# Patient Record
Sex: Male | Born: 1950
Health system: Southern US, Community
[De-identification: ages and names within clinical notes are randomized; demographics above are authoritative.]

## PROBLEM LIST (undated history)

## (undated) DIAGNOSIS — R55 Syncope and collapse: Secondary | ICD-10-CM

## (undated) DIAGNOSIS — E785 Hyperlipidemia, unspecified: Secondary | ICD-10-CM

## (undated) DIAGNOSIS — T753XXA Motion sickness, initial encounter: Secondary | ICD-10-CM

## (undated) DIAGNOSIS — I10 Essential (primary) hypertension: Secondary | ICD-10-CM

## (undated) DIAGNOSIS — I495 Sick sinus syndrome: Secondary | ICD-10-CM

## (undated) DIAGNOSIS — Z972 Presence of dental prosthetic device (complete) (partial): Secondary | ICD-10-CM

## (undated) DIAGNOSIS — Z950 Presence of cardiac pacemaker: Secondary | ICD-10-CM

## (undated) DIAGNOSIS — I4891 Unspecified atrial fibrillation: Secondary | ICD-10-CM

## (undated) HISTORY — DX: Syncope and collapse: R55

## (undated) HISTORY — DX: Sick sinus syndrome: I49.5

## (undated) HISTORY — DX: Essential (primary) hypertension: I10

## (undated) HISTORY — DX: Presence of cardiac pacemaker: Z95.0

## (undated) HISTORY — DX: Hyperlipidemia, unspecified: E78.5

---

## 2005-01-22 ENCOUNTER — Ambulatory Visit: Payer: Self-pay | Admitting: General Surgery

## 2007-07-29 ENCOUNTER — Other Ambulatory Visit: Payer: Self-pay

## 2007-07-29 ENCOUNTER — Emergency Department: Payer: Self-pay | Admitting: Internal Medicine

## 2007-07-31 ENCOUNTER — Ambulatory Visit: Payer: Self-pay | Admitting: Internal Medicine

## 2007-07-31 LAB — CONVERTED CEMR LAB
CO2: 23 meq/L (ref 19–32)
Chloride: 101 meq/L (ref 96–112)
Creatinine, Ser: 1.05 mg/dL (ref 0.40–1.50)
Free T4: 1.19 ng/dL (ref 0.89–1.80)
Potassium: 3.6 meq/L (ref 3.5–5.3)

## 2007-08-05 ENCOUNTER — Ambulatory Visit: Payer: Self-pay

## 2007-08-05 ENCOUNTER — Encounter: Payer: Self-pay | Admitting: Internal Medicine

## 2007-08-25 ENCOUNTER — Ambulatory Visit: Payer: Self-pay | Admitting: Internal Medicine

## 2007-08-28 ENCOUNTER — Ambulatory Visit: Payer: Self-pay | Admitting: Internal Medicine

## 2007-08-28 LAB — CONVERTED CEMR LAB
Basophils Absolute: 0 10*3/uL (ref 0.0–0.1)
Basophils Relative: 0 % (ref 0.0–1.0)
CO2: 29 meq/L (ref 19–32)
Calcium: 9.8 mg/dL (ref 8.4–10.5)
Chloride: 105 meq/L (ref 96–112)
Creatinine, Ser: 1 mg/dL (ref 0.4–1.5)
Eosinophils Relative: 1.3 % (ref 0.0–5.0)
Glucose, Bld: 104 mg/dL — ABNORMAL HIGH (ref 70–99)
Hemoglobin: 15.4 g/dL (ref 13.0–17.0)
INR: 1 (ref 0.8–1.0)
Lymphocytes Relative: 50.1 % — ABNORMAL HIGH (ref 12.0–46.0)
Monocytes Relative: 6.9 % (ref 3.0–12.0)
Neutro Abs: 3.9 10*3/uL (ref 1.4–7.7)
Neutrophils Relative %: 41.7 % — ABNORMAL LOW (ref 43.0–77.0)
Prothrombin Time: 12.5 s (ref 10.9–13.3)
RBC: 4.91 M/uL (ref 4.22–5.81)
WBC: 9.4 10*3/uL (ref 4.5–10.5)
aPTT: 28.3 s (ref 21.7–29.8)

## 2007-09-04 ENCOUNTER — Ambulatory Visit: Payer: Self-pay | Admitting: Internal Medicine

## 2007-09-04 ENCOUNTER — Ambulatory Visit (HOSPITAL_COMMUNITY): Admission: RE | Admit: 2007-09-04 | Discharge: 2007-09-05 | Payer: Self-pay | Admitting: Internal Medicine

## 2007-09-04 HISTORY — PX: INSERT / REPLACE / REMOVE PACEMAKER: SUR710

## 2007-09-18 ENCOUNTER — Ambulatory Visit: Payer: Self-pay

## 2007-09-30 ENCOUNTER — Ambulatory Visit: Payer: Self-pay | Admitting: Internal Medicine

## 2007-11-28 ENCOUNTER — Ambulatory Visit: Payer: Self-pay | Admitting: Internal Medicine

## 2008-01-05 ENCOUNTER — Ambulatory Visit: Payer: Self-pay | Admitting: Internal Medicine

## 2008-08-27 ENCOUNTER — Encounter (INDEPENDENT_AMBULATORY_CARE_PROVIDER_SITE_OTHER): Payer: Self-pay | Admitting: *Deleted

## 2008-09-20 ENCOUNTER — Ambulatory Visit: Payer: Self-pay | Admitting: Internal Medicine

## 2008-11-25 ENCOUNTER — Ambulatory Visit: Payer: Self-pay | Admitting: Internal Medicine

## 2008-11-25 DIAGNOSIS — E785 Hyperlipidemia, unspecified: Secondary | ICD-10-CM | POA: Insufficient documentation

## 2008-11-25 DIAGNOSIS — I10 Essential (primary) hypertension: Secondary | ICD-10-CM | POA: Insufficient documentation

## 2008-11-25 DIAGNOSIS — I495 Sick sinus syndrome: Secondary | ICD-10-CM

## 2008-11-25 DIAGNOSIS — I4891 Unspecified atrial fibrillation: Secondary | ICD-10-CM

## 2008-12-22 ENCOUNTER — Ambulatory Visit: Payer: Self-pay | Admitting: Internal Medicine

## 2008-12-22 ENCOUNTER — Encounter: Payer: Self-pay | Admitting: Internal Medicine

## 2008-12-27 ENCOUNTER — Encounter: Payer: Self-pay | Admitting: Internal Medicine

## 2009-03-29 ENCOUNTER — Encounter: Payer: Self-pay | Admitting: Internal Medicine

## 2009-04-04 ENCOUNTER — Ambulatory Visit: Payer: Self-pay | Admitting: Internal Medicine

## 2009-04-11 ENCOUNTER — Encounter: Payer: Self-pay | Admitting: Internal Medicine

## 2009-06-03 ENCOUNTER — Ambulatory Visit: Payer: Self-pay | Admitting: Internal Medicine

## 2009-07-18 ENCOUNTER — Encounter: Payer: Self-pay | Admitting: Internal Medicine

## 2009-07-26 ENCOUNTER — Ambulatory Visit: Payer: Self-pay | Admitting: Internal Medicine

## 2009-08-10 ENCOUNTER — Encounter: Payer: Self-pay | Admitting: Internal Medicine

## 2009-10-11 ENCOUNTER — Ambulatory Visit: Payer: Self-pay | Admitting: Internal Medicine

## 2009-10-11 DIAGNOSIS — Z950 Presence of cardiac pacemaker: Secondary | ICD-10-CM

## 2009-10-11 HISTORY — DX: Presence of cardiac pacemaker: Z95.0

## 2010-01-13 ENCOUNTER — Encounter: Payer: Self-pay | Admitting: Internal Medicine

## 2010-01-30 ENCOUNTER — Ambulatory Visit: Payer: Self-pay | Admitting: Internal Medicine

## 2010-02-15 ENCOUNTER — Encounter: Payer: Self-pay | Admitting: Internal Medicine

## 2010-02-24 ENCOUNTER — Ambulatory Visit: Payer: Self-pay | Admitting: Internal Medicine

## 2010-05-11 ENCOUNTER — Encounter: Payer: Self-pay | Admitting: Internal Medicine

## 2010-05-11 ENCOUNTER — Ambulatory Visit
Admission: RE | Admit: 2010-05-11 | Discharge: 2010-05-11 | Payer: Self-pay | Source: Home / Self Care | Attending: Internal Medicine | Admitting: Internal Medicine

## 2010-05-17 ENCOUNTER — Encounter (INDEPENDENT_AMBULATORY_CARE_PROVIDER_SITE_OTHER): Payer: Self-pay | Admitting: *Deleted

## 2010-06-06 NOTE — Assessment & Plan Note (Signed)
Summary: ROV/AMD   Visit Type:  Follow-up Primary Provider:  Wallie Char  CC:  Denies chest pain or shortness of breath..  History of Present Illness: [name] returns today for followup.  He is a delightful 60 year old male with a history of hypertension, atrial fibrillation c/b tachy-brady syndrome s/p pacemaker.  ETT 08/2007 with excellent exercise capacity no evidence of ischemia.  Returns for routine f/u. Doing very well. Playing softball. Walking on treadmill for 30 mins 3x/week without problems.  Denies palpitations, SOB, CP or HF symptoms. Pacer being followed in pacer clinic and telrphonically.   Current Medications (verified): 1)  Dilt-Xr 180 Mg Xr24h-Cap (Diltiazem Hcl) .... Take 1 Capsule By Mouth Once A Day 2)  Lipitor 20 Mg Tabs (Atorvastatin Calcium) .... Take One Tablet By Mouth Daily. 3)  Aspirin 81 Mg Tbec (Aspirin) .... Take One Tablet By Mouth Daily 4)  Fish Oil 1000 Mg Caps (Omega-3 Fatty Acids) .... Take 1 By Mouth Two Times A Day  Allergies (verified): No Known Drug Allergies  Past History:  Past Medical History: Last updated: 11/25/2008 Paroxysmal atrial fib c/b sick sinus syndrome s/p pacer Syncope  Hypertension Hyperlipidemia  Past Surgical History: Last updated: 09/20/2008 Pacemaker Medtronic 09/04/2007  Family History: Last updated: 09/20/2008 Mother: Diabetes, and heart problems Father: heart attack  Social History: Last updated: 09/20/2008 Tobacco Use - No.  Alcohol Use - no Regular Exercise - yes 2 children  Risk Factors: Exercise: yes (09/20/2008)  Risk Factors: Smoking Status: never (09/20/2008)  Review of Systems       As per HPI and past medical history; otherwise all systems negative.   Vital Signs:  Patient profile:   60 year old male Height:      60 inches Weight:      207 pounds BMI:     40.57 Pulse rate:   67 / minute BP sitting:   130 / 78  (left arm) Cuff size:   regular  Vitals Entered By: Bishop Dublin,  CMA (February 24, 2010 4:05 PM)  Physical Exam  General:  Well appearing. no resp difficulty HEENT: normal Neck: supple. no JVD. Carotids 2+ bilat; not bruits. No lymphadenopathy or thryomegaly appreciated. Cor: PMI nondisplaced. Regular rate & rhythm. No rubs, gallops, murmur. Lungs: clear bilaterally Abdomen: soft, nontender, nondistended. No hepatosplenomegaly. No bruits or masses. Good bowel sounds. Extremities: no cyanosis, clubbing, rash, edema Neuro: alert & orientedx3, cranial nerves grossly intact. moves all 4 extremities w/o difficulty. affect pleasant    PPM Specifications Following MD:  Arvilla Meres     PPM Vendor:  Medtronic     PPM Model Number:  [id number]     PPM Serial Number:  [id number] PPM DOI:  09/04/2007     PPM Implanting MD:  Lewayne Bunting, MD  Lead 1    Location: RA     DOI: 09/04/2007     Model #: [id number]     Serial #: [id number]     Status: active Lead 2    Location: RV     DOI: 09/04/2007     Model #: [id number]     Serial #: [id number]     Status: active   Indications:  Huston Foley; A-fib; sick sinus syndrome   PPM Follow Up Pacer Dependent:  Yes      Episodes Coumadin:  No  Parameters Mode:  DDDR+     Lower Rate Limit:  60     Upper Rate Limit:  130 Paced AV Delay:  180  Sensed AV Delay:  150  Impression & Recommendations:  Problem # 1:  SICK SINUS/ TACHY-BRADY SYNDROME (ICD-427.81) Doing well. Asymptomatic. Continue diltiazem.  Problem # 2:  ATRIAL FIBRILLATION (ICD-427.31) Maintianing SR. (CHADS2 = 1) Continue ASA.   Other Orders: EKG w/ Interpretation (93000)  Patient Instructions: 1)  Your physician recommends that you continue on your current medications as directed. Please refer to the Current Medication list given to you today. 2)  Your physician wants you to follow-up in:   12 months You will receive a reminder letter in the mail two months in advance. If you don't receive a letter, please call our office to schedule the follow-up  appointment. Prescriptions: DILT-XR 180 MG XR24H-CAP (DILTIAZEM HCL) Take 1 capsule by mouth once a day  #30 x 12   Entered by:   Benedict Needy, RN   Authorized by:   Dolores Patty, MD, [facility]   Signed by:   Benedict Needy, RN on 02/24/2010   Method used:   Electronically to        [facility] [phone]* (retail)       [address]       [place], [place]  [address]       Ph: [phone]       Fax: [phone]   RxID:   [id number]   Prevention & Chronic Care Immunizations   Influenza vaccine: Not documented    Tetanus booster: Not documented    Pneumococcal vaccine: Not documented  Colorectal Screening   Hemoccult: Not documented    Colonoscopy: Not documented  Other Screening   PSA: Not documented   Smoking status: never  (09/20/2008)  Lipids   Total Cholesterol: Not documented   LDL: Not documented   LDL Direct: Not documented   HDL: Not documented   Triglycerides: Not documented    SGOT (AST): Not documented   SGPT (ALT): Not documented   Alkaline phosphatase: Not documented   Total bilirubin: Not documented  Hypertension   Last Blood Pressure: 130 / 78  (02/24/2010)   Serum creatinine: 1.0  (08/28/2007)   Serum potassium 4.2  (08/28/2007)  Self-Management Support :    Hypertension self-management support: Not documented    Lipid self-management support: Not documented

## 2010-06-06 NOTE — Letter (Signed)
Summary: Device-Delinquent Phone Journalist, newspaper, Main Office  [address]   [place], [place] [address]   Phone: [phone]  Fax: [phone]     January 13, 2010 MRN: [id number]   BRENTTON WARDLOW [address] [place], [place]  [address]   Dear Mr. Eaves,  According to our records, you were scheduled for a device phone transmission on 01-12-2010.     We did not receive any results from this check.  If you transmitted on your scheduled day, please call us to help troubleshoot your system.  If you forgot to send your transmission, please send one upon receipt of this letter.  Thank you,   Architectural technologist [facility]

## 2010-06-06 NOTE — Letter (Signed)
Summary: Remote Device Check  [employer], Main Office  [address]   [place], [place] [address]   Phone: [phone]  Fax: [phone]     February 15, 2010 MRN: [id number]   TIMONTHY HOVATER [address] [place], [place]  [address]   Dear Mr. Alvelo,   Your remote transmission was recieved and reviewed by your physician.  All diagnostics were within normal limits for you.  __X___Your next transmission is scheduled for: 05-11-2010.  Please transmit at any time this day.  If you have a wireless device your transmission will be sent automatically.      Sincerely,  Vella Kohler

## 2010-06-06 NOTE — Cardiovascular Report (Signed)
Summary: Office Visit Remote   Office Visit Remote   Imported By: Roderic Ovens 08/12/2009 14:56:58  _____________________________________________________________________  External Attachment:    Type:   Image     Comment:   External Document

## 2010-06-06 NOTE — Assessment & Plan Note (Signed)
Summary: f1y   Visit Type:  1 yr f/u Primary Provider:  Wallie Char   History of Present Illness: [name] returns today for followup.  He is a delightful 60 year old male with a history of hypertension, atrial fibrillation c/b tachy-brady syndrome s/p pacemaker.  Returns for routine f/u.Doing very well. Not exercising as much as he was in the past. Denies palpitations, SOB, CP or HF symptoms. He remains active cutting grass and working in his yard.  Allergies: No Known Drug Allergies  Past History:  Past Medical History: Last updated: 11/25/2008 Paroxysmal atrial fib c/b sick sinus syndrome s/p pacer Syncope  Hypertension Hyperlipidemia  Past Surgical History: Last updated: 09/20/2008 Pacemaker Medtronic 09/04/2007  Review of Systems  The patient denies chest pain, syncope, dyspnea on exertion, and peripheral edema.    Physical Exam  General:  Gen: well appearing. no resp difficulty HEENT: normal Neck: supple. no JVD. Carotids 2+ bilat; not bruits. No lymphadenopathy or thryomegaly appreciated. Cor: PMI nondisplaced. Regular rate & rhythm. No rubs, gallops, murmur. Lungs: clear bilaterally with a well healed PPM incision. Abdomen: soft, nontender, nondistended. No hepatosplenomegaly. No bruits or masses. Good bowel sounds. Extremities: no cyanosis, clubbing, rash, edema Neuro: alert & orientedx3, cranial nerves grossly intact. moves all 4 extremities w/o difficulty. affect pleasant    PPM Specifications Following MD:  Arvilla Meres     PPM Vendor:  Medtronic     PPM Model Number:  [id number]     PPM Serial Number:  [id number] PPM DOI:  09/04/2007     PPM Implanting MD:  Lewayne Bunting, MD  Lead 1    Location: RA     DOI: 09/04/2007     Model #: [id number]     Serial #: [id number]     Status: active Lead 2    Location: RV     DOI: 09/04/2007     Model #: [id number]     Serial #: [id number]     Status: active   Indications:  Huston Foley; A-fib; sick sinus syndrome   PPM Follow  Up Remote Check?  No Battery Voltage:  2.79 V     Battery Est. Longevity:  11 years     Pacer Dependent:  Yes       PPM Device Measurements Atrium  Amplitude: 2.0 mV, Impedance: 532 ohms, Threshold: 0.5 V at 0.4 msec Right Ventricle  Amplitude: 16 mV, Impedance: 591 ohms, Threshold: 0.5 V at 0.4 msec  Episodes MS Episodes:  763     Percent Mode Switch:  <0.1%     Coumadin:  No Ventricular High Rate:  85     Atrial Pacing:  92.4%     Ventricular Pacing:  0.2%  Parameters Mode:  DDDR+     Lower Rate Limit:  60     Upper Rate Limit:  130 Paced AV Delay:  180     Sensed AV Delay:  150 Next Remote Date:  01/12/2010     Next Cardiology Appt Due:  10/06/2010 Tech Comments:  No parameter changes.  Device function normal.  [facility]link transmissions every 3 months.  ROV 1 year with Dr. Ladona Ridgel in [place]. Altha Harm, LPN  October 11, 6299 12:13 PM  MD Comments:  Agree with above.  Impression & Recommendations:  Problem # 1:  CARDIAC PACEMAKER IN SITU (ICD-V45.01) His pacemaker is working normally.  Will recheck in several months.  Problem # 2:  HYPERLIPIDEMIA-MIXED (ICD-272.4) Continue current meds. A low fat diet is requested. His updated medication  list for this problem includes:    Lipitor 20 Mg Tabs (Atorvastatin calcium) .Marland Kitchen... Take one tablet by mouth daily.  Problem # 3:  ATRIAL FIBRILLATION (ICD-427.31) His symptoms are well controlled.  He will remain on ASA. His updated medication list for this problem includes:    Aspirin 81 Mg Tbec (Aspirin) .Marland Kitchen... Take one tablet by mouth daily  Patient Instructions: 1)  Your physician recommends that you schedule a follow-up appointment in: 1 year 2)  Your physician recommends that you continue on your current medications as directed. Please refer to the Current Medication list given to you today.

## 2010-06-06 NOTE — Letter (Signed)
Summary: Remote Device Check  [employer], Main Office  [address]   [place], [place] [address]   Phone: [phone]  Fax: [phone]     August 10, 2009 MRN: [id number]   MAVERYCK BAHRI [address] [place], [place]  [address]   Dear Mr. Hilliker,   Your remote transmission was recieved and reviewed by your physician.  All diagnostics were within normal limits for you.    ___X___Your next office visit is scheduled for: MAY 2011 WITH DR Ladona Ridgel. Please call our office to schedule an appointment.    Sincerely,  Proofreader

## 2010-06-06 NOTE — Cardiovascular Report (Signed)
Summary: Office Visit Remote   Office Visit Remote   Imported By: Roderic Ovens 02/16/2010 11:25:10  _____________________________________________________________________  External Attachment:    Type:   Image     Comment:   External Document

## 2010-06-06 NOTE — Letter (Signed)
Summary: Device-Delinquent Phone Journalist, newspaper, Main Office  [address]   [place], [place] [address]   Phone: [phone]  Fax: [phone]     July 18, 2009 MRN: [id number]   Max Lozano [address] [place], [place]  [address]   Dear [name],  According to our records, you were scheduled for a device phone transmission on July 06, 2009.     We did not receive any results from this check.  If you transmitted on your scheduled day, please call us to help troubleshoot your system.  If you forgot to send your transmission, please send one upon receipt of this letter.  Thank you,   Architectural technologist [facility]

## 2010-06-08 NOTE — Cardiovascular Report (Signed)
Summary: Office Visit Remote   Office Visit Remote   Imported By: Roderic Ovens 05/26/2010 10:55:57  _____________________________________________________________________  External Attachment:    Type:   Image     Comment:   External Document

## 2010-06-08 NOTE — Letter (Signed)
Summary: Remote Device Check  [employer], Main Office  [address]   [place], [place] [address]   Phone: [phone]  Fax: [phone]     May 17, 2010 MRN: [id number]   Max Lozano [address] [place], [place]  [address]   Dear [name],   Your remote transmission was recieved and reviewed by your physician.  All diagnostics were within normal limits for you.  __X___Your next transmission is scheduled for:  08-10-10.  Please transmit at any time this day.  If you have a wireless device your transmission will be sent automatically.   Sincerely,  Vella Kohler

## 2010-08-10 ENCOUNTER — Encounter: Payer: Self-pay | Admitting: *Deleted

## 2010-08-15 ENCOUNTER — Encounter: Payer: Self-pay | Admitting: *Deleted

## 2010-09-01 ENCOUNTER — Ambulatory Visit (INDEPENDENT_AMBULATORY_CARE_PROVIDER_SITE_OTHER): Payer: BC Managed Care – PPO | Admitting: *Deleted

## 2010-09-01 ENCOUNTER — Other Ambulatory Visit: Payer: Self-pay | Admitting: Internal Medicine

## 2010-09-01 DIAGNOSIS — I495 Sick sinus syndrome: Secondary | ICD-10-CM

## 2010-09-01 DIAGNOSIS — I4891 Unspecified atrial fibrillation: Secondary | ICD-10-CM

## 2010-09-01 DIAGNOSIS — Z950 Presence of cardiac pacemaker: Secondary | ICD-10-CM

## 2010-09-14 NOTE — Progress Notes (Signed)
Pacer remote check  

## 2010-09-19 NOTE — Discharge Summary (Signed)
NAME:  AYUB, KIRSH NO.:  [id number]   MEDICAL RECORD NO.:  [id number]          PATIENT TYPE:  OIB   LOCATION:  6529                         FACILITY:  [facility]   PHYSICIAN:  Maple Mirza, PA   DATE OF BIRTH:  Sep 01, 1950   DATE OF ADMISSION:  09/04/2007  DATE OF DISCHARGE:  09/05/2007                               DISCHARGE SUMMARY   ALLERGIES:  This patient has no known drug allergies.   DICTATION AND TIME FOR EXAM:  Greater than 35 minutes.   FINAL DIAGNOSES:  1. Discharging day 1 status post implant of a Medtronic Adapta dual-      chamber pacemaker.  2. History of presyncope.  3. Currently atrial pacing at 60 beats per minute.  4. History of paroxysmal atrial fibrillation with rapid ventricular      rate and pauses when wearing a monitor.  5. Bradycardia on beta-blockers.   SECONDARY DIAGNOSES:  1. Dyslipidemia.  2. Hypertension.  3. Obesity with recent weight loss.   NOTE:  Ultimately may need flecainide.   PROCEDURE:  August 18, 2007, implant of a Medtronic dual-chamber  pacemaker, Ds. Lewayne Bunting.  No postprocedural complications.  The  pacemaker interrogation well postprocedure day #1.  The x-ray shows the  leads are in appropriate position.  There is no hematoma.   BRIEF HISTORY:  Mr. Voller is a 60 year old male.  He has episodes of  dizziness.  He did have an episode of syncope.  The patient does have  atrial fibrillation wearing a monitor.  These include heart rates up to  180 beats per minute.  He also has pauses of greater than 3 seconds.  This happened when the patient was on a low-dose beta-blocker.   ADDITIONAL PAST MEDICAL HISTORY:  1. Dyslipidemia.  2. Hypertension.  3. Obesity, although the patient has lost over 50 pounds by      exercising.   TREATMENT OPTIONS:  Pacemaker insertion.  The patient was to proceed.   HOSPITAL COURSE:  The patient presents electively, September 04, 2007.  He  underwent implantation of the  dual-chamber pacemaker by Dr. Ladona Ridgel.  He  has had no postprocedural complications as mentioned.  Mobility of the  left arm has been discussed with the patient.  He is asked to keep his  incision dry for the next 7 days and not to drive for the next 7 days.   MEDICATIONS AT DISCHARGE:  1. Enteric-coated aspirin 81 mg daily.  2. Niacin 500 mg twice daily.  3. Cozaar 50 mg daily.   He has to follow up at [facility] on [address]  1. Pacer clinic Thursday, Sep 18, 2007, at 9:40.  2. Dr. Gala Romney Friday, November 28, 2007, at 10:30.  3. Dr. Ladona Ridgel Tuesday, December 30, 2007, at 2:50 p.m.   LABORATORY STUDIES:  Pertinent to this admission, white cells 9.4,  hemoglobin 15.4, hematocrit 45.3, and platelets are 115,000.  Pro time  12.5, INR 1.0, sodium 130, potassium 4.2, chloride 105, carbonate 29,  glucose 104, BUN is 15, and creatinine is 1.  Maple Mirza, PA     GM/MEDQ  D:  09/05/2007  T:  09/05/2007  Job:  [id number]   cc:   Doylene Canning. Ladona Ridgel, MD  Bevelyn Buckles. Bensimhon, MD  Mila Merry

## 2010-09-19 NOTE — Progress Notes (Signed)
[facility] OFF[facility] NOTE   Max Lozano, Max Lozano                        MRN:          [id number]  DATE:09/30/2007                            DOB:          18-Oct-1950    Max Lozano returns today for follow-up.  He is a very pleasant middle-  aged male with symptomatic tachy-brady syndrome and paroxysmal atrial  fibrillation.  He returns today for follow-up.  He notes that after his  pacemaker was placed he still has some palpitations and was placed on  Cardizem by Dr. Gala Romney and returns today for additional evaluation  and treatment.  He has been stable.  He had no dizzy spells or  lightheaded spells, and he has very rare if any palpitations now that he  has started diltiazem.   MEDICATIONS:  Include:  1. Niacin 1 g twice daily.  2. Aspirin 81 daily.  3. Lisinopril 20 a day.  4. Diltiazem 180 a day.   PHYSICAL EXAMINATION:  Is notable for him being a pleasant, well-  appearing man in no distress.  Blood pressure was 140/70, the pulse 62  and regular, respirations were 18.  Weight was 200 pounds.  NECK:  Revealed no jugular venous distention.  LUNGS:  Clear bilaterally to auscultation.  No wheezes, rales or rhonchi  are present.  CARDIOVASCULAR EXAM:  Revealed a regular rate and rhythm.  Normal S1-S2.  There are no murmurs, rubs or gallops that I could appreciate.  The  pacemaker incision was healed nicely.  ABDOMINAL EXAM:  Soft, nontender.  EXTREMITIES:  Demonstrated no edema.   Interrogation of his pacemaker demonstrates a Medtronic Adapta.  The P  and R waves were 2 and 15, respectively.  The impedance 630 in the A,  699 in the V, threshold 0.5 at 0.4 in the right atrium and right  ventricle.  The battery voltage was 2.79 volts. The patient's head was  mode switched at approximately 8% of the time.  He was 94% A paced, less  than 1% V paced.  Today, we turned his outputs down to 2 at 0.4 in the  A  and 2.5 at 0.4 in the V.   IMPRESSION:  1. Paroxysmal atrial fibrillation.  2. Palpitations secondary to #1.  3. Bradycardia.  4. Status post pacemaker insertion.   DISCUSSION:  Overall, Max Lozano is stable.  His atrial fibrillation is  fairly well-controlled on Cardizem.  His CHADS score is 1.  For this  reason, I would not recommend Coumadin but do  think he should take aspirin.  I will plan to see the patient back in  the office in April of next year, sooner should he have worsening  symptoms.     Doylene Canning. Ladona Ridgel, MD  Electronically Signed    GWT/MedQ  DD: 09/30/2007  DT: 09/30/2007  Job #: [id number]

## 2010-09-19 NOTE — Progress Notes (Signed)
[facility] NOTE   Lozano, KINNAN                        MRN:          [id number]  DATE:09/20/2008                            DOB:          [birth year]    [name] returns today for a followup.  He is a very pleasant middle-  aged male with symptomatic bradycardia and paroxysmal AFib hypertension  who underwent permanent pacemaker insertion for all of the above just a  year ago.  He returns today for followup and overall he has done well.  He is about to set his 1-year wedding anniversary and going on a trip to  [place] with his wife.  The patient has very rare palpitations,  overall.  He denies chest pain, shortness of breath, or peripheral  edema.  He had no specific cardiac complaints.   MEDICATIONS:  1. Diltiazem XR 180 a day.  2. Lisinopril 20 a day.  3. Aspirin 81 a day.  4. Niaspan 1 g twice daily.   PHYSICAL EXAMINATION:  GENERAL:  He is a pleasant well-appearing man in  no distress.  VITAL SIGNS:  Blood pressure was 148/78, pulse 65 and regular, and  respirations were 18.  Weight was 208 pounds, up 3 pounds from his visit  back in the summer.  NECK:  No jugular venous distention.  LUNGS:  Clear bilaterally to auscultation.  No wheezes, rales, or  rhonchi are present.  There is no increased work of breathing.  CARDIOVASCULAR:  Regular rate and rhythm.  Normal S1 and S2.  ABDOMEN:  Soft and nontender.  There is no organomegaly.  Bowel sounds  are present.  No rebound or guarding.  EXTREMITIES:  No cyanosis, clubbing, or edema.  Pulses are 2+ and  symmetric.   His EKG demonstrates sinus rhythm with atrial pacing and LVH.   Interrogation of his pacemaker demonstrates Medtronic Adapta L.  The  pacing impedances were 479 in the atrium and 616 in the ventricle with  threshold was 0.5 at 0.4 in the A and in the RV.  The R-waves were  greater than 16.   IMPRESSION:  1.  Symptomatic bradycardia.  2. Paroxysmal atrial fibrillation with the patient having no episodes      in the last year rather than the last 6 months.  3. Hypertension.  I discussed the importance of regular exercise with      the patient.  I have      informed his pacemaker is working normally.  I have asked that he      try to lose some weight in the next year.  I will see the patient      back in 1 year for pacemaker followup.     Doylene Canning. Ladona Ridgel, MD  Electronically Signed    GWT/MedQ  DD: 09/20/2008  DT: 09/21/2008  Job #: [id number]   cc:   Mila Merry

## 2010-09-19 NOTE — Assessment & Plan Note (Signed)
[facility]                         ELECTROPHYSIOLOGY OFFICE NOTE   SHAHIEM, BEDWELL                        MRN:          [id number]  DATE:08/28/2007                            DOB:          1950/10/13    [name] is referred day by Dr. Nicholes Mango for evaluation of syncope  in the setting of atrial fibrillation and sinus node dysfunction.  The  patient is a very pleasant 60 year old man who has noted palpitations as  well as recurrent episodes of dizziness and one frank episode of  syncope.  The patient underwent exercise treadmill testing, which  demonstrated a hypertensive response to exercise and very brief episodes  of atrial tachycardia but no sustained atrial fibrillation.  The patient  was at work and he passed out, falling out and hitting the floor.  He  denied injuring himself.  Subsequently he wore a cardiac monitor, which  demonstrated periods of atrial fibrillation with a rapid ventricular  response and heart rates up to 180 beats per minute.  He also has pauses  of over 3 seconds with sinus bradycardia lasting for periods of time  with heart rates in the high 20s and low 30s.  When this occurred, the  patient had been on a low-dose beta blocker.  He continues to have  dizziness and will feel at times like he is about to pass out but does  not.   His additional past medical history is notable for dyslipidemia and  hypertension as previously noted.  He has a history of obesity and  recently has lost over 50 pounds by exercising at a gym every day.   FAMILY HISTORY:  Notable for his mother being alive but has diabetes and  heart disease.  Father is also alive with coronary disease.   SOCIAL HISTORY:  The patient is engaged to be married in approximately 1  month.  He denies tobacco or ethanol abuse.   He has no known drug allergies.   REVIEW OF SYSTEMS:  As noted in the HPI, otherwise all systems were  reviewed and found to be  negative.   PHYSICAL EXAM:  He is a pleasant, well-appearing middle-aged man in no  acute distress.  The blood pressure was 146/78, the pulse was 64 and regular,  respirations were 18, and the weight was 198 pounds.  HEENT:  Normocephalic and atraumatic.  Pupils equal and round.  The  oropharynx is moist.  Sclerae were anicteric.  NECK:  No jugular venous distention.  There was no thyromegaly.  The  trachea was midline.  The carotids are 2+ and symmetric.  LUNGS:  Clear bilaterally to auscultation.  No wheezes, rales or rhonchi  are present.  There was no increased work of breathing.  CARDIOVASCULAR:  A regular rate and rhythm with a normal S1 and S2.  The  PMI was not enlarged, nor was it laterally displaced.  ABDOMEN:  Soft, nontender, nondistended.  There was no organomegaly.  Good bowel sounds were present.  There was no rebound or guarding.  EXTREMITIES:  No cyanosis, clubbing or edema.  The pulses  were 2+ and  symmetric.  NEUROLOGIC:  Alert and oriented x3 with cranial nerves II-XII intact.  Strength was 5/5 and symmetric.   EKG demonstrates sinus rhythm with normal axis and intervals.  Review of  the patient's CardioNet monitor demonstrated paroxysmal atrial  fibrillation at rates of up to 180 beats per minute as well as sinus  bradycardia with pauses of over 3 seconds and heart rates in the 30s.   IMPRESSION:  1. Atrial fibrillation.  2. Syncope.  3. Symptomatic bradycardia with pauses.   DISCUSSION:  I discussed treatment options with the patient.  The risks,  benefits, goals and expectations of pacemaker insertion have been  discussed and he wishes to proceed with this.  He may ultimately require  flecainide therapy in conjunction with this.  The pacemaker will be  scheduled at the earliest possible convenient time.     Doylene Canning. Ladona Ridgel, MD  Electronically Signed    GWT/MedQ  DD: 08/28/2007  DT: 08/28/2007  Job #: [id number]   cc:   Mila Merry

## 2010-09-19 NOTE — Discharge Summary (Signed)
NAME:  SAMSON, RALPH NO.:  [id number]   MEDICAL RECORD NO.:  [id number]          PATIENT TYPE:  OIB   LOCATION:  6529                         FACILITY:  [facility]   PHYSICIAN:  Maple Mirza, PA   DATE OF BIRTH:  19-Feb-1951   DATE OF ADMISSION:  09/04/2007  DATE OF DISCHARGE:                               DISCHARGE SUMMARY   ADDENDUM   He will see Dr. Ladona Ridgel in [employer] office in August.  [place]  office will call with that appointment.      Maple Mirza, PA     GM/MEDQ  D:  09/05/2007  T:  09/05/2007  Job:  [id number]

## 2010-09-19 NOTE — Op Note (Signed)
NAME:  Max Lozano, BELSITO NO.:  [id number]   MEDICAL RECORD NO.:  [id number]          PATIENT TYPE:  OIB   LOCATION:  6529                         FACILITY:  [facility]   PHYSICIAN:  Doylene Canning. Ladona Ridgel, MD    DATE OF BIRTH:  May 25, 1950   DATE OF PROCEDURE:  09/04/2007  DATE OF DISCHARGE:                               OPERATIVE REPORT   ELECTROPHYSIOLOGY PROCEDURE NOTE   PROCEDURE PERFORMED:  Implantation of a dual-chamber pacemaker.   INDICATIONS:  Symptomatic tachy-brady syndrome.   INTRODUCTION:  The patient is a very pleasant middle-aged man, who has  hypertension and syncope.  He has paroxysmal atrial fibrillation, and  after a cardiac monitor was obtained, he was demonstrated to have  episodes of bradycardia with pauses of over 3 seconds, and the heart  rates in the 20s and low 30s.  With these, he has had near syncope,  dizziness, lightheadedness, and one frank episode of syncope.  His LV  function is preserved and is now referred for a pacemaker implantation.  When the patient is in AFib, his rapid ventricular response is over 140  beats per minute.   PROCEDURE:  After informed consent was obtained, the patient was taken  to diagnostic EP lab in a fasting state.  After usual preparation and  draping, intravenous fentanyl and midazolam was given for sedation.  A  30 mL of lidocaine was infiltrated in the left infraclavicular region.  A 5-cm incision was carried out over this region.  Electrocautery was  utilized to dissect down to the fascial plane.  The left subclavian vein  was then punctured x2 and the Medtronic model 5076-58-cm active-fixation  pacing lead, serial #[id number] was advanced to the right ventricle and  the Medtronic model 5076-52-cm active-fixation pacing lead, serial  #[id number] was advanced to the right atrium.  Mapping was carried out  in the right ventricle and the right atrium.  At the final site, the P  and R waves measured 2 and 10  respectively.  The impedance was 622 in  the atrium, 818 in the ventricle, and the threshold 0.7 volts at 0.5 in  the atrium and 0.6 volts at 0.5 in the right ventricle.  With these  satisfactory parameters, the lead was secured to subpectoralis fascia  with a figure-of-eight silk suture.  The sewing sleeve was also secured  with silk suture.  Electrocautery was utilized to make a subcutaneous  pocket and kanamycin irrigation was utilized to irrigate the pocket.  The Medtronic adapter dual-chamber pacemaker, serial [id number], was  connected to the atrial and ventricular leads and placed back in a  subcutaneous pocket.  Generator was secured with silk suture.  Additional kanamycin was utilized to irrigate the pocket and the  incision closed with a layer of 2-0 Vicryl followed by layer of 3-0  Vicryl.  Benzoin was painted on skin, Steri-Strips were applied, and  pressure dressings placed.  The patient  returned to his room in satisfactory condition.  There were no immediate  procedure complications.  Full results demonstrated successful  implantation of a Medtronic dual-chamber pacemaker  in a patient with  symptomatic tachy-brady syndrome.      Doylene Canning. Ladona Ridgel, MD  Electronically Signed     GWT/MEDQ  D:  09/04/2007  T:  09/05/2007  Job:  [id number]   cc:   Blanchie Dessert. Bensimhon, MD

## 2010-09-19 NOTE — Assessment & Plan Note (Signed)
[facility] OFFICE NOTE   Max Lozano, Max Lozano                        MRN:          [id number]  DATE:07/31/2007                            DOB:          12-03-1950    REFERRING PHYSICIAN:  Mila Merry   REASON FOR CONSULTATION:  Is syncope.   Max Lozano is a very pleasant 60 year old male with history of  hypertension, hyperlipidemia, and obesity status post nearly 50-pound  weight loss.  He has no known history of coronary artery disease.  He  has never had a stress test or cardiac catheterization.  He denies any  diabetes or previous history of syncope.   Apparently on Tuesday, he was at work when he was walking across the  shop, and he became quite lightheaded and then abruptly passed out and  hit his head on a machine.  He denied any chest pain or palpitations or  shortness of breath associated with this.  He was taken to [facility].  His heart rate on arrival was 38.  At that time, he was on  atenolol and chlorthalidone 50/12.5.  This was thought to be the  etiology of his syncope, and this was stopped, and he was switched to  Cozaar.  He has not had any problems since.   He is quite active as he exercises routinely as part of his weight-loss  program.  He goes to the gym at least three times a week for the past  year and half.  He walks on a treadmill at 12-15% grade at 3.5 miles an  hour for 30 minutes without any difficulty.  He also exercises on the  elliptical and does some weights.   On Monday night, he did say he was at the gym and got a very good work  out.  He, however, made sure to eat a good breakfast on Tuesday morning  and does not feel like he was particularly dehydrated.   REVIEW OF SYSTEMS:  He denies any nausea, vomiting.  No focal neurologic  defects.  No seizure activity.  No recent diarrhea, fevers, or chills.   PAST MEDICAL HISTORY:  1. Hypertension.  2. Obesity status  post 50-pound weight loss.  3. Hyperlipidemia.   CURRENT MEDICATIONS:  1. Niacin 1000 mg b.i.d.  2. Cozaar 50 a day.  3. Aspirin 81 a day.   ALLERGIES:  No known drug allergies.   SOCIAL HISTORY:  He is here with his girlfriend.  He works as a Aeronautical engineer.  He does not smoke or drink alcohol.   FAMILY HISTORY:  Mother is alive and has a history of diabetes and heart  problems.  Father is alive at 76 and has a history of heart attack and  coronary artery disease.   PHYSICAL EXAMINATION:  He is a well-appearing male in no acute distress.  He ambulates around the clinic without any respiratory difficulty.  Blood pressure was initially on sitting 162/74 with a heart rate of 56,  on standing 154/78 with heart rate of 56.  With jumping jacks,  he  quickly elevates his heart rate up to 100.  HEENT:  Is normal.  NECK:  Is supple.  There is no JVD.  Carotid are 2+ bilaterally without  bruits.  There is no lymphadenopathy or thyromegaly.  No evidence of  carotid artery hypersensitivity.  CARDIAC:  PMI is nondisplaced.  He is bradycardic and regular.  No  murmurs.  LUNGS:  Clear.  ABDOMEN:  Soft, nontender, nondistended. No hepatosplenomegaly, no  bruits, no masses.  Good bowel sounds.  EXTREMITIES:  Warm with no cyanosis, clubbing or edema.  No rash.  NEUROLOGICAL:  Alert and oriented x3.  Cranial nerves II-XII intact.  Moves all four extremities without difficulty.  Affect is pleasant.   EKG shows sinus bradycardia at a rate of 49 with a significant LVH.  There is no ST-T wave abnormalities.  QRS duration is 92 milliseconds,  QTC is 384 milliseconds.  There is no evidence of pre-excitation.   ASSESSMENT/PLAN:  1. Syncope.  I suspect this was due to bradycardia and possible mild      volume depletion exacerbated by his beta blocker and diuretic which      have been stopped.  Given his good functional capacity, I suspect      that his resting heart rate is quite low and that his  current      bradycardia does not represent chronotropic incompetence or      significant conduction disease.  However, given his age and risk      factors, I do think it is reasonable to do a little bit more of a      workup.  We will get a routine exercise treadmill test and      echocardiogram and also check a thyroid panel.  We have placed a 48-      hour Holter monitor on him to rule out any significant underlying      conduction disease.  2. Hypertension.  Blood pressure is moderately elevated.  I would      continue to titrate Cozaar as needed.  This will be done by Dr.      Sherrie Mustache.  Could add Norvasc as well as needed.  Once again, would      avoid beta blockers and diuretics in him if at all possible.   DISPOSITION:  Will see him back in a month pending the results of this  testing.  I told him to call me should he had any other episodes of  syncope.  I do think it is probably safe for him to continue driving  without restriction.     Max Buckles. Bensimhon, MD  Electronically Signed    DRB/MedQ  DD: 07/31/2007  DT: 08/01/2007  Job #: [id number]

## 2010-09-19 NOTE — Progress Notes (Signed)
[facility] OFFICE NOTE   JIBRIL, MCMINN                        MRN:          [id number]  DATE:01/05/2008                            DOB:          24-May-1950    Mr. Troy returns today for followup.  He is a very pleasant middle-aged  man with a history of hypertension, paroxysmal AFib, and symptomatic  tachy-brady, who underwent permanent pacemaker back in April.  He  returns today for followup.  He is doing well.  The patient has had very  little in the way of palpitations.  He denies chest pain.  He denies  shortness of breath.  He remains very active.   MEDICATIONS:  1. Niacin 1 g twice a day.  2. Aspirin 81 mg daily.  3. Lisinopril 20 mg daily.  4. Diltiazem 180 mg daily.   PHYSICAL EXAMINATION:  GENERAL:  He is a pleasant, well-appearing,  middle-aged man in no distress.  VITAL SIGNS:  Blood pressure was 120/68, pulse 72 and regular,  respirations were 18 and weight was 205 pounds.  NECK:  No jugular distention.  LUNGS:  Clear bilaterally to auscultation.  No wheezes, rales, or  rhonchi are present.  CARDIOVASCULAR:  Regular rate and rhythm.  Normal S1 and S2.  No  murmurs, rubs, or gallops.  ABDOMEN:  Soft, nontender.  EXTREMITIES:  No edema.  The pacemaker insertion site was healed nicely.   Interrogation of his device demonstrates a Medtronic adaptive P-waves of  4, the R-waves were 11, the impedance 524 in the A, 643 in the V.  Threshold was 0.375 at 0.4 in the atrium and 0.5 at 0.4 in the  ventricle.  He was 95% A paced.  His V paced less than 1% of the time.  He was in AFib 1.5% of the time.  The most recent episode was back in  June.   IMPRESSION:  1. Paroxysmal atrial fibrillation.  2. Syncope.  3. Bradycardia with long pauses.  4. Status post pacemaker insertion.   DISCUSSION:  Mr. Dehaas is stable and his pacemaker is working normally.  His AFib is well controlled on  his current medical therapy.  I will plan  on seeing the patient back in the office in April 2010 for pacemaker  followup.     Doylene Canning. Ladona Ridgel, MD  Electronically Signed    GWT/MedQ  DD: 01/05/2008  DT: 01/06/2008  Job #: [id number]   cc:   Mila Merry

## 2010-09-19 NOTE — Procedures (Signed)
[facility]                              EXERCISE TREADMILL   NAME:[name], [name]                        MRN:          [id number]  DATE:08/25/2007                            DOB:          07/15/1950    PRIMARY CARE PHYSICIAN:  Mila Merry   REASON FOR STRESS TEST:  Syncope and possible chronotropic incompetence.   BASELINE DATA:  EKG showed sinus bradycardia at a rate of 57 with no ST-  T wave abnormalities.  Resting blood pressure was 148/79.   EXERCISE DATA:  The patient walked for 12 minutes on a standard Bruce  protocol.  Peak heart rate was 181 which was 109% of predicted maximal  heart rate.  Peak blood pressure was 229/103.  Exercise was stopped due  to fatigue.  There was no chest pain or shortness of breath.  There was  a brisk heart rate.  Uptake with exercise and very brisk heart rate  recovery with peak heart rate dropping from 181 to 137 at 1 minute and  90 at 2 minutes.  During exercise, there were multiple brief runs of  SVT, about 5 or 6 beats.  These were asymptomatic.  There were  occasional PVCs in one couplet.  No ST-T wave abnormalities.   CONCLUSION:  1. This is a normal exercise stress test.  There is excellent exercise      tolerance with no evidence of stress-induced ischemia.  2. Excellent heart rate recovery.  3. No evidence of chronotropic incompetence.  4. Multiple brief runs of SVT which were asymptomatic.  Our longest      was about 6 beats.  5. Hypertensive response to exercise.   PLAN:  To increase his Cozaar to 50 mg b.i.d.  Will continue with an  event monitor to make sure that he does not have any prolonged episodes  of SVT to explain his syncope.  I will see him back in the office in two  months.     Bevelyn Buckles. Bensimhon, MD  Electronically Signed    DRB/MedQ  DD: 08/25/2007  DT: 08/25/2007  Job #: [id number]   cc:   Mila Merry

## 2010-09-19 NOTE — Assessment & Plan Note (Signed)
[facility] OFFICE NOTE   OWENS, HARA                        MRN:          [id number]  DATE:11/28/2007                            DOB:          Sep 18, 1950    PRIMARY CARE PHYSICIAN:  Mila Merry.   INTERVAL HISTORY:  Max Lozano is a delightful 60 year old male with a history  of hypertension and syncope.  He was found to have atrial fibrillation  with tachy-brady syndrome and symptomatic pauses.  He underwent  pacemaker placement by Dr. Ladona Ridgel.   He is doing very well.  He has back to the gym and exercising without  any difficulty.  He gets his heart rate between 95 and 126 without any  problems.  He has no chest pain or shortness of breath.  He did have  some residual palpitations.  We start him on diltiazem and those have  resolved.  I given his CHADS score of 1.  He has been maintained on just  aspirin, and not Coumadin.   CURRENT MEDICATIONS:  1. Niacin 1000 b.i.d.  2. Aspirin 81 a day.  3. Lisinopril 20 a day.  4. Diltiazem XR 180 a day.   PHYSICAL EXAMINATION:  GENERAL:  He is well appearing in no acute  distress.  Ambulatory on the clinic without any respiratory difficulty.  VITAL SIGNS:  Blood pressure is 140/74 and heart rate is 61.  HEENT:  Normal.  NECK:  Supple.  There is no JVD.  Carotids are 2+ bilaterally  without  any bruits.  There is no lymphadenopathy or thyromegaly.  CARDIAC:  PMI is nondisplaced.  Irregular rate and rhythm.  No murmurs,  rubs or gallops.  Pacemaker site looks good.  LUNGS:  Clear.  ABDOMEN:  Obese, soft, nontender, and nondistended.  No  hepatosplenomegaly.  No bruits.  No masses.  Good bowel sounds.  EXTREMITIES:  Warm.  No cyanosis, clubbing, or edema.  NEURO:  Alert and oriented x3.  Cranial nerves II-XII grossly intact.  Moves all 4 extremities without difficulty.  Affect is pleasant.   EKG shows atrial pacing at 61 beats a minute.  There is LVH.   Of  note, he has also had a stress test previously with no evidence of  ischemia.  Echocardiogram showed an EF of 55% with trivial aortic  insufficiency and mild mitral regurgitation.   ASSESSMENT AND PLAN:  1. Atrial fibrillation, complicated by tachy-brady syndrome, status      post pacemaker.  His [place] score is 1.  We will just treat him with      aspirin.  We will have him increase this to 325 a day.  He will      follow up in the Pacemaker Clinic.  2. Chronic hypertension.  Blood pressure is mildly elevated.  This is      followed by Dr. Sherrie Mustache.  I suggested perhaps going up on his      lisinopril to 40 or adding HCTZ in combination form to give him      lisinopril 20 and HCTZ 12.5.  Leave this  to Dr. Theodis Aguas      discretion.   DISPOSITION:  We will see him back in the clinic for 1 year for routine  followup.     Bevelyn Buckles. Bensimhon, MD  Electronically Signed    DRB/MedQ  DD: 11/28/2007  DT: 11/29/2007  Job #: [id number]   cc:   Mila Merry

## 2010-09-24 ENCOUNTER — Encounter: Payer: Self-pay | Admitting: *Deleted

## 2010-10-24 ENCOUNTER — Encounter: Payer: Self-pay | Admitting: Cardiovascular Disease

## 2011-01-11 ENCOUNTER — Encounter: Payer: Self-pay | Admitting: *Deleted

## 2011-02-21 ENCOUNTER — Encounter: Payer: Self-pay | Admitting: Cardiovascular Disease

## 2011-02-22 ENCOUNTER — Encounter: Payer: Self-pay | Admitting: Cardiovascular Disease

## 2011-02-22 ENCOUNTER — Ambulatory Visit (INDEPENDENT_AMBULATORY_CARE_PROVIDER_SITE_OTHER): Payer: BC Managed Care – PPO | Admitting: Cardiovascular Disease

## 2011-02-22 VITALS — BP 130/76 | HR 62 | Ht 70.0 in | Wt 211.0 lb

## 2011-02-22 DIAGNOSIS — I10 Essential (primary) hypertension: Secondary | ICD-10-CM

## 2011-02-22 DIAGNOSIS — I498 Other specified cardiac arrhythmias: Secondary | ICD-10-CM

## 2011-02-22 DIAGNOSIS — I4891 Unspecified atrial fibrillation: Secondary | ICD-10-CM

## 2011-02-22 DIAGNOSIS — I495 Sick sinus syndrome: Secondary | ICD-10-CM

## 2011-02-22 DIAGNOSIS — E785 Hyperlipidemia, unspecified: Secondary | ICD-10-CM

## 2011-02-22 DIAGNOSIS — R001 Bradycardia, unspecified: Secondary | ICD-10-CM

## 2011-02-22 MED ORDER — DILTIAZEM HCL ER 180 MG PO CP24: 180.0000 mg | ORAL_CAPSULE | Freq: Every day | ORAL | Status: DC

## 2011-02-22 NOTE — Assessment & Plan Note (Signed)
He denies any symptoms consistent with atrial fibrillation. He continues on diltiazem with reasonable blood pressure and rate/rhythm control.

## 2011-02-22 NOTE — Assessment & Plan Note (Signed)
He denies any recent tachycardia palpitations. He has not had his pacer checked in quite some time. He does have an appointment with Dr. Ladona Ridgel in one month. We have placed a call to [facility] to see if he can do a download from home.

## 2011-02-22 NOTE — Assessment & Plan Note (Signed)
Blood pressure is well controlled on today's visit. No changes made to the medications. 

## 2011-02-22 NOTE — Assessment & Plan Note (Signed)
Notes indicate a previous history of hyperlipidemia. He does not take Lipitor. We'll try to obtain his most recent lipid panel for our records.

## 2011-02-22 NOTE — Progress Notes (Signed)
   Patient ID: Max Lozano, male    DOB: 1950/09/01, 60 y.o.   MRN: [id number]  HPI Comments: [name] is a 60 year old male with a history of hypertension, atrial fibrillation c/b tachy-brady syndrome s/p pacemaker.  ETT 08/2007 with excellent exercise capacity no evidence of ischemia Who presents for routine followup.   Returns for routine f/u. Doing very well.  Walking on treadmill for 30 mins 3x/week without problems.  Denies palpitations, SOB, CP or HF symptoms.  Pacer was being followed in pacer clinic and telrphonically, last eval was one year ago? Overall he has no complaints He reports that he does not take cholesterol Medication. Previous office visits, we had Lipitor on his medication list   EKG shows AV paced rhythm with magnet, without magnet shows A sensed rhythm at rate of 62 beats per minute   Outpatient Encounter Prescriptions as of 02/22/2011  Medication Sig Dispense Refill  . aspirin 81 MG EC tablet Take 81 mg by mouth daily.        Marland Kitchen diltiazem (DILACOR XR) 180 MG 24 hr capsule Take 1 capsule (180 mg total) by mouth daily.  30 capsule  11  . fish oil-omega-3 fatty acids 1000 MG capsule Take 1 g by mouth 2 (two) times daily.        Marland Kitchen lisinopril-hydrochlorothiazide (PRINZIDE,ZESTORETIC) 20-12.5 MG per tablet Take 1 tablet by mouth daily.           Review of Systems  Constitutional: Negative.   HENT: Negative.   Eyes: Negative.   Respiratory: Negative.   Cardiovascular: Negative.   Gastrointestinal: Negative.   Musculoskeletal: Negative.   Skin: Negative.   Neurological: Negative.   Hematological: Negative.   Psychiatric/Behavioral: Negative.   All other systems reviewed and are negative.    BP 130/76  Pulse 62  Ht 5\' 10"  (1.778 m)  Wt 211 lb (95.709 kg)  BMI 30.28 kg/m2  Physical Exam  Nursing note and vitals reviewed. Constitutional: He is oriented to person, place, and time. He appears well-developed and well-nourished.  HENT:  Head: Normocephalic.    Nose: Nose normal.  Mouth/Throat: Oropharynx is clear and moist.  Eyes: Conjunctivae are normal. Pupils are equal, round, and reactive to light.  Neck: Normal range of motion. Neck supple. No JVD present.  Cardiovascular: Normal rate, regular rhythm, S1 normal, S2 normal, normal heart sounds and intact distal pulses.  Exam reveals no gallop and no friction rub.   No murmur heard. Pulmonary/Chest: Effort normal and breath sounds normal. No respiratory distress. He has no wheezes. He has no rales. He exhibits no tenderness.  Abdominal: Soft. Bowel sounds are normal. He exhibits no distension. There is no tenderness.  Musculoskeletal: Normal range of motion. He exhibits no edema and no tenderness.  Lymphadenopathy:    He has no cervical adenopathy.  Neurological: He is alert and oriented to person, place, and time. Coordination normal.  Skin: Skin is warm and dry. No rash noted. No erythema.  Psychiatric: He has a normal mood and affect. His behavior is normal. Judgment and thought content normal.           Assessment and Plan

## 2011-02-22 NOTE — Patient Instructions (Signed)
You are doing well. No medication changes were made.  Please call us if you have new issues that need to be addressed before your next appt.  The office will contact you for a follow up Appt. In 12 months  

## 2011-03-23 ENCOUNTER — Ambulatory Visit (INDEPENDENT_AMBULATORY_CARE_PROVIDER_SITE_OTHER): Payer: BC Managed Care – PPO | Admitting: Internal Medicine

## 2011-03-23 ENCOUNTER — Encounter: Payer: Self-pay | Admitting: Internal Medicine

## 2011-03-23 DIAGNOSIS — Z950 Presence of cardiac pacemaker: Secondary | ICD-10-CM

## 2011-03-23 DIAGNOSIS — I10 Essential (primary) hypertension: Secondary | ICD-10-CM

## 2011-03-23 DIAGNOSIS — I4891 Unspecified atrial fibrillation: Secondary | ICD-10-CM

## 2011-03-23 DIAGNOSIS — I495 Sick sinus syndrome: Secondary | ICD-10-CM

## 2011-03-23 LAB — PACEMAKER DEVICE OBSERVATION
AL THRESHOLD: 0.5 V
ATRIAL PACING PM: 96
BAMS-0001: 175 {beats}/min
BATTERY VOLTAGE: 2.8 V
RV LEAD AMPLITUDE: 15.67 mv

## 2011-03-23 NOTE — Assessment & Plan Note (Signed)
He is maintaining sinus rhythm. He will continue his current medical therapy

## 2011-03-23 NOTE — Assessment & Plan Note (Signed)
His pacemaker is working normally. We'll plan to recheck in several months. 

## 2011-03-23 NOTE — Assessment & Plan Note (Signed)
His blood pressure is elevated today. He admits to noncompliance. I've asked him not to miss his medication and to maintain a low-sodium diet.

## 2011-03-23 NOTE — Progress Notes (Signed)
HPI Mr. River returns today for followup. He is a pleasant 60 year old man with a history of symptomatic bradycardia, hypertension, and paroxysmal atrial fibrillation. The patient admits to having missed his medications this morning. He had to leave early for work. He denies chest pain, shortness of breath, or peripheral edema. No Known Allergies   Current Outpatient Prescriptions  Medication Sig Dispense Refill  . aspirin 81 MG EC tablet Take 81 mg by mouth daily.        Marland Kitchen diltiazem (DILACOR XR) 180 MG 24 hr capsule Take 1 capsule (180 mg total) by mouth daily.  30 capsule  11  . fish oil-omega-3 fatty acids 1000 MG capsule Take 1 g by mouth 2 (two) times daily.        Marland Kitchen lisinopril-hydrochlorothiazide (PRINZIDE,ZESTORETIC) 20-12.5 MG per tablet Take 1 tablet by mouth daily.           Past Medical History  Diagnosis Date  . Arrhythmia     Paroxysmal atrial fib   . Sick sinus syndrome     s/p pacer  . Syncope and collapse   . Hypertension   . Hyperlipidemia     ROS:   All systems reviewed and negative except as noted in the HPI.   Past Surgical History  Procedure Date  . Insert / replace / remove pacemaker 09/04/07    Medtronic     Family History  Problem Relation Age of Onset  . Diabetes Mother   . Heart attack Father      History   Social History  . Marital Status: Single    Spouse Name: N/A    Number of Children: 2  . Years of Education: N/A   Occupational History  .     Social History Main Topics  . Smoking status: Never Smoker   . Smokeless tobacco: Not on file  . Alcohol Use: No  . Drug Use: No  . Sexually Active:    Other Topics Concern  . Not on file   Social History Narrative   Pt gets regular exercise.2 children     BP 160/78  Pulse 60  Ht 5\' 10"  (1.778 m)  Wt 96.389 kg (212 lb 8 oz)  BMI 30.49 kg/m2  Physical Exam:  Well appearing  Middle-age man, NAD HEENT: Unremarkable Neck:  No JVD, no thyromegally Lymphatics:  No  adenopathy Back:  No CVA tenderness Lungs:  Clear with no wheezes, rales, or rhonchi. Well-healed pacemaker incision. HEART:  Regular rate rhythm, no murmurs, no rubs, no clicks Abd:  soft, positive bowel sounds, no organomegally, no rebound, no guarding Ext:  2 plus pulses, no edema, no cyanosis, no clubbing Skin:  No rashes no nodules Neuro:  CN II through XII intact, motor grossly intact  DEVICE  Normal device function.  See PaceArt for details.   Assess/Plan:

## 2011-06-21 ENCOUNTER — Encounter: Payer: BC Managed Care – PPO | Admitting: *Deleted

## 2011-06-25 ENCOUNTER — Encounter: Payer: Self-pay | Admitting: *Deleted

## 2011-06-27 ENCOUNTER — Ambulatory Visit (INDEPENDENT_AMBULATORY_CARE_PROVIDER_SITE_OTHER): Payer: BC Managed Care – PPO | Admitting: *Deleted

## 2011-06-27 ENCOUNTER — Encounter: Payer: Self-pay | Admitting: Internal Medicine

## 2011-06-27 DIAGNOSIS — I495 Sick sinus syndrome: Secondary | ICD-10-CM

## 2011-06-27 DIAGNOSIS — I4891 Unspecified atrial fibrillation: Secondary | ICD-10-CM

## 2011-06-27 DIAGNOSIS — Z950 Presence of cardiac pacemaker: Secondary | ICD-10-CM

## 2011-06-29 LAB — REMOTE PACEMAKER DEVICE
AL THRESHOLD: 0.375 V
BATTERY VOLTAGE: 2.79 V
RV LEAD AMPLITUDE: 22.4 mv
RV LEAD IMPEDENCE PM: 605 Ohm
VENTRICULAR PACING PM: 0

## 2011-07-05 NOTE — Progress Notes (Signed)
Remote pacer check  

## 2011-07-24 ENCOUNTER — Encounter: Payer: Self-pay | Admitting: *Deleted

## 2011-08-06 ENCOUNTER — Other Ambulatory Visit: Payer: Self-pay

## 2011-08-06 DIAGNOSIS — R001 Bradycardia, unspecified: Secondary | ICD-10-CM

## 2011-08-06 MED ORDER — DILTIAZEM HCL ER 180 MG PO CP24: 180.0000 mg | ORAL_CAPSULE | Freq: Every day | ORAL | Status: DC

## 2011-08-06 NOTE — Telephone Encounter (Signed)
.   Requested Prescriptions   Signed Prescriptions Disp Refills  . diltiazem (DILACOR XR) 180 MG 24 hr capsule 30 capsule 8    Sig: Take 1 capsule (180 mg total) by mouth daily.    Authorizing Provider: Antonieta Iba    Ordering User: Lacie Scotts   [facility].

## 2011-09-27 ENCOUNTER — Encounter: Payer: BC Managed Care – PPO | Admitting: *Deleted

## 2011-09-28 ENCOUNTER — Encounter: Payer: Self-pay | Admitting: *Deleted

## 2011-10-09 ENCOUNTER — Ambulatory Visit (INDEPENDENT_AMBULATORY_CARE_PROVIDER_SITE_OTHER): Payer: BC Managed Care – PPO | Admitting: *Deleted

## 2011-10-09 ENCOUNTER — Encounter: Payer: Self-pay | Admitting: Internal Medicine

## 2011-10-09 DIAGNOSIS — Z950 Presence of cardiac pacemaker: Secondary | ICD-10-CM

## 2011-10-09 DIAGNOSIS — I495 Sick sinus syndrome: Secondary | ICD-10-CM

## 2011-10-18 LAB — REMOTE PACEMAKER DEVICE
ATRIAL PACING PM: 92
BAMS-0001: 175 {beats}/min
VENTRICULAR PACING PM: 0

## 2011-10-29 ENCOUNTER — Encounter: Payer: Self-pay | Admitting: *Deleted

## 2012-03-07 ENCOUNTER — Encounter: Payer: Self-pay | Admitting: *Deleted

## 2012-03-10 ENCOUNTER — Ambulatory Visit (INDEPENDENT_AMBULATORY_CARE_PROVIDER_SITE_OTHER): Payer: BC Managed Care – PPO | Admitting: Cardiovascular Disease

## 2012-03-10 ENCOUNTER — Encounter: Payer: Self-pay | Admitting: Cardiovascular Disease

## 2012-03-10 VITALS — BP 124/74 | HR 66 | Ht 71.0 in | Wt 211.5 lb

## 2012-03-10 DIAGNOSIS — E785 Hyperlipidemia, unspecified: Secondary | ICD-10-CM

## 2012-03-10 DIAGNOSIS — I10 Essential (primary) hypertension: Secondary | ICD-10-CM

## 2012-03-10 DIAGNOSIS — Z950 Presence of cardiac pacemaker: Secondary | ICD-10-CM

## 2012-03-10 DIAGNOSIS — I4891 Unspecified atrial fibrillation: Secondary | ICD-10-CM

## 2012-03-10 MED ORDER — ATORVASTATIN CALCIUM 10 MG PO TABS: 10.0000 mg | ORAL_TABLET | Freq: Every day | ORAL | Status: DC

## 2012-03-10 NOTE — Assessment & Plan Note (Signed)
Blood pressure is well controlled on today's visit. No changes made to the medications. 

## 2012-03-10 NOTE — Assessment & Plan Note (Signed)
Strong family history of coronary artery disease as detailed, most notably his father with heart attack in his 65s. We have talked about his cholesterol and he prefers to be more aggressive. We'll start him on Lipitor 10 mg daily.

## 2012-03-10 NOTE — Progress Notes (Signed)
   Patient ID: Max Lozano, male    DOB: Jun 13, 1950, 61 y.o.   MRN: [id number]  HPI Comments: Max Lozano is a 61 year old male with a history of hypertension, atrial fibrillation c/b tachy-brady syndrome s/p pacemaker.  ETT 08/2007 with excellent exercise capacity no evidence of ischemia who presents for routine followup.   Returns for routine f/u. Doing very well.  Walking on treadmill for 30 mins 3x/week without problems.  Denies palpitations, SOB, CP or HF symptoms.  Pacer is being followed in pacer clinic and telephonically Overall he has no complaints  He does report having significant family history of coronary artery disease with father who had MI in his late 47s, mother with congestive heart failure in her 51s.  EKG shows normal sinus rhythm with rate 66 beats per minute with no significant ST or T wave changes   Outpatient Encounter Prescriptions as of 03/10/2012  Medication Sig Dispense Refill  . aspirin 81 MG EC tablet Take 81 mg by mouth daily.        Marland Kitchen diltiazem (DILACOR XR) 180 MG 24 hr capsule Take 1 capsule (180 mg total) by mouth daily.  30 capsule  8  . fish oil-omega-3 fatty acids 1000 MG capsule Take 1 g by mouth 2 (two) times daily.        Marland Kitchen lisinopril-hydrochlorothiazide (PRINZIDE,ZESTORETIC) 20-12.5 MG per tablet Take 1 tablet by mouth daily.          Review of Systems  Constitutional: Negative.   HENT: Negative.   Eyes: Negative.   Respiratory: Negative.   Cardiovascular: Negative.   Gastrointestinal: Negative.   Musculoskeletal: Negative.   Skin: Negative.   Neurological: Negative.   Hematological: Negative.   Psychiatric/Behavioral: Negative.   All other systems reviewed and are negative.    BP 124/74  Pulse 66  Ht 5\' 11"  (1.803 m)  Wt 211 lb 8 oz (95.936 kg)  BMI 29.50 kg/m2  Physical Exam  Nursing note and vitals reviewed. Constitutional: He is oriented to person, place, and time. He appears well-developed and well-nourished.  HENT:  Head:  Normocephalic.  Nose: Nose normal.  Mouth/Throat: Oropharynx is clear and moist.  Eyes: Conjunctivae normal are normal. Pupils are equal, round, and reactive to light.  Neck: Normal range of motion. Neck supple. No JVD present.  Cardiovascular: Normal rate, regular rhythm, S1 normal, S2 normal, normal heart sounds and intact distal pulses.  Exam reveals no gallop and no friction rub.   No murmur heard. Pulmonary/Chest: Effort normal and breath sounds normal. No respiratory distress. He has no wheezes. He has no rales. He exhibits no tenderness.  Abdominal: Soft. Bowel sounds are normal. He exhibits no distension. There is no tenderness.  Musculoskeletal: Normal range of motion. He exhibits no edema and no tenderness.  Lymphadenopathy:    He has no cervical adenopathy.  Neurological: He is alert and oriented to person, place, and time. Coordination normal.  Skin: Skin is warm and dry. No rash noted. No erythema.  Psychiatric: He has a normal mood and affect. His behavior is normal. Judgment and thought content normal.           Assessment and Plan

## 2012-03-10 NOTE — Assessment & Plan Note (Signed)
He has routine followup with Dr. Ladona Ridgel

## 2012-03-10 NOTE — Patient Instructions (Addendum)
You are doing well. Please start atorvastatin one a day for cholesterol  Please call us if you have new issues that need to be addressed before your next appt.  Your physician wants you to follow-up in: 12 months.  You will receive a reminder letter in the mail two months in advance. If you don't receive a letter, please call our office to schedule the follow-up appointment.

## 2012-03-10 NOTE — Assessment & Plan Note (Signed)
Maintaining normal sinus rhythm on today's visit. No changes made to the medications

## 2012-04-16 ENCOUNTER — Encounter: Payer: Self-pay | Admitting: *Deleted

## 2012-04-22 ENCOUNTER — Encounter: Payer: Self-pay | Admitting: Internal Medicine

## 2012-04-22 ENCOUNTER — Ambulatory Visit (INDEPENDENT_AMBULATORY_CARE_PROVIDER_SITE_OTHER): Payer: BC Managed Care – PPO | Admitting: Internal Medicine

## 2012-04-22 VITALS — BP 140/70 | HR 72 | Ht 71.0 in | Wt 216.8 lb

## 2012-04-22 DIAGNOSIS — I495 Sick sinus syndrome: Secondary | ICD-10-CM

## 2012-04-22 DIAGNOSIS — Z950 Presence of cardiac pacemaker: Secondary | ICD-10-CM

## 2012-04-22 DIAGNOSIS — I4891 Unspecified atrial fibrillation: Secondary | ICD-10-CM

## 2012-04-22 LAB — PACEMAKER DEVICE OBSERVATION
AL THRESHOLD: 0.5 V
RV LEAD AMPLITUDE: 15.67 mv

## 2012-04-22 NOTE — Assessment & Plan Note (Signed)
The patient is maintaining sinus rhythm 95% of the time. He does have palpitations. His risk for stroke is very low, and he will continue low-dose aspirin. I recommended that he take an additional diltiazem if he has worsening palpitations. Antiarrhythmic drug therapy with flecainide would be a consideration if his symptoms worsen. He is instructed to notify us if this is the case. Otherwise will see the patient in one year.

## 2012-04-22 NOTE — Assessment & Plan Note (Signed)
His Medtronic dual-chamber pacemaker is working normally. His battery longevity is approximately 10 years. He is in atrial fibrillation approximately 5% the time. He is minimally symptomatic.

## 2012-04-22 NOTE — Patient Instructions (Addendum)
Need to send in for [facility] on 07/28/2012.  Follow up with Dr. Ladona Ridgel in 12 months.

## 2012-04-22 NOTE — Progress Notes (Signed)
HPI [name] returns today for followup. He is a very pleasant 61 year old man with symptomatic bradycardia, syncope, status post permanent pacemaker insertion. He also is hypertension. In the interim, he has done well. He denies chest pain, shortness of breath, or peripheral edema. No recurrent syncope. Allergies  Allergen Reactions  . Atenolol      Current Outpatient Prescriptions  Medication Sig Dispense Refill  . aspirin 81 MG EC tablet Take 81 mg by mouth daily.        Marland Kitchen atorvastatin (LIPITOR) 10 MG tablet Take 1 tablet (10 mg total) by mouth daily.  30 tablet  6  . diltiazem (DILACOR XR) 180 MG 24 hr capsule Take 1 capsule (180 mg total) by mouth daily.  30 capsule  8  . fish oil-omega-3 fatty acids 1000 MG capsule Take 1 g by mouth 2 (two) times daily.        Marland Kitchen lisinopril-hydrochlorothiazide (PRINZIDE,ZESTORETIC) 20-12.5 MG per tablet Take 1 tablet by mouth daily.           Past Medical History  Diagnosis Date  . Arrhythmia     Paroxysmal atrial fib   . Sick sinus syndrome     s/p pacer  . Syncope and collapse   . Hypertension   . Hyperlipidemia     ROS:   All systems reviewed and negative except as noted in the HPI.   Past Surgical History  Procedure Date  . Insert / replace / remove pacemaker 09/04/07    Medtronic     Family History  Problem Relation Age of Onset  . Diabetes Mother   . Heart attack Father      History   Social History  . Marital Status: Single    Spouse Name: N/A    Number of Children: 2  . Years of Education: N/A   Occupational History  .     Social History Main Topics  . Smoking status: Never Smoker   . Smokeless tobacco: Not on file  . Alcohol Use: No  . Drug Use: No  . Sexually Active:    Other Topics Concern  . Not on file   Social History Narrative   Pt gets regular exercise.2 children     BP 140/70  Pulse 72  Ht 5\' 11"  (1.803 m)  Wt 216 lb 12 oz (98.317 kg)  BMI 30.23 kg/m2  Physical Exam:  Well  appearing middle-aged man, NAD HEENT: Unremarkable Neck:  no JVD, no thyromegally Lungs:  Clear with no wheezes, rales, or rhonchi. HEART:  Regular rate rhythm, no murmurs, no rubs, no clicks Abd:  soft, positive bowel sounds, no organomegally, no rebound, no guarding Ext:  2 plus pulses, no edema, no cyanosis, no clubbing Skin:  No rashes no nodules Neuro:  CN II through XII intact, motor grossly intact   DEVICE  Normal device function.  See PaceArt for details.   Assess/Plan:

## 2012-06-23 ENCOUNTER — Other Ambulatory Visit: Payer: Self-pay

## 2012-06-23 DIAGNOSIS — R001 Bradycardia, unspecified: Secondary | ICD-10-CM

## 2012-06-23 MED ORDER — DILTIAZEM HCL ER 180 MG PO CP24: 180.0000 mg | ORAL_CAPSULE | Freq: Every day | ORAL | Status: DC

## 2012-06-23 NOTE — Telephone Encounter (Signed)
Refill sent for diltiazem 180 mg 

## 2012-07-28 ENCOUNTER — Encounter: Payer: BC Managed Care – PPO | Admitting: *Deleted

## 2012-08-07 ENCOUNTER — Encounter: Payer: Self-pay | Admitting: *Deleted

## 2012-08-18 ENCOUNTER — Ambulatory Visit (INDEPENDENT_AMBULATORY_CARE_PROVIDER_SITE_OTHER): Payer: BC Managed Care – PPO | Admitting: *Deleted

## 2012-08-18 ENCOUNTER — Other Ambulatory Visit: Payer: Self-pay | Admitting: Internal Medicine

## 2012-08-18 ENCOUNTER — Encounter: Payer: Self-pay | Admitting: Internal Medicine

## 2012-08-18 DIAGNOSIS — Z950 Presence of cardiac pacemaker: Secondary | ICD-10-CM

## 2012-08-18 DIAGNOSIS — I495 Sick sinus syndrome: Secondary | ICD-10-CM

## 2012-08-28 LAB — REMOTE PACEMAKER DEVICE
AL IMPEDENCE PM: 418 Ohm
AL THRESHOLD: 0.5 V
ATRIAL PACING PM: 95
BAMS-0001: 175 {beats}/min
RV LEAD THRESHOLD: 0.5 V

## 2012-09-25 ENCOUNTER — Encounter: Payer: Self-pay | Admitting: *Deleted

## 2012-11-24 ENCOUNTER — Encounter: Payer: BC Managed Care – PPO | Admitting: *Deleted

## 2012-11-25 ENCOUNTER — Encounter: Payer: Self-pay | Admitting: *Deleted

## 2012-11-28 ENCOUNTER — Ambulatory Visit (INDEPENDENT_AMBULATORY_CARE_PROVIDER_SITE_OTHER): Payer: BC Managed Care – PPO | Admitting: *Deleted

## 2012-11-28 DIAGNOSIS — Z950 Presence of cardiac pacemaker: Secondary | ICD-10-CM

## 2012-11-28 DIAGNOSIS — I495 Sick sinus syndrome: Secondary | ICD-10-CM

## 2012-11-29 ENCOUNTER — Encounter: Payer: Self-pay | Admitting: Internal Medicine

## 2012-12-04 ENCOUNTER — Encounter: Payer: Self-pay | Admitting: *Deleted

## 2012-12-11 LAB — REMOTE PACEMAKER DEVICE
AL IMPEDENCE PM: 411 Ohm
ATRIAL PACING PM: 95
BATTERY VOLTAGE: 2.79 V
RV LEAD IMPEDENCE PM: 550 Ohm
RV LEAD THRESHOLD: 0.5 V

## 2012-12-26 ENCOUNTER — Encounter: Payer: Self-pay | Admitting: *Deleted

## 2013-01-07 ENCOUNTER — Other Ambulatory Visit: Payer: Self-pay | Admitting: *Deleted

## 2013-01-07 DIAGNOSIS — R001 Bradycardia, unspecified: Secondary | ICD-10-CM

## 2013-01-07 MED ORDER — ATORVASTATIN CALCIUM 10 MG PO TABS: 10.0000 mg | ORAL_TABLET | Freq: Every day | ORAL | Status: DC

## 2013-01-07 MED ORDER — DILTIAZEM HCL ER 180 MG PO CP24: 180.0000 mg | ORAL_CAPSULE | Freq: Every day | ORAL | Status: DC

## 2013-01-07 NOTE — Telephone Encounter (Signed)
[facility] 

## 2013-01-07 NOTE — Telephone Encounter (Signed)
Refilled [facility]atin [facility].

## 2013-03-02 ENCOUNTER — Encounter: Payer: BC Managed Care – PPO | Admitting: *Deleted

## 2013-03-06 ENCOUNTER — Encounter: Payer: Self-pay | Admitting: *Deleted

## 2013-04-17 ENCOUNTER — Other Ambulatory Visit: Payer: Self-pay | Admitting: *Deleted

## 2013-04-17 DIAGNOSIS — R001 Bradycardia, unspecified: Secondary | ICD-10-CM

## 2013-04-17 MED ORDER — DILTIAZEM HCL ER 180 MG PO CP24: 180.0000 mg | ORAL_CAPSULE | Freq: Every day | ORAL | Status: DC

## 2013-04-17 NOTE — Telephone Encounter (Signed)
Requested Prescriptions   Signed Prescriptions Disp Refills  . diltiazem (DILACOR XR) 180 MG 24 hr capsule 30 capsule 6    Sig: Take 1 capsule (180 mg total) by mouth daily.    Authorizing Provider: Marinus Maw    Ordering User: Kendrick Fries

## 2013-04-23 ENCOUNTER — Encounter: Payer: Self-pay | Admitting: Internal Medicine

## 2013-04-23 ENCOUNTER — Ambulatory Visit (INDEPENDENT_AMBULATORY_CARE_PROVIDER_SITE_OTHER): Payer: BC Managed Care – PPO | Admitting: Internal Medicine

## 2013-04-23 VITALS — BP 140/68 | HR 63 | Ht 71.0 in | Wt 213.5 lb

## 2013-04-23 DIAGNOSIS — I495 Sick sinus syndrome: Secondary | ICD-10-CM

## 2013-04-23 DIAGNOSIS — I10 Essential (primary) hypertension: Secondary | ICD-10-CM

## 2013-04-23 DIAGNOSIS — Z950 Presence of cardiac pacemaker: Secondary | ICD-10-CM

## 2013-04-23 DIAGNOSIS — I4891 Unspecified atrial fibrillation: Secondary | ICD-10-CM

## 2013-04-23 LAB — MDC_IDC_ENUM_SESS_TYPE_INCLINIC
Brady Statistic AP VS Percent: 95 %
Brady Statistic AS VP Percent: 0 %
Brady Statistic AS VS Percent: 5 %
Date Time Interrogation Session: 20141218101326
Lead Channel Impedance Value: 549 Ohm
Lead Channel Pacing Threshold Amplitude: 0.5 V
Lead Channel Pacing Threshold Amplitude: 0.5 V
Lead Channel Pacing Threshold Pulse Width: 0.4 ms
Lead Channel Pacing Threshold Pulse Width: 0.4 ms
Lead Channel Sensing Intrinsic Amplitude: 15.67 mV
Lead Channel Setting Sensing Sensitivity: 5.6 mV
Miscellaneous Comment: 1:1 {titer}

## 2013-04-23 NOTE — Assessment & Plan Note (Signed)
The patient's device was interrogated.  The information was reviewed. No changes were made in the programming.    

## 2013-04-23 NOTE — Patient Instructions (Signed)
Your physician recommends that you schedule a follow-up appointment in: 3 months. Your physician recommends that you continue on your current medications as directed. Please refer to the Current Medication list given to you today. 

## 2013-04-23 NOTE — Assessment & Plan Note (Signed)
contienue with afib CHADSVASC score is 1 but he has carotid bruits and is scheduled for ultrasound this week and if positive would got oto a score of 2 and thus would be appropriately changed from asa>>NOAC

## 2013-04-23 NOTE — Assessment & Plan Note (Signed)
Stable and without exercise intolerance

## 2013-04-23 NOTE — Progress Notes (Signed)
      Patient Care Team: Mila Merry, MD as PCP - General (Family Medicine)   HPI  Max Lozano is a 62 y.o. male With pacer implanted for symptomatic bradycardia and syncope  Past Medical History  Diagnosis Date  . Arrhythmia     Paroxysmal atrial fib   . Sick sinus syndrome     s/p pacer  . Syncope and collapse   . Hypertension   . Hyperlipidemia     Past Surgical History  Procedure Laterality Date  . Insert / replace / remove pacemaker  09/04/07    Medtronic    Current Outpatient Prescriptions  Medication Sig Dispense Refill  . aspirin 81 MG EC tablet Take 81 mg by mouth daily.        Marland Kitchen atorvastatin (LIPITOR) 10 MG tablet Take 1 tablet (10 mg total) by mouth daily.  30 tablet  6  . diltiazem (DILACOR XR) 180 MG 24 hr capsule Take 1 capsule (180 mg total) by mouth daily.  30 capsule  6  . fish oil-omega-3 fatty acids 1000 MG capsule Take 1 g by mouth 2 (two) times daily.        Marland Kitchen lisinopril-hydrochlorothiazide (PRINZIDE,ZESTORETIC) 20-12.5 MG per tablet Take 1 tablet by mouth daily.         No current facility-administered medications for this visit.    Allergies  Allergen Reactions  . Atenolol     Review of Systems negative except from HPI and PMH  Physical Exam BP 140/68  Ht 5\' 11"  (1.803 m)  Wt 213 lb 8 oz (96.843 kg)  BMI 29.79 kg/m2 Well developed and well nourished in no acute distress HENT normal E scleral and icterus clear Neck Supple JVP flat; carotids brisk and full Clear to ausculation  Device pocket well healed; without hematoma or erythema.  There is no tethering Regular rate and rhythm, no murmurs gallops or rub Soft with active bowe no  Edema Alert and oriented, grossly normal motor and sensory function Skin Warm and Dry    Assessment and  Plan

## 2013-04-23 NOTE — Assessment & Plan Note (Signed)
reasoablably ocntrlled

## 2013-05-01 ENCOUNTER — Ambulatory Visit: Payer: Self-pay | Admitting: Family Medicine

## 2013-05-15 ENCOUNTER — Telehealth: Payer: Self-pay | Admitting: *Deleted

## 2013-05-15 NOTE — Telephone Encounter (Signed)
Patient called wanting results of carotid doppler. Thanks

## 2013-05-18 NOTE — Telephone Encounter (Signed)
Patient wanted to let Dr. Graciela HusbandsKlein know that his carotid ultrasound " checked out ok. I only had a few small blockages, but they are going to leave it alone for now."

## 2013-07-28 ENCOUNTER — Encounter: Payer: Self-pay | Admitting: Internal Medicine

## 2013-07-28 ENCOUNTER — Ambulatory Visit (INDEPENDENT_AMBULATORY_CARE_PROVIDER_SITE_OTHER): Payer: BC Managed Care – PPO | Admitting: Internal Medicine

## 2013-07-28 VITALS — BP 140/70 | HR 62 | Ht 70.0 in | Wt 212.0 lb

## 2013-07-28 DIAGNOSIS — I495 Sick sinus syndrome: Secondary | ICD-10-CM

## 2013-07-28 DIAGNOSIS — I4891 Unspecified atrial fibrillation: Secondary | ICD-10-CM

## 2013-07-28 LAB — MDC_IDC_ENUM_SESS_TYPE_INCLINIC
Battery Impedance: 354 Ohm
Battery Remaining Longevity: 99 mo
Battery Voltage: 2.79 V
Brady Statistic AS VP Percent: 0 %
Date Time Interrogation Session: 20150324083337
Lead Channel Impedance Value: 440 Ohm
Lead Channel Pacing Threshold Amplitude: 0.5 V
Lead Channel Pacing Threshold Amplitude: 0.5 V
Lead Channel Sensing Intrinsic Amplitude: 1.4 mV
Lead Channel Setting Pacing Amplitude: 2.5 V
Lead Channel Setting Pacing Pulse Width: 0.4 ms
MDC IDC MSMT LEADCHNL RA PACING THRESHOLD PULSEWIDTH: 0.4 ms
MDC IDC MSMT LEADCHNL RV IMPEDANCE VALUE: 534 Ohm
MDC IDC MSMT LEADCHNL RV PACING THRESHOLD PULSEWIDTH: 0.4 ms
MDC IDC MSMT LEADCHNL RV SENSING INTR AMPL: 15.67 mV
MDC IDC SET LEADCHNL RA PACING AMPLITUDE: 2 V
MDC IDC SET LEADCHNL RV SENSING SENSITIVITY: 5.6 mV
MDC IDC STAT BRADY AP VP PERCENT: 0 %
MDC IDC STAT BRADY AP VS PERCENT: 94 %
MDC IDC STAT BRADY AS VS PERCENT: 6 %

## 2013-07-28 NOTE — Progress Notes (Signed)
      Patient Care Team: Mila Merryonald Fisher, MD as PCP - General (Family Medicine)   HPI  Max Lozano L Goughnour is a 63 y.o. male Has hx of pacer implanted for symptomatic bradycardia and atrial fibrillaton CHADSVASC=1 so EF to be measured  Past Medical History  Diagnosis Date  . Arrhythmia     Paroxysmal atrial fib   . Sick sinus syndrome     s/p pacer  . Syncope and collapse   . Hypertension   . Hyperlipidemia   . PPM-Medtronic 10/11/2009    Qualifier: Diagnosis of  By: Ladona Ridgelaylor, MD, Jerrell MylarFACC, Gregg William     Past Surgical History  Procedure Laterality Date  . Insert / replace / remove pacemaker  09/04/07    Medtronic    Current Outpatient Prescriptions  Medication Sig Dispense Refill  . aspirin 81 MG EC tablet Take 81 mg by mouth daily.        Marland Kitchen. atorvastatin (LIPITOR) 10 MG tablet Take 1 tablet (10 mg total) by mouth daily.  30 tablet  6  . diltiazem (DILACOR XR) 180 MG 24 hr capsule Take 1 capsule (180 mg total) by mouth daily.  30 capsule  6  . fish oil-omega-3 fatty acids 1000 MG capsule Take 1 g by mouth 2 (two) times daily.        Marland Kitchen. lisinopril-hydrochlorothiazide (PRINZIDE,ZESTORETIC) 20-12.5 MG per tablet Take 1 tablet by mouth daily.         No current facility-administered medications for this visit.    Allergies  Allergen Reactions  . Atenolol     Review of Systems negative except from HPI and PMH  Physical Exam BP 140/70  Pulse 62  Ht 5\' 10"  (1.778 m)  Wt 212 lb (96.163 kg)  BMI 30.42 kg/m2 Well developed and nourished in no acute distress HENT normal Neck supple with JVP-flat Clear Regular rate and rhythm, no murmurs or gallops Abd-soft with active BS No Clubbing cyanosis edema Skin-warm and dry A & Oriented  Grossly normal sensory and motor function    Assessment and  Plan  Sinus node dysfunction  Paroxysmal atrial fibrillation  Pacemaker-Medtronic  Hypertension  Patient has a CHADS-VASc score of 1 with hypertension. I suggested that he  discontinue his aspirin. I will defer that final decision in Dr. Sherrie MustacheFisher. At this point there is no indication for anticoagulation. Blood pressure is reasonably controlled.

## 2013-07-28 NOTE — Patient Instructions (Signed)
Remote monitoring is used to monitor your Pacemaker of ICD from home. This monitoring reduces the number of office visits required to check your device to one time per year. It allows us to keep an eye on the functioning of your device to ensure it is working properly. You are scheduled for a device check from home on 10/29/13. You may send your transmission at any time that day. If you have a wireless device, the transmission will be sent automatically. After your physician reviews your transmission, you will receive a postcard with your next transmission date.   Your physician wants you to follow-up in: In January 2016 with Dr. Graciela HusbandsKlein. You will receive a reminder letter in the mail two months in advance. If you don't receive a letter, please call our office to schedule the follow-up appointment.

## 2013-10-29 ENCOUNTER — Telehealth: Payer: Self-pay | Admitting: Cardiology

## 2013-10-29 ENCOUNTER — Encounter: Payer: BC Managed Care – PPO | Admitting: *Deleted

## 2013-10-29 NOTE — Telephone Encounter (Signed)
Attempted to call pt on both home and cell phone numbers. There was no answer on home phone and cell was unavailable. Trying to call pt and remind pt of remote transmission.

## 2013-10-30 ENCOUNTER — Encounter: Payer: Self-pay | Admitting: Cardiology

## 2013-11-04 ENCOUNTER — Ambulatory Visit (INDEPENDENT_AMBULATORY_CARE_PROVIDER_SITE_OTHER): Payer: BC Managed Care – PPO | Admitting: *Deleted

## 2013-11-04 DIAGNOSIS — I495 Sick sinus syndrome: Secondary | ICD-10-CM

## 2013-11-05 NOTE — Progress Notes (Signed)
Remote pacemaker transmission.   

## 2013-11-09 ENCOUNTER — Other Ambulatory Visit: Payer: Self-pay

## 2013-11-09 MED ORDER — ATORVASTATIN CALCIUM 10 MG PO TABS: 10.0000 mg | ORAL_TABLET | Freq: Every day | ORAL | Status: DC

## 2013-11-13 LAB — MDC_IDC_ENUM_SESS_TYPE_REMOTE
Battery Voltage: 2.79 V
Brady Statistic AP VS Percent: 96 %
Brady Statistic AS VS Percent: 4 %
Lead Channel Impedance Value: 557 Ohm
Lead Channel Pacing Threshold Amplitude: 0.5 V
Lead Channel Pacing Threshold Pulse Width: 0.4 ms
Lead Channel Pacing Threshold Pulse Width: 0.4 ms
Lead Channel Setting Pacing Pulse Width: 0.4 ms
Lead Channel Setting Sensing Sensitivity: 5.6 mV
MDC IDC MSMT BATTERY IMPEDANCE: 379 Ohm
MDC IDC MSMT BATTERY REMAINING LONGEVITY: 97 mo
MDC IDC MSMT LEADCHNL RA IMPEDANCE VALUE: 473 Ohm
MDC IDC MSMT LEADCHNL RA PACING THRESHOLD AMPLITUDE: 0.5 V
MDC IDC MSMT LEADCHNL RV SENSING INTR AMPL: 16 mV
MDC IDC SESS DTM: 20150701185141
MDC IDC SET LEADCHNL RA PACING AMPLITUDE: 2 V
MDC IDC SET LEADCHNL RV PACING AMPLITUDE: 2.5 V
MDC IDC STAT BRADY AP VP PERCENT: 0 %
MDC IDC STAT BRADY AS VP PERCENT: 0 %

## 2013-12-01 ENCOUNTER — Other Ambulatory Visit: Payer: Self-pay

## 2013-12-01 DIAGNOSIS — R001 Bradycardia, unspecified: Secondary | ICD-10-CM

## 2013-12-01 MED ORDER — DILTIAZEM HCL ER 180 MG PO CP24: 180.0000 mg | ORAL_CAPSULE | Freq: Every day | ORAL | Status: DC

## 2013-12-01 NOTE — Telephone Encounter (Signed)
Refill sent for diltiazem

## 2013-12-09 ENCOUNTER — Encounter: Payer: Self-pay | Admitting: Cardiology

## 2013-12-14 ENCOUNTER — Encounter: Payer: Self-pay | Admitting: Internal Medicine

## 2014-02-08 ENCOUNTER — Encounter: Payer: BC Managed Care – PPO | Admitting: *Deleted

## 2014-02-10 ENCOUNTER — Encounter: Payer: Self-pay | Admitting: Cardiology

## 2014-02-26 ENCOUNTER — Encounter: Payer: Self-pay | Admitting: Internal Medicine

## 2014-02-26 ENCOUNTER — Ambulatory Visit (INDEPENDENT_AMBULATORY_CARE_PROVIDER_SITE_OTHER): Payer: BC Managed Care – PPO | Admitting: *Deleted

## 2014-02-26 DIAGNOSIS — I495 Sick sinus syndrome: Secondary | ICD-10-CM

## 2014-02-26 NOTE — Progress Notes (Signed)
Remote pacemaker transmission.   

## 2014-03-03 LAB — MDC_IDC_ENUM_SESS_TYPE_REMOTE
Battery Impedance: 477 Ohm
Brady Statistic AP VP Percent: 0 %
Brady Statistic AP VS Percent: 93 %
Brady Statistic AS VP Percent: 0 %
Brady Statistic AS VS Percent: 7 %
Date Time Interrogation Session: 20151023120458
Lead Channel Impedance Value: 492 Ohm
Lead Channel Impedance Value: 527 Ohm
Lead Channel Pacing Threshold Amplitude: 0.5 V
Lead Channel Pacing Threshold Pulse Width: 0.4 ms
Lead Channel Setting Pacing Amplitude: 2.5 V
MDC IDC MSMT BATTERY REMAINING LONGEVITY: 89 mo
MDC IDC MSMT BATTERY VOLTAGE: 2.79 V
MDC IDC MSMT LEADCHNL RV PACING THRESHOLD AMPLITUDE: 0.625 V
MDC IDC MSMT LEADCHNL RV PACING THRESHOLD PULSEWIDTH: 0.4 ms
MDC IDC MSMT LEADCHNL RV SENSING INTR AMPL: 16 mV
MDC IDC SET LEADCHNL RA PACING AMPLITUDE: 2 V
MDC IDC SET LEADCHNL RV PACING PULSEWIDTH: 0.4 ms
MDC IDC SET LEADCHNL RV SENSING SENSITIVITY: 5.6 mV

## 2014-05-21 ENCOUNTER — Other Ambulatory Visit: Payer: Self-pay

## 2014-05-21 MED ORDER — ATORVASTATIN CALCIUM 10 MG PO TABS: 10.0000 mg | ORAL_TABLET | Freq: Every day | ORAL | Status: DC

## 2014-05-21 NOTE — Telephone Encounter (Signed)
Refill sent for atorvastatin 10 mg  

## 2014-05-31 ENCOUNTER — Ambulatory Visit (INDEPENDENT_AMBULATORY_CARE_PROVIDER_SITE_OTHER): Payer: BLUE CROSS/BLUE SHIELD | Admitting: *Deleted

## 2014-05-31 ENCOUNTER — Telehealth: Payer: Self-pay | Admitting: Cardiology

## 2014-05-31 NOTE — Telephone Encounter (Signed)
Attempted to confirm remote transmission with pt. No answer and was unable to leave a message.   

## 2014-06-01 ENCOUNTER — Encounter: Payer: Self-pay | Admitting: Cardiology

## 2014-06-07 ENCOUNTER — Other Ambulatory Visit: Payer: Self-pay | Admitting: Internal Medicine

## 2014-06-07 ENCOUNTER — Encounter: Payer: Self-pay | Admitting: Internal Medicine

## 2014-06-07 DIAGNOSIS — I495 Sick sinus syndrome: Secondary | ICD-10-CM

## 2014-06-08 LAB — MDC_IDC_ENUM_SESS_TYPE_REMOTE
Battery Remaining Longevity: 87 mo
Brady Statistic AP VP Percent: 0 %
Brady Statistic AP VS Percent: 95 %
Brady Statistic AS VP Percent: 0 %
Lead Channel Impedance Value: 497 Ohm
Lead Channel Pacing Threshold Amplitude: 0.75 V
Lead Channel Pacing Threshold Pulse Width: 0.4 ms
Lead Channel Setting Pacing Amplitude: 2 V
MDC IDC MSMT BATTERY IMPEDANCE: 503 Ohm
MDC IDC MSMT BATTERY VOLTAGE: 2.79 V
MDC IDC MSMT LEADCHNL RA IMPEDANCE VALUE: 517 Ohm
MDC IDC MSMT LEADCHNL RA PACING THRESHOLD AMPLITUDE: 0.5 V
MDC IDC MSMT LEADCHNL RV PACING THRESHOLD PULSEWIDTH: 0.4 ms
MDC IDC MSMT LEADCHNL RV SENSING INTR AMPL: 16 mV
MDC IDC SESS DTM: 20160201232902
MDC IDC SET LEADCHNL RV PACING AMPLITUDE: 2.5 V
MDC IDC SET LEADCHNL RV PACING PULSEWIDTH: 0.4 ms
MDC IDC SET LEADCHNL RV SENSING SENSITIVITY: 5.6 mV
MDC IDC STAT BRADY AS VS PERCENT: 5 %

## 2014-06-08 NOTE — Progress Notes (Signed)
Remote pacemaker transmission.   

## 2014-06-15 ENCOUNTER — Encounter: Payer: Self-pay | Admitting: Cardiology

## 2014-06-25 ENCOUNTER — Other Ambulatory Visit: Payer: Self-pay | Admitting: Internal Medicine

## 2014-08-16 ENCOUNTER — Inpatient Hospital Stay
Admit: 2014-08-16 | Disposition: A | Discharge status: Home / Self Care | Payer: Self-pay | Attending: Internal Medicine | Admitting: Internal Medicine

## 2014-08-16 LAB — BASIC METABOLIC PANEL
Anion Gap: 9 (ref 7–16)
BUN: 22 mg/dL — ABNORMAL HIGH
CALCIUM: 8.7 mg/dL — AB
CREATININE: 0.95 mg/dL
Chloride: 102 mmol/L
Co2: 27 mmol/L
EGFR (African American): >60
EGFR (Non-African Amer.): >60
Glucose: 136 mg/dL — ABNORMAL HIGH
Potassium: 4.1 mmol/L
Sodium: 138 mmol/L

## 2014-08-16 LAB — CBC
HCT: 43.7 % (ref 40.0–52.0)
HGB: 14.1 g/dL (ref 13.0–18.0)
MCH: 29.9 pg (ref 26.0–34.0)
MCHC: 32.3 g/dL (ref 32.0–36.0)
MCV: 92 fL (ref 80–100)
Platelet: 91 10*3/uL — ABNORMAL LOW (ref 150–440)
RBC: 4.74 10*6/uL (ref 4.40–5.90)
RDW: 14.6 % — ABNORMAL HIGH (ref 11.5–14.5)
WBC: 4.5 10*3/uL (ref 3.8–10.6)

## 2014-08-16 LAB — TROPONIN I: Troponin-I: <0.03 ng/mL

## 2014-08-16 LAB — RAPID INFLUENZA A&B ANTIGENS

## 2014-08-16 LAB — PRO B NATRIURETIC PEPTIDE: B-Type Natriuretic Peptide: 47 pg/mL

## 2014-08-17 LAB — CBC WITH DIFFERENTIAL/PLATELET
BASOS PCT: 0.1 %
Basophil #: 0 10*3/uL (ref 0.0–0.1)
EOS ABS: 0 10*3/uL (ref 0.0–0.7)
EOS PCT: 0 %
HCT: 40.2 % (ref 40.0–52.0)
HGB: 13.4 g/dL (ref 13.0–18.0)
LYMPHS PCT: 40.7 %
Lymphocyte #: 1.5 10*3/uL (ref 1.0–3.6)
MCH: 30.5 pg (ref 26.0–34.0)
MCHC: 33.4 g/dL (ref 32.0–36.0)
MCV: 91 fL (ref 80–100)
Monocyte #: 0.3 x10 3/mm (ref 0.2–1.0)
Monocyte %: 7.8 %
Neutrophil #: 1.9 10*3/uL (ref 1.4–6.5)
Neutrophil %: 51.4 %
Platelet: 96 10*3/uL — ABNORMAL LOW (ref 150–440)
RBC: 4.4 10*6/uL (ref 4.40–5.90)
RDW: 14.4 % (ref 11.5–14.5)
WBC: 3.7 10*3/uL — ABNORMAL LOW (ref 3.8–10.6)

## 2014-08-17 LAB — BASIC METABOLIC PANEL
Anion Gap: 5 — ABNORMAL LOW (ref 7–16)
BUN: 20 mg/dL
Calcium, Total: 8.1 mg/dL — ABNORMAL LOW
Chloride: 104 mmol/L
Co2: 29 mmol/L
Creatinine: 0.81 mg/dL
Glucose: 168 mg/dL — ABNORMAL HIGH
POTASSIUM: 4 mmol/L
SODIUM: 138 mmol/L

## 2014-08-21 LAB — CULTURE, BLOOD (SINGLE)

## 2014-08-23 ENCOUNTER — Ambulatory Visit
Admit: 2014-08-23 | Disposition: A | Discharge status: Home / Self Care | Payer: Self-pay | Attending: Family Medicine | Admitting: Family Medicine

## 2014-08-27 ENCOUNTER — Ambulatory Visit
Admit: 2014-08-27 | Disposition: A | Discharge status: Home / Self Care | Payer: Self-pay | Attending: Family Medicine | Admitting: Family Medicine

## 2014-08-31 ENCOUNTER — Encounter: Payer: Self-pay | Admitting: Internal Medicine

## 2014-08-31 ENCOUNTER — Ambulatory Visit (INDEPENDENT_AMBULATORY_CARE_PROVIDER_SITE_OTHER): Payer: BLUE CROSS/BLUE SHIELD | Admitting: Internal Medicine

## 2014-08-31 VITALS — BP 128/68 | HR 67 | Ht 70.0 in | Wt 172.8 lb

## 2014-08-31 DIAGNOSIS — Z950 Presence of cardiac pacemaker: Secondary | ICD-10-CM

## 2014-08-31 DIAGNOSIS — I4891 Unspecified atrial fibrillation: Secondary | ICD-10-CM

## 2014-08-31 DIAGNOSIS — I495 Sick sinus syndrome: Secondary | ICD-10-CM | POA: Diagnosis not present

## 2014-08-31 NOTE — Patient Instructions (Signed)
Medication Instructions: - no change  Labwork: - none  Procedures/Testing: - none  Follow-Up: - Remote monitoring is used to monitor your Pacemaker of ICD from home. This monitoring reduces the number of office visits required to check your device to one time per year. It allows us to keep an eye on the functioning of your device to ensure it is working properly. You are scheduled for a device check from home on 11/30/14. You may send your transmission at any time that day. If you have a wireless device, the transmission will be sent automatically. After your physician reviews your transmission, you will receive a postcard with your next transmission date.  - Your physician wants you to follow-up in: 1 year with Dr. Graciela HusbandsKlein. You will receive a reminder letter in the mail two months in advance. If you don't receive a letter, please call our office to schedule the follow-up appointment.  Any Additional Special Instructions Will Be Listed Below (If Applicable).

## 2014-08-31 NOTE — Progress Notes (Signed)
      Patient Care Team: Malva Limesonald E Fisher, MD as PCP - General (Family Medicine)   HPI  Max Lozano is a 64 y.o. male Has hx of pacer implanted for symptomatic bradycardia and atrial fibrillaton CHADSVASC=1 (HTN)  He has lost 50 lbs  And has had unrelated to this an intercurrent bronchitis and pneumonia ffrom which he is gradually recovering   Past Medical History  Diagnosis Date  . Arrhythmia     Paroxysmal atrial fib   . Sick sinus syndrome     s/p pacer  . Syncope and collapse   . Hypertension   . Hyperlipidemia   . PPM-Medtronic 10/11/2009    Qualifier: Diagnosis of  By: Ladona Ridgelaylor, MD, Jerrell MylarFACC, Gregg William     Past Surgical History  Procedure Laterality Date  . Insert / replace / remove pacemaker  09/04/07    Medtronic    Current Outpatient Prescriptions  Medication Sig Dispense Refill  . aspirin 81 MG EC tablet Take 81 mg by mouth daily.      Marland Kitchen. atorvastatin (LIPITOR) 10 MG tablet Take 1 tablet (10 mg total) by mouth daily. 30 tablet 6  . diltiazem (CARDIZEM CD) 180 MG 24 hr capsule TAKE 1 CAPSULE BY MOUTH ONCE DAILY. 30 capsule 6  . fish oil-omega-3 fatty acids 1000 MG capsule Take 1 g by mouth 2 (two) times daily.      Marland Kitchen. lisinopril-hydrochlorothiazide (PRINZIDE,ZESTORETIC) 20-12.5 MG per tablet Take 1 tablet by mouth daily.       No current facility-administered medications for this visit.    Allergies  Allergen Reactions  . Atenolol     Review of Systems negative except from HPI and PMH  Physical Exam BP 128/68 mmHg  Pulse 67  Ht 5\' 10"  (1.778 m)  Wt 172 lb 12 oz (78.359 kg)  BMI 24.79 kg/m2 Well developed and nourished in no acute distress HENT normal Neck supple with JVP-flat Clear Device pocket well healed; without hematoma or erythema.  There is no tethering  Regular rate and rhythm, no murmurs or gallops Abd-soft with active BS No Clubbing cyanosis edema Skin-warm and dry A & Oriented  Grossly normal sensory and motor  function    Assessment and  Plan  Sinus node dysfunction  100% A paced with reasonable excursion and good exercise tolerance  Paroxysmal atrial fibrillation  Pacemaker-Medtronic The patient's device was interrogated.  The information was reviewed. No changes were made in the programming.     Hypertension  He remains on ASA   We will have him stop taking this based on recent recommendations for PAF  BP well controlled

## 2014-09-01 LAB — MDC_IDC_ENUM_SESS_TYPE_INCLINIC
Battery Impedance: 552 Ohm
Battery Voltage: 2.79 V
Brady Statistic AP VS Percent: 94 %
Brady Statistic AS VP Percent: 0 %
Brady Statistic AS VS Percent: 6 %
Date Time Interrogation Session: 20160426151620
Lead Channel Impedance Value: 543 Ohm
Lead Channel Pacing Threshold Amplitude: 0.5 V
Lead Channel Pacing Threshold Amplitude: 0.625 V
Lead Channel Pacing Threshold Pulse Width: 0.4 ms
Lead Channel Pacing Threshold Pulse Width: 0.4 ms
Lead Channel Sensing Intrinsic Amplitude: 11.2 mV
Lead Channel Setting Pacing Amplitude: 2.5 V
Lead Channel Setting Sensing Sensitivity: 5.6 mV
MDC IDC MSMT BATTERY REMAINING LONGEVITY: 82 mo
MDC IDC MSMT LEADCHNL RA IMPEDANCE VALUE: 446 Ohm
MDC IDC SET LEADCHNL RA PACING AMPLITUDE: 2 V
MDC IDC SET LEADCHNL RV PACING PULSEWIDTH: 0.4 ms
MDC IDC STAT BRADY AP VP PERCENT: 0 %

## 2014-09-05 NOTE — Discharge Summary (Signed)
PATIENT NAME:  Max Lozano, Max Lozano MR#:  [id number] DATE OF BIRTH:  November 20, 1950  DATE OF ADMISSION:  08/16/2014 DATE OF DISCHARGE:  08/18/2014  DISCHARGE DIAGNOSIS: Acute respiratory failure with hypoxia due to acute bronchitis.   SECONDARY DIAGNOSES:   1.  Hypertension.  2.  Pacemaker placement for bradycardia and syncope in 2010.   CONSULTATIONS: None.   PROCEDURES AND RADIOLOGY: Chest x-ray on 11th of April showed no acute cardiopulmonary disease. Mild DJD of the thoracic spine.   MAJOR LABORATORY PANEL: Blood cultures x2 were negative, on the 11th of April.  Influenza test was negative.   HISTORY AND SHORT HOSPITAL COURSE: The patient is a 64 year old male with above-mentioned medical problems who was admitted for shortness of breath and cough. This was thought to be acute hypoxic respiratory failure secondary to acute bronchitis. Please see Dr. Jerrye Nobleatherine Walsh's dictated history and physical for further details. The patient was noted to have hypoxia on ambulation so was set up to get 2 liters oxygen via nasal cannula. His oxygen saturation dropped to 86% on exertion. He was informed to use 2 liters oxygen mainly with ambulation and at night. He was feeling much better by the 13th of April and was discharged home in stable condition.   PERTINENT DISCHARGE PHYSICAL EXAMINATION: VITAL SIGNS: On the date of discharge, his temperature was 98, heart rate 96 per minute, respirations 20 per minute, blood pressure 139/87 and he was saturating 93% on room air.  CARDIOVASCULAR: S1, S2 normal. No murmurs, rubs, or gallops.  LUNGS: Clear to auscultation bilaterally. No wheezing, rales, rhonchi, crepitation.  ABDOMEN: Soft, benign.  NEUROLOGIC: Nonfocal examination. All other physical examination remained at baseline.   DISCHARGE MEDICATIONS:  Medication Instructions  atorvastatin 10 mg oral tablet  1 tab(s) orally once a day (in the morning)   diltiazem 180 mg/24 hours oral capsule, extended release   1 cap(s) orally once a day (in the morning)   fish oil 1000 mg oral capsule  1 cap(s) orally once a day (at bedtime)   prednisone 10 mg oral tablet  Start at 60 mg and taper by 10 mg daily until complete   azithromycin 500 mg oral tablet  1 tab(s) orally once a day x 3 days     DISCHARGE DIET: Low sodium, low fat, low cholesterol.   DISCHARGE ACTIVITY: As tolerated.   DISCHARGE INSTRUCTIONS AND FOLLOW-UP: The patient was instructed to follow up with his primary care physician, Dr. Mila Merryonald Fisher, in 1 to 2 weeks. He will need follow-up with Dr. Julien Nordmannimothy Gollan in 2 to 4 weeks.   He was set up to get 2 liters oxygen via nasal cannula mainly on ambulation and at night when he is asleep.   He remains at high risk for readmission. His blood pressure medication, HCTZ/lisinopril, was held and his aspirin was also held for time being considering his ongoing cough and that he could have hemoptysis due to bronchial irritation.   TOTAL TIME DISCHARGING THIS PATIENT: 45 minutes.   ____________________________ Ellamae SiaVipul S. Sherryll BurgerShah, MD vss:sb D: 08/19/2014 12:07:31 ET T: 08/19/2014 12:40:17 ET JOB#: [id number]  cc: Neville Pauls S. Sherryll BurgerShah, MD, <Dictator> Demetrios Isaacsonald E. Sherrie MustacheFisher, MD Antonieta Ibaimothy J. Gollan, MD Ellamae SiaVIPUL S [facility] MD ELECTRONICALLY SIGNED 08/19/2014 14:34

## 2014-09-05 NOTE — H&P (Signed)
PATIENT NAME:  Max Lozano, Macaulay L MR#:  [id number] DATE OF BIRTH:  [birth year]  DATE OF ADMISSION:  08/16/2014  REFERRING EMERGENCY ROOM PHYSICIAN:  Su Leyobert L Kinner, MD   PRIMARY CARE PHYSICIAN: Malva Limesonald E Fisher, MD   PRIMARY CARDIOLOGIST:  Antonieta Ibaimothy J Gollan, MD   CHIEF COMPLAINT: Shortness of breath and cough.   HISTORY OF PRESENT ILLNESS: This very pleasant 64 year old man with past medical history of hypertension and bradycardia status post pacemaker placement [facility] with complaint of cough for greater than 1 week. He reports that he has been coughing nonstop. No sputum production. No hemoptysis for the past 10 days. He has had some subjective fevers and chills, has not taken his temperature at home. He has had occasions of diaphoresis. He has had 1 episode of emesis 3 days ago, which was not followed by any further episodes, nausea or diarrhea. He has been very tired and has spent much of the past few days in bed. He is short of breath with any exertion. He denies chest pain or edema. He does report that prior to onset of symptoms, he went golfing with a friend who had a very similar symptoms. On presentation to the Emergency Room, his oxygen saturation is 85% on room air.   PAST MEDICAL HISTORY: 1.  Hypertension.  2.  Pacemaker placement for bradycardia and syncope in 2010.   PAST SURGICAL HISTORY: Pacemaker placement in 2010.   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   FAMILY MEDICAL HISTORY: Positive for coronary artery disease with fatal MI in his father at age 62; his mother and brother have congestive heart failure, no family history of CVA. No family history of cancers.   REVIEW OF SYSTEMS: CONSTITUTIONAL: Positive for chills, fevers, fatigue, weakness, no change in weight.  HEENT: No change in vision or hearing. No pain in the eyes or ears. He  does have a sore throat. No difficulty swallowing. No sinus congestion.  RESPIRATORY: Positive for cough, no hemoptysis, or sputum production; positive for dyspnea with exertion. No painful respirations.  CARDIOVASCULAR: No chest pain, orthopnea, or edema, no palpitations, or syncope.  GASTROINTESTINAL: One episode of emesis with no nausea or diarrhea. No abdominal pain. No hematemesis.  GENITOURINARY: No dysuria or frequency.  ENDOCRINE: No, hot or cold intolerance, no polyuria or polydipsia.  HEMATOLOGIC: No easy bruising or bleeding. No swollen lymph glands.  SKIN: No new rashes, lesions, or wounds.  MUSCULOSKELETAL: No new pain in the neck, back, shoulders, knees, or hips. No history of gout.  NEUROLOGIC: No focal numbness or weakness. No confusion, history of CVA, migraines or headache.  PSYCHIATRIC: No bipolar disorder or schizophrenia.   HOME MEDICATIONS: 1.  Lisinopril 20 mg 1 tablet daily.  2.  Fish oil tablet 1 tablet daily.  3.  Diltiazem 180 mg 1 tablet once a day.  4.  Atorvastatin 10 mg 1 tablet once a day at bedtime.  5.  Aspirin 81 mg daily.   ALLERGIES: No known allergies.   PHYSICAL EXAMINATION: VITAL SIGNS: Temperature 98.2, pulse 67, respirations 20, blood pressure 165/72, oxygenation 94% on 2 liters nasal cannula, 85% on room air.  GENERAL: No acute distress.  HEENT: Pupils equal, round, and reactive to light; conjunctivae clear, extraocular motion intact, oral mucous membranes pink and moist. Posterior oropharynx is slightly erythematous with no exudate, or edema. There is white coating over  the tongue. No cervical lymphadenopathy. Trachea is midline.  RESPIRATORY: There are bibasilar crackles; coarse breath sounds, good air movement, dry cough, no respiratory distress.  CARDIOVASCULAR: Regular rate and rhythm. No murmurs, rubs, or gallops. No peripheral edema. Peripheral pulses are 2+, no JVD or carotid bruit.  ABDOMEN: Soft, nontender, nondistended. Bowel sounds  normal. No guarding, no rebound, no hepatosplenomegaly or mass noted.  SKIN: No rashes, lesions, or open wounds.  MUSCULOSKELETAL: No joint effusions. Range of motion is normal. Strength is 5 out of 5 throughout.  NEUROLOGIC: Cranial nerves II through XII grossly intact, strength and sensation intact.  PSYCHIATRIC: He is alert and oriented x4, with good insight into his clinical condition.   LABORATORY DATA: Sodium 138, potassium 4.1, chloride 102, bicarbonate 27, BUN 22, creatinine 0.95, glucose 136, calcium 87, troponin less than 0.03; white blood cells 4.5, troponin 14.1, platelets 91, MCV is 92.   IMAGING: Chest x-ray shows dual lead cardiac pacemaker in place. Mild infrahilar increased bronchial and interstitial markings without convincing pulmonary edema, mild degenerative changes in the thoracic spine, no segmental infiltrate.   ASSESSMENT AND PLAN: Problem:  1.  Acute respiratory failure with hypoxia due to acute bronchitis: We will start scheduled nebulizer treatments and oral steroids. We will start azithromycin. I suspect this is a viral bronchitis. We will need an ambulatory oxygen assessment in the morning.  2.  Dizziness: Check orthostatic vital signs. I suspect that he is dehydrated due to his increased BUN and decreased p.o. intake over the past few days. We will provide normal saline.  3.  Thrombocytopenia: I do not have prior laboratories for this patient. We will hold off on pharmacological deep vein thrombosis prophylaxis and provide sequential compression devices. Continue to monitor. No history of easy bruising or bleeding.  4.  Hypertension: He is slightly hypertensive at this time. I will continue his home medications of lisinopril and diltiazem.  5.  History of bradycardia with pacemaker placement: Currently in normal sinus rhythm with a rate of 75 an EKG.  6.  Prophylaxis: No pharmacological deep vein thrombosis prophylaxis due to thrombocytopenia. No gastrointestinal  prophylaxis needed at this time.  7.  The patient is a full code.   TIME SPENT ON ADMISSION: 40 minutes.    ____________________________ Ena Dawley. Clent Ridges, MD cpw:nt D: 08/16/2014 15:21:00 ET T: 08/16/2014 15:35:03 ET JOB#: [id number]  cc: Santina Evans P. Clent Ridges, MD, <Dictator> Gale Journey MD ELECTRONICALLY SIGNED 08/24/2014 15:08

## 2014-09-16 ENCOUNTER — Ambulatory Visit
Admission: RE | Admit: 2014-09-16 | Discharge: 2014-09-16 | Disposition: A | Discharge status: Home / Self Care | Payer: BLUE CROSS/BLUE SHIELD | Source: Ambulatory Visit | Attending: Family Medicine | Admitting: Family Medicine

## 2014-09-16 ENCOUNTER — Other Ambulatory Visit: Payer: Self-pay | Admitting: Family Medicine

## 2014-09-16 DIAGNOSIS — J189 Pneumonia, unspecified organism: Secondary | ICD-10-CM | POA: Diagnosis not present

## 2014-09-16 LAB — HM HEPATITIS C SCREENING LAB: HM Hepatitis Screen: NEGATIVE

## 2014-10-01 ENCOUNTER — Encounter: Payer: Self-pay | Admitting: Cardiovascular Disease

## 2014-10-01 ENCOUNTER — Ambulatory Visit (INDEPENDENT_AMBULATORY_CARE_PROVIDER_SITE_OTHER): Payer: BLUE CROSS/BLUE SHIELD | Admitting: Cardiovascular Disease

## 2014-10-01 VITALS — BP 124/58 | HR 68 | Ht 70.0 in | Wt 184.8 lb

## 2014-10-01 DIAGNOSIS — R0989 Other specified symptoms and signs involving the circulatory and respiratory systems: Secondary | ICD-10-CM | POA: Diagnosis not present

## 2014-10-01 DIAGNOSIS — I10 Essential (primary) hypertension: Secondary | ICD-10-CM | POA: Diagnosis not present

## 2014-10-01 DIAGNOSIS — I4891 Unspecified atrial fibrillation: Secondary | ICD-10-CM

## 2014-10-01 DIAGNOSIS — E785 Hyperlipidemia, unspecified: Secondary | ICD-10-CM | POA: Diagnosis not present

## 2014-10-01 MED ORDER — DILTIAZEM HCL ER COATED BEADS 180 MG PO CP24: 180.0000 mg | ORAL_CAPSULE | Freq: Every day | ORAL | Status: DC

## 2014-10-01 MED ORDER — LISINOPRIL-HYDROCHLOROTHIAZIDE 20-12.5 MG PO TABS: 1.0000 | ORAL_TABLET | Freq: Every day | ORAL | Status: DC

## 2014-10-01 MED ORDER — ATORVASTATIN CALCIUM 10 MG PO TABS: 10.0000 mg | ORAL_TABLET | Freq: Every day | ORAL | Status: DC

## 2014-10-01 NOTE — Assessment & Plan Note (Signed)
Cholesterol is at goal on the current lipid regimen. No changes to the medications were made. Numbers better after dramatic weight loss

## 2014-10-01 NOTE — Assessment & Plan Note (Signed)
We have requested the carotid ultrasound from 2 years ago done through primary care given his bruit on the right

## 2014-10-01 NOTE — Progress Notes (Signed)
Patient ID: Max Lozano, male    DOB: 04/04/1951, 64 y.o.   MRN: [id number]  HPI Comments: [name] is a 64 year old male with a history of hypertension, atrial fibrillation c/b tachy-brady syndrome s/p pacemaker.  ETT 08/2007 with excellent exercise capacity no evidence of ischemia who presents for routine followup of his atrial fibrillation.  He was recently in the hospital April 11 with discharge 08/18/2014 with upper respiratory infection Acute onset of shortness of breath, treated without biotics In follow-up today he reports that he is doing well. He has lost 40 pounds over the past several months, after changing his diet and working out. Most recent total cholesterol in the 150 range per patient  Other past medical history significant family history of coronary artery disease with father who had MI in his late 6250s, he was a smoker, mother with congestive heart failure in her 7280s.  EKG on today's visitshows normal sinus rhythm with rate 68 beats per minute with no significant ST or T wave changes   Allergies  Allergen Reactions  . Atenolol     Current Outpatient Prescriptions on File Prior to Visit  Medication Sig Dispense Refill  . aspirin 81 MG EC tablet Take 81 mg by mouth daily.      . fish oil-omega-3 fatty acids 1000 MG capsule Take 1 g by mouth 2 (two) times daily.       No current facility-administered medications on file prior to visit.    Past Medical History  Diagnosis Date  . Arrhythmia     Paroxysmal atrial fib   . Sick sinus syndrome     s/p pacer  . Syncope and collapse   . Hypertension   . Hyperlipidemia   . PPM-Medtronic 10/11/2009    Qualifier: Diagnosis of  By: Ladona Ridgelaylor, MD, Jerrell MylarFACC, Gregg William     Past Surgical History  Procedure Laterality Date  . Insert / replace / remove pacemaker  09/04/07    Medtronic    Social History  reports that he has never smoked. He does not have any smokeless tobacco history on file. He reports that he does not  drink alcohol or use illicit drugs.  Family History family history includes Diabetes in his mother; Heart attack in his father.  Review of Systems  Constitutional: Negative.   Respiratory: Negative.   Cardiovascular: Negative.   Gastrointestinal: Negative.   Musculoskeletal: Negative.   Skin: Negative.   Neurological: Negative.   Psychiatric/Behavioral: Negative.   All other systems reviewed and are negative.   BP 124/58 mmHg  Pulse 68  Ht 5\' 10"  (1.778 m)  Wt 184 lb 12 oz (83.802 kg)  BMI 26.51 kg/m2  Physical Exam  Constitutional: He is oriented to person, place, and time. He appears well-developed and well-nourished.  HENT:  Head: Normocephalic.  Nose: Nose normal.  Mouth/Throat: Oropharynx is clear and moist.  Eyes: Conjunctivae are normal. Pupils are equal, round, and reactive to light.  Neck: Normal range of motion. Neck supple. No JVD present. Carotid bruit is present.  Cardiovascular: Normal rate, regular rhythm, S1 normal, S2 normal, normal heart sounds and intact distal pulses.  Exam reveals no gallop and no friction rub.   No murmur heard. Pulmonary/Chest: Effort normal and breath sounds normal. No respiratory distress. He has no wheezes. He has no rales. He exhibits no tenderness.  Abdominal: Soft. Bowel sounds are normal. He exhibits no distension. There is no tenderness.  Musculoskeletal: Normal range of motion. He exhibits no edema or  tenderness.  Lymphadenopathy:    He has no cervical adenopathy.  Neurological: He is alert and oriented to person, place, and time. Coordination normal.  Skin: Skin is warm and dry. No rash noted. No erythema.  Psychiatric: He has a normal mood and affect. His behavior is normal. Judgment and thought content normal.      Assessment and Plan   Nursing note and vitals reviewed.

## 2014-10-01 NOTE — Assessment & Plan Note (Signed)
Blood pressure is well controlled on today's visit. No changes made to the medications. 

## 2014-10-01 NOTE — Assessment & Plan Note (Signed)
He denies any recent arrhythmia. None documented on recent stay in the hospital April 2016 Pacer monitored by EP

## 2014-10-01 NOTE — Patient Instructions (Signed)
You are doing well. No medication changes were made.  Please call us if you have new issues that need to be addressed before your next appt.  Your physician wants you to follow-up in: 12 months.  You will receive a reminder letter in the mail two months in advance. If you don't receive a letter, please call our office to schedule the follow-up appointment. 

## 2014-10-12 ENCOUNTER — Telehealth: Payer: Self-pay | Admitting: Family Medicine

## 2014-10-12 DIAGNOSIS — J189 Pneumonia, unspecified organism: Secondary | ICD-10-CM

## 2014-10-12 NOTE — Telephone Encounter (Signed)
Please advise patient it is time to recheck chest XR for follow up of Pneumonia. Have entered order into emr

## 2014-10-12 NOTE — Telephone Encounter (Signed)
-----   Message from Malva Limesonald E Jhayla Podgorski, MD sent at 09/21/2014  7:47 AM EDT ----- Regarding: repeat chest XT first week of June For pneumonia

## 2014-10-12 NOTE — Telephone Encounter (Signed)
LMOVM for pt to return call 

## 2014-10-13 NOTE — Telephone Encounter (Signed)
[facility] and asked that he be called back after 4pm today because it is difficult for him to answer the phone while at work. Thanks TNP

## 2014-10-14 NOTE — Telephone Encounter (Signed)
Spoke with patient and advised him that it is time for a repeat chest x ray to follow up on pneumonia. Patient agrees to have repeat x ray done and plans to have this done Tomorrow evening ( 10/15/2014).

## 2014-10-15 ENCOUNTER — Ambulatory Visit
Admission: RE | Admit: 2014-10-15 | Discharge: 2014-10-15 | Disposition: A | Discharge status: Home / Self Care | Payer: BLUE CROSS/BLUE SHIELD | Source: Ambulatory Visit | Attending: Family Medicine | Admitting: Family Medicine

## 2014-10-15 DIAGNOSIS — J189 Pneumonia, unspecified organism: Secondary | ICD-10-CM

## 2014-10-19 ENCOUNTER — Other Ambulatory Visit: Payer: Self-pay

## 2014-10-19 DIAGNOSIS — Z1211 Encounter for screening for malignant neoplasm of colon: Secondary | ICD-10-CM

## 2014-10-19 MED ORDER — NA SULFATE-K SULFATE-MG SULF 17.5-3.13-1.6 GM/177ML PO SOLN: 1.0000 | ORAL | Status: DC

## 2014-10-19 NOTE — Progress Notes (Signed)
Gastroenterology Pre-Procedure Review  Request Date: 02-11-15 Requesting Physician: Dr. Sherrie Mustache  PATIENT REVIEW QUESTIONS: The patient responded to the following health history questions as indicated:    1. Are you having any GI issues? no 2. Do you have a personal history of Polyps? yes (benign polyp) 3. Do you have a family history of Colon Cancer or Polyps? no 4. Diabetes Mellitus? no 5. Joint replacements in the past 12 months?no 6. Major health problems in the past 3 months?yes (Pneumonia) 7. Any artificial heart valves, MVP, or defibrillator?yes (pacemaker)    MEDICATIONS & ALLERGIES:    Patient reports the following regarding taking any anticoagulation/antiplatelet therapy:   Plavix, Coumadin, Eliquis, Xarelto, Lovenox, Pradaxa, Brilinta, or Effient? no Aspirin? no  Patient confirms/reports the following medications:  Current Outpatient Prescriptions  Medication Sig Dispense Refill  . aspirin 81 MG EC tablet Take 81 mg by mouth daily.      Marland Kitchen atorvastatin (LIPITOR) 10 MG tablet Take 1 tablet (10 mg total) by mouth daily. 90 tablet 3  . diltiazem (CARDIZEM CD) 180 MG 24 hr capsule Take 1 capsule (180 mg total) by mouth daily. 90 capsule 3  . fish oil-omega-3 fatty acids 1000 MG capsule Take 1 g by mouth 2 (two) times daily.      Marland Kitchen lisinopril-hydrochlorothiazide (PRINZIDE,ZESTORETIC) 20-12.5 MG per tablet Take 1 tablet by mouth daily. 90 tablet 3   No current facility-administered medications for this visit.    Patient confirms/reports the following allergies:  Allergies  Allergen Reactions  . Atenolol     No orders of the defined types were placed in this encounter.    AUTHORIZATION INFORMATION Primary Insurance: 1D#: Group #:  Secondary Insurance: 1D#: Group #:  SCHEDULE INFORMATION: Date: 02-11-15 Time: Location: [place]

## 2014-10-21 ENCOUNTER — Telehealth: Payer: Self-pay

## 2014-10-21 NOTE — Telephone Encounter (Signed)
-----   Message from Myrtis Hopping sent at 10/11/2014  9:51 AM EDT ----- Regarding: colonoscopy From: Samuella Cota Sent: 09/22/2014  triage

## 2014-10-21 NOTE — Telephone Encounter (Signed)
Pt returned call to schedule colonoscopy. Pt needed to wait until October for procedure. Pt scheduled for Oct 7th at [facility]. Instructs/rx mailed.

## 2014-10-26 ENCOUNTER — Encounter: Payer: Self-pay | Admitting: Internal Medicine

## 2014-11-24 ENCOUNTER — Encounter: Payer: Self-pay | Admitting: Internal Medicine

## 2014-11-30 ENCOUNTER — Encounter: Payer: BLUE CROSS/BLUE SHIELD | Admitting: *Deleted

## 2014-12-15 ENCOUNTER — Encounter: Payer: Self-pay | Admitting: *Deleted

## 2014-12-28 ENCOUNTER — Ambulatory Visit (INDEPENDENT_AMBULATORY_CARE_PROVIDER_SITE_OTHER): Payer: BLUE CROSS/BLUE SHIELD | Admitting: *Deleted

## 2014-12-28 ENCOUNTER — Encounter: Payer: Self-pay | Admitting: Internal Medicine

## 2014-12-28 DIAGNOSIS — I495 Sick sinus syndrome: Secondary | ICD-10-CM | POA: Diagnosis not present

## 2014-12-30 NOTE — Progress Notes (Signed)
Remote pacemaker transmission.   

## 2015-01-05 LAB — CUP PACEART REMOTE DEVICE CHECK
Battery Remaining Longevity: 76 mo
Brady Statistic AS VP Percent: 0 %
Brady Statistic AS VS Percent: 6 %
Date Time Interrogation Session: 20160823200534
Lead Channel Impedance Value: 406 Ohm
Lead Channel Setting Pacing Amplitude: 2.5 V
Lead Channel Setting Pacing Pulse Width: 0.4 ms
MDC IDC MSMT BATTERY IMPEDANCE: 627 Ohm
MDC IDC MSMT BATTERY VOLTAGE: 2.78 V
MDC IDC MSMT LEADCHNL RV IMPEDANCE VALUE: 507 Ohm
MDC IDC SET LEADCHNL RA PACING AMPLITUDE: 2 V
MDC IDC SET LEADCHNL RV SENSING SENSITIVITY: 5.6 mV
MDC IDC STAT BRADY AP VP PERCENT: 0 %
MDC IDC STAT BRADY AP VS PERCENT: 93 %

## 2015-01-17 ENCOUNTER — Encounter: Payer: Self-pay | Admitting: Cardiology

## 2015-03-17 ENCOUNTER — Encounter: Payer: Self-pay | Admitting: *Deleted

## 2015-03-24 NOTE — Discharge Instructions (Signed)

## 2015-03-25 ENCOUNTER — Ambulatory Visit
Admission: RE | Admit: 2015-03-25 | Discharge: 2015-03-25 | Disposition: A | Discharge status: Home / Self Care | Payer: BLUE CROSS/BLUE SHIELD | Source: Ambulatory Visit | Attending: Gastroenterology | Admitting: Gastroenterology

## 2015-03-25 ENCOUNTER — Ambulatory Visit: Payer: BLUE CROSS/BLUE SHIELD | Admitting: Student in an Organized Health Care Education/Training Program

## 2015-03-25 ENCOUNTER — Other Ambulatory Visit: Payer: Self-pay | Admitting: Gastroenterology

## 2015-03-25 ENCOUNTER — Encounter
Admission: RE | Disposition: A | Discharge status: Home / Self Care | Payer: Self-pay | Source: Ambulatory Visit | Attending: Gastroenterology

## 2015-03-25 DIAGNOSIS — I495 Sick sinus syndrome: Secondary | ICD-10-CM | POA: Diagnosis not present

## 2015-03-25 DIAGNOSIS — E785 Hyperlipidemia, unspecified: Secondary | ICD-10-CM | POA: Insufficient documentation

## 2015-03-25 DIAGNOSIS — Z8249 Family history of ischemic heart disease and other diseases of the circulatory system: Secondary | ICD-10-CM | POA: Insufficient documentation

## 2015-03-25 DIAGNOSIS — Z950 Presence of cardiac pacemaker: Secondary | ICD-10-CM | POA: Insufficient documentation

## 2015-03-25 DIAGNOSIS — Z7982 Long term (current) use of aspirin: Secondary | ICD-10-CM | POA: Insufficient documentation

## 2015-03-25 DIAGNOSIS — K5730 Diverticulosis of large intestine without perforation or abscess without bleeding: Secondary | ICD-10-CM | POA: Diagnosis not present

## 2015-03-25 DIAGNOSIS — I480 Paroxysmal atrial fibrillation: Secondary | ICD-10-CM | POA: Diagnosis not present

## 2015-03-25 DIAGNOSIS — Z79899 Other long term (current) drug therapy: Secondary | ICD-10-CM | POA: Diagnosis not present

## 2015-03-25 DIAGNOSIS — Z1211 Encounter for screening for malignant neoplasm of colon: Secondary | ICD-10-CM | POA: Diagnosis not present

## 2015-03-25 DIAGNOSIS — I10 Essential (primary) hypertension: Secondary | ICD-10-CM | POA: Insufficient documentation

## 2015-03-25 DIAGNOSIS — K633 Ulcer of intestine: Secondary | ICD-10-CM

## 2015-03-25 DIAGNOSIS — K641 Second degree hemorrhoids: Secondary | ICD-10-CM | POA: Insufficient documentation

## 2015-03-25 HISTORY — DX: Motion sickness, initial encounter: T75.3XXA

## 2015-03-25 HISTORY — DX: Presence of dental prosthetic device (complete) (partial): Z97.2

## 2015-03-25 HISTORY — PX: COLONOSCOPY WITH PROPOFOL: SHX5780

## 2015-03-25 SURGERY — COLONOSCOPY WITH PROPOFOL
Anesthesia: Monitor Anesthesia Care | Wound class: Contaminated

## 2015-03-25 MED ORDER — FENTANYL CITRATE (PF) 100 MCG/2ML IJ SOLN: 25.0000 ug | INTRAMUSCULAR | Status: DC | PRN

## 2015-03-25 MED ORDER — PROPOFOL 10 MG/ML IV BOLUS
INTRAVENOUS | Status: DC | PRN
  Administered 2015-03-25: 20 mg via INTRAVENOUS
  Administered 2015-03-25: 30 mg via INTRAVENOUS
  Administered 2015-03-25: 100 mg via INTRAVENOUS
  Administered 2015-03-25: 50 mg via INTRAVENOUS
  Administered 2015-03-25: 20 mg via INTRAVENOUS

## 2015-03-25 MED ORDER — OXYCODONE HCL 5 MG/5ML PO SOLN: 5.0000 mg | Freq: Once | ORAL | Status: DC | PRN

## 2015-03-25 MED ORDER — STERILE WATER FOR IRRIGATION IR SOLN
Status: DC | PRN
  Administered 2015-03-25: 08:00:00

## 2015-03-25 MED ORDER — OXYCODONE HCL 5 MG PO TABS: 5.0000 mg | ORAL_TABLET | Freq: Once | ORAL | Status: DC | PRN

## 2015-03-25 MED ORDER — LACTATED RINGERS IV SOLN
INTRAVENOUS | Status: DC
Start: 2015-03-25 — End: 2015-03-25
  Administered 2015-03-25: 08:00:00 via INTRAVENOUS

## 2015-03-25 MED ORDER — LIDOCAINE HCL (CARDIAC) 20 MG/ML IV SOLN
INTRAVENOUS | Status: DC | PRN
  Administered 2015-03-25: 40 mg via INTRAVENOUS

## 2015-03-25 SURGICAL SUPPLY — 28 items
CANISTER SUCT 1200ML W/VALVE (MISCELLANEOUS) ×3 IMPLANT
FCP ESCP3.2XJMB 240X2.8X (MISCELLANEOUS)
FORCEPS BIOP RAD 4 LRG CAP 4 (CUTTING FORCEPS) ×3 IMPLANT
FORCEPS BIOP RJ4 240 W/NDL (MISCELLANEOUS)
FORCEPS ESCP3.2XJMB 240X2.8X (MISCELLANEOUS) IMPLANT
GOWN CVR UNV OPN BCK APRN NK (MISCELLANEOUS) ×2 IMPLANT
GOWN ISOL THUMB LOOP REG UNIV (MISCELLANEOUS) ×4
HEMOCLIP INSTINCT (CLIP) IMPLANT
INJECTOR VARIJECT VIN23 (MISCELLANEOUS) IMPLANT
KIT CO2 TUBING (TUBING) IMPLANT
KIT DEFENDO VALVE AND CONN (KITS) IMPLANT
KIT ENDO PROCEDURE OLY (KITS) ×3 IMPLANT
LIGATOR MULTIBAND 6SHOOTER MBL (MISCELLANEOUS) IMPLANT
MARKER SPOT ENDO TATTOO 5ML (MISCELLANEOUS) IMPLANT
PAD GROUND ADULT SPLIT (MISCELLANEOUS) IMPLANT
SNARE SHORT THROW 13M SML OVAL (MISCELLANEOUS) IMPLANT
SNARE SHORT THROW 30M LRG OVAL (MISCELLANEOUS) IMPLANT
SPOT EX ENDOSCOPIC TATTOO (MISCELLANEOUS)
SUCTION POLY TRAP 4CHAMBER (MISCELLANEOUS) IMPLANT
TRAP SUCTION POLY (MISCELLANEOUS) IMPLANT
TUBING CONN 6MMX3.1M (TUBING)
TUBING SUCTION CONN 0.25 STRL (TUBING) IMPLANT
UNDERPAD 30X60 958B10 (PK) (MISCELLANEOUS) IMPLANT
VALVE BIOPSY ENDO (VALVE) IMPLANT
VARIJECT INJECTOR VIN23 (MISCELLANEOUS)
WATER AUXILLARY (MISCELLANEOUS) IMPLANT
WATER STERILE IRR 250ML POUR (IV SOLUTION) ×3 IMPLANT
WATER STERILE IRR 500ML POUR (IV SOLUTION) IMPLANT

## 2015-03-25 NOTE — Anesthesia Postprocedure Evaluation (Signed)
  Anesthesia Post-op Note  Patient: Max Lozano  Procedure(s) Performed: Procedure(s) with comments: COLONOSCOPY WITH PROPOFOL (N/A) - ULCER ASCENDING COLON BX.   Anesthesia type:MAC  Patient location: PACU  Post pain: Pain level controlled  Post assessment: Post-op Vital signs reviewed, Patient's Cardiovascular Status Stable, Respiratory Function Stable, Patent Airway and No signs of Nausea or vomiting  Post vital signs: Reviewed and stable  Last Vitals:  Filed Vitals:   03/25/15 0822  BP:   Pulse: 58  Temp:   Resp: 11    Level of consciousness: awake, alert  and patient cooperative  Complications: No apparent anesthesia complications

## 2015-03-25 NOTE — Op Note (Signed)
[facility] Gastroenterology Patient Name: Max Lozano Procedure Date: 03/25/2015 7:54 AM MRN: [id number] Account #: [id number] Date of Birth: March 20, 1951 Admit Type: Outpatient Age: 64 Room: [facility] OR ROOM 01 Gender: Male Note Status: Finalized Procedure:         Colonoscopy Indications:       Screening for colorectal malignant neoplasm Providers:         Midge Minium, MD Referring MD:      Demetrios Isaacs. Sherrie Mustache, MD (Referring MD) Medicines:         Propofol per Anesthesia Complications:     No immediate complications. Procedure:         Pre-Anesthesia Assessment:                    - Prior to the procedure, a History and Physical was                     performed, and patient medications and allergies were                     reviewed. The patient's tolerance of previous anesthesia                     was also reviewed. The risks and benefits of the procedure                     and the sedation options and risks were discussed with the                     patient. All questions were answered, and informed consent                     was obtained. Prior Anticoagulants: The patient has taken                     no previous anticoagulant or antiplatelet agents. ASA                     Grade Assessment: II - A patient with mild systemic                     disease. After reviewing the risks and benefits, the                     patient was deemed in satisfactory condition to undergo                     the procedure.                    After obtaining informed consent, the colonoscope was                     passed under direct vision. Throughout the procedure, the                     patient's blood pressure, pulse, and oxygen saturations                     were monitored continuously. The Olympus CF H180AL                     colonoscope (S#: [id number]) was introduced through the anus  and advanced to the the cecum, identified by appendiceal           orifice and ileocecal valve. The colonoscopy was performed                     without difficulty. The patient tolerated the procedure                     well. The quality of the bowel preparation was fair. Findings:      The perianal and digital rectal examinations were normal.      A continuous area of nonbleeding ulcerated mucosa with no stigmata of       recent bleeding was present in the ascending colon. Biopsies were taken       with a cold forceps for histology.      A few small-mouthed diverticula were found in the sigmoid colon.      Non-bleeding internal hemorrhoids were found during retroflexion. The       hemorrhoids were Grade II (internal hemorrhoids that prolapse but reduce       spontaneously). Estimated blood loss: none. Impression:        - Mucosal ulceration. Biopsied.                    - Diverticulosis in the sigmoid colon.                    - Non-bleeding internal hemorrhoids. Recommendation:    - Await pathology results. Procedure Code(s): --- Professional ---                    [phone], Colonoscopy, flexible; with biopsy, single or                     multiple Diagnosis Code(s): --- Professional ---                    Z12.11, Encounter for screening for malignant neoplasm of                     colon                    K63.3, Ulcer of intestine                    K64.1, Second degree hemorrhoids CPT copyright 2014 American Medical Association. All rights reserved. The codes documented in this report are preliminary and upon coder review may  be revised to meet current compliance requirements. Midge Miniumarren Sargon Scouten, MD 03/25/2015 8:10:22 AM This report has been signed electronically. Number of Addenda: 0 Note Initiated On: 03/25/2015 7:54 AM Scope Withdrawal Time: 0 hours 6 minutes 59 seconds  Total Procedure Duration: 0 hours 9 minutes 6 seconds       [facility]

## 2015-03-25 NOTE — Anesthesia Procedure Notes (Signed)
Procedure Name: MAC Performed by: Delanie Tirrell Pre-anesthesia Checklist: Patient identified, Emergency Drugs available, Suction available, Timeout performed and Patient being monitored Patient Re-evaluated:Patient Re-evaluated prior to inductionOxygen Delivery Method: Nasal cannula Placement Confirmation: positive ETCO2     

## 2015-03-25 NOTE — Anesthesia Preprocedure Evaluation (Signed)
Anesthesia Evaluation  Patient identified by MRN, date of birth, ID band Patient awake    Reviewed: Allergy & Precautions, NPO status , Patient's Chart, lab work & pertinent test results  Airway Mallampati: II  TM Distance: >3 FB Neck ROM: Full    Dental no notable dental hx. (+) Lower Dentures, Upper Dentures   Pulmonary neg pulmonary ROS,    Pulmonary exam normal breath sounds clear to auscultation       Cardiovascular hypertension, Normal cardiovascular exam+ dysrhythmias + pacemaker  Rhythm:Regular Rate:Normal  Sick sinus syndrome   Neuro/Psych negative neurological ROS  negative psych ROS   GI/Hepatic negative GI ROS, Neg liver ROS,   Endo/Other  negative endocrine ROS  Renal/GU negative Renal ROS  negative genitourinary   Musculoskeletal negative musculoskeletal ROS (+)   Abdominal   Peds negative pediatric ROS (+)  Hematology negative hematology ROS (+)   Anesthesia Other Findings   Reproductive/Obstetrics negative OB ROS                             Anesthesia Physical Anesthesia Plan  ASA: III  Anesthesia Plan: MAC   Post-op Pain Management:    Induction: Intravenous  Airway Management Planned:   Additional Equipment:   Intra-op Plan:   Post-operative Plan: Extubation in OR  Informed Consent: I have reviewed the patients History and Physical, chart, labs and discussed the procedure including the risks, benefits and alternatives for the proposed anesthesia with the patient or authorized representative who has indicated his/her understanding and acceptance.   Dental advisory given  Plan Discussed with: CRNA  Anesthesia Plan Comments:         Anesthesia Quick Evaluation

## 2015-03-25 NOTE — Transfer of Care (Signed)
Immediate Anesthesia Transfer of Care Note  Patient: Max Lozano L [place]  Procedure(s) Performed: Procedure(s) with comments: COLONOSCOPY WITH PROPOFOL (N/A) - ULCER ASCENDING COLON BX.   Patient Location: PACU  Anesthesia Type: MAC  Level of Consciousness: awake, alert  and patient cooperative  Airway and Oxygen Therapy: Patient Spontanous Breathing and Patient connected to supplemental oxygen  Post-op Assessment: Post-op Vital signs reviewed, Patient's Cardiovascular Status Stable, Respiratory Function Stable, Patent Airway and No signs of Nausea or vomiting  Post-op Vital Signs: Reviewed and stable  Complications: No apparent anesthesia complications

## 2015-03-25 NOTE — H&P (Signed)
[facility]  [address], [place], [place] [address] Phone: [phone] Fax : [phone]  Primary Care Physician:  Lelon Huh, MD Primary Gastroenterologist:  Dr. Allen Norris  Pre-Procedure History & Physical: HPI:  Max Lozano is a 64 y.o. male is here for a screening colonoscopy.   Past Medical History  Diagnosis Date  . Arrhythmia     Paroxysmal atrial fib   . Sick sinus syndrome (HCC)     s/p pacer  . Syncope and collapse   . Hypertension   . Hyperlipidemia   . PPM-Medtronic 10/11/2009    Qualifier: Diagnosis of  By: Lovena Le, MD, Martyn Malay   . Motion sickness     deep sea boats  . Wears dentures     full upper and lower    Past Surgical History  Procedure Laterality Date  . Insert / replace / remove pacemaker  09/04/07    Medtronic    Prior to Admission medications   Medication Sig Start Date End Date Taking? Authorizing Provider  aspirin 81 MG EC tablet Take 81 mg by mouth daily.     Yes Historical Provider, MD  atorvastatin (LIPITOR) 10 MG tablet Take 1 tablet (10 mg total) by mouth daily. 10/01/14  Yes Minna Merritts, MD  diltiazem (CARDIZEM CD) 180 MG 24 hr capsule Take 1 capsule (180 mg total) by mouth daily. 10/01/14  Yes Minna Merritts, MD  fish oil-omega-3 fatty acids 1000 MG capsule Take 1 g by mouth 2 (two) times daily.     Yes Historical Provider, MD  lisinopril-hydrochlorothiazide (PRINZIDE,ZESTORETIC) 20-12.5 MG per tablet Take 1 tablet by mouth daily. 10/01/14  Yes Minna Merritts, MD  Na Sulfate-K Sulfate-Mg Sulf (SUPREP BOWEL PREP) SOLN Take 1 kit by mouth as directed. 10/19/14  Yes Lucilla Lame, MD    Allergies as of 10/19/2014 - Review Complete 10/19/2014  Allergen Reaction Noted  . Atenolol Rash 03/07/2012    Family History  Problem Relation Age of Onset  . Diabetes Mother   . Heart attack Father     Social History   Social History  . Marital Status: Married    Spouse Name: N/A  . Number of Children: 2    . Years of Education: N/A   Occupational History  .     Social History Main Topics  . Smoking status: Never Smoker   . Smokeless tobacco: Not on file  . Alcohol Use: No  . Drug Use: No  . Sexual Activity: Not on file   Other Topics Concern  . Not on file   Social History Narrative   Pt gets regular exercise.   2 children    Review of Systems: See HPI, otherwise negative ROS  Physical Exam: BP 130/78 mmHg  Pulse 61  Temp(Src) 97.3 F (36.3 C) (Temporal)  Resp 16  Ht _0  (1.778 m)  Wt 186 lb (84.369 kg)  BMI 26.69 kg/m2  SpO2 98% General:   Alert,  pleasant and cooperative in NAD Head:  Normocephalic and atraumatic. Neck:  Supple; no masses or thyromegaly. Lungs:  Clear throughout to auscultation.    Heart:  Regular rate and rhythm. Abdomen:  Soft, nontender and nondistended. Normal bowel sounds, without guarding, and without rebound.   Neurologic:  Alert and  oriented x4;  grossly normal neurologically.  Impression/Plan: Max Lozano is now here to undergo a screening colonoscopy.  Risks, benefits, and alternatives regarding colonoscopy have been reviewed with the patient.  Questions  have been answered.  All parties agreeable.

## 2015-03-28 ENCOUNTER — Encounter: Payer: Self-pay | Admitting: Gastroenterology

## 2015-03-29 ENCOUNTER — Ambulatory Visit (INDEPENDENT_AMBULATORY_CARE_PROVIDER_SITE_OTHER): Payer: BLUE CROSS/BLUE SHIELD | Admitting: *Deleted

## 2015-03-29 ENCOUNTER — Telehealth: Payer: Self-pay | Admitting: Cardiology

## 2015-03-29 DIAGNOSIS — I495 Sick sinus syndrome: Secondary | ICD-10-CM | POA: Diagnosis not present

## 2015-03-29 NOTE — Telephone Encounter (Signed)
LMOVM reminding pt to send remote transmission.   

## 2015-03-30 NOTE — Progress Notes (Signed)
Remote pacemaker transmission.   

## 2015-04-06 ENCOUNTER — Encounter: Payer: Self-pay | Admitting: Gastroenterology

## 2015-04-11 LAB — CUP PACEART REMOTE DEVICE CHECK
Brady Statistic AP VS Percent: 94 %
Brady Statistic AS VP Percent: 0 %
Brady Statistic AS VS Percent: 6 %
Date Time Interrogation Session: 20161122220846
Implantable Lead Location: 753859
Implantable Lead Model: 5076
Lead Channel Pacing Threshold Amplitude: 0.75 V
Lead Channel Pacing Threshold Pulse Width: 0.4 ms
Lead Channel Pacing Threshold Pulse Width: 0.4 ms
Lead Channel Sensing Intrinsic Amplitude: 8 mV
Lead Channel Setting Sensing Sensitivity: 5.6 mV
MDC IDC LEAD IMPLANT DT: 20090430
MDC IDC LEAD IMPLANT DT: 20090430
MDC IDC LEAD LOCATION: 753860
MDC IDC MSMT BATTERY IMPEDANCE: 704 Ohm
MDC IDC MSMT BATTERY REMAINING LONGEVITY: 73 mo
MDC IDC MSMT BATTERY VOLTAGE: 2.78 V
MDC IDC MSMT LEADCHNL RA IMPEDANCE VALUE: 433 Ohm
MDC IDC MSMT LEADCHNL RA PACING THRESHOLD AMPLITUDE: 0.5 V
MDC IDC MSMT LEADCHNL RV IMPEDANCE VALUE: 492 Ohm
MDC IDC SET LEADCHNL RA PACING AMPLITUDE: 2 V
MDC IDC SET LEADCHNL RV PACING AMPLITUDE: 2.5 V
MDC IDC SET LEADCHNL RV PACING PULSEWIDTH: 0.4 ms
MDC IDC STAT BRADY AP VP PERCENT: 0 %

## 2015-04-20 ENCOUNTER — Encounter: Payer: Self-pay | Admitting: Cardiology

## 2015-06-28 ENCOUNTER — Encounter: Payer: BLUE CROSS/BLUE SHIELD | Admitting: *Deleted

## 2015-06-29 ENCOUNTER — Encounter: Payer: Self-pay | Admitting: Cardiology

## 2015-07-06 ENCOUNTER — Ambulatory Visit (INDEPENDENT_AMBULATORY_CARE_PROVIDER_SITE_OTHER): Payer: BLUE CROSS/BLUE SHIELD | Admitting: *Deleted

## 2015-07-06 DIAGNOSIS — I495 Sick sinus syndrome: Secondary | ICD-10-CM | POA: Diagnosis not present

## 2015-07-18 NOTE — Progress Notes (Signed)
Remote pacemaker transmission.   

## 2015-08-10 LAB — CUP PACEART REMOTE DEVICE CHECK
Brady Statistic AP VS Percent: 94 %
Brady Statistic AS VP Percent: 0 %
Date Time Interrogation Session: 20170301223835
Implantable Lead Implant Date: 20090430
Implantable Lead Location: 753859
Implantable Lead Model: 5076
Lead Channel Impedance Value: 513 Ohm
Lead Channel Pacing Threshold Amplitude: 0.625 V
Lead Channel Pacing Threshold Pulse Width: 0.4 ms
Lead Channel Pacing Threshold Pulse Width: 0.4 ms
Lead Channel Setting Pacing Amplitude: 2 V
Lead Channel Setting Sensing Sensitivity: 5.6 mV
MDC IDC LEAD IMPLANT DT: 20090430
MDC IDC LEAD LOCATION: 753860
MDC IDC MSMT BATTERY IMPEDANCE: 755 Ohm
MDC IDC MSMT BATTERY REMAINING LONGEVITY: 70 mo
MDC IDC MSMT BATTERY VOLTAGE: 2.78 V
MDC IDC MSMT LEADCHNL RA IMPEDANCE VALUE: 440 Ohm
MDC IDC MSMT LEADCHNL RA PACING THRESHOLD AMPLITUDE: 0.5 V
MDC IDC MSMT LEADCHNL RV SENSING INTR AMPL: 8 mV
MDC IDC SET LEADCHNL RV PACING AMPLITUDE: 2.5 V
MDC IDC SET LEADCHNL RV PACING PULSEWIDTH: 0.4 ms
MDC IDC STAT BRADY AP VP PERCENT: 0 %
MDC IDC STAT BRADY AS VS PERCENT: 6 %

## 2015-08-16 ENCOUNTER — Encounter: Payer: Self-pay | Admitting: Cardiology

## 2015-10-06 ENCOUNTER — Encounter: Payer: Self-pay | Admitting: Cardiovascular Disease

## 2015-10-06 ENCOUNTER — Ambulatory Visit (INDEPENDENT_AMBULATORY_CARE_PROVIDER_SITE_OTHER): Payer: Managed Care, Other (non HMO) | Admitting: Cardiovascular Disease

## 2015-10-06 ENCOUNTER — Encounter (INDEPENDENT_AMBULATORY_CARE_PROVIDER_SITE_OTHER): Payer: Self-pay

## 2015-10-06 VITALS — BP 142/70 | HR 66 | Ht 70.0 in | Wt 205.2 lb

## 2015-10-06 DIAGNOSIS — R7309 Other abnormal glucose: Secondary | ICD-10-CM | POA: Diagnosis not present

## 2015-10-06 DIAGNOSIS — I480 Paroxysmal atrial fibrillation: Secondary | ICD-10-CM

## 2015-10-06 DIAGNOSIS — I10 Essential (primary) hypertension: Secondary | ICD-10-CM

## 2015-10-06 DIAGNOSIS — E785 Hyperlipidemia, unspecified: Secondary | ICD-10-CM | POA: Diagnosis not present

## 2015-10-06 DIAGNOSIS — R0989 Other specified symptoms and signs involving the circulatory and respiratory systems: Secondary | ICD-10-CM | POA: Diagnosis not present

## 2015-10-06 DIAGNOSIS — Z950 Presence of cardiac pacemaker: Secondary | ICD-10-CM

## 2015-10-06 DIAGNOSIS — I495 Sick sinus syndrome: Secondary | ICD-10-CM

## 2015-10-06 MED ORDER — DILTIAZEM HCL ER COATED BEADS 240 MG PO CP24
240.0000 mg | ORAL_CAPSULE | Freq: Every day | ORAL | Status: DC
Start: 2015-10-06 — End: 2016-10-16

## 2015-10-06 MED ORDER — SILDENAFIL CITRATE 100 MG PO TABS: 100.0000 mg | ORAL_TABLET | Freq: Every day | ORAL | Status: DC | PRN

## 2015-10-06 MED ORDER — SILDENAFIL CITRATE 20 MG PO TABS: 20.0000 mg | ORAL_TABLET | Freq: Three times a day (TID) | ORAL | Status: DC | PRN

## 2015-10-06 NOTE — Patient Instructions (Addendum)
You are doing well.  We will increase the diltiazem up to 240 mg daily (up from 180 mg daily)  We will check labs today (lipids, HBA1C)  Please call us if you have new issues that need to be addressed before your next appt.  Your physician wants you to follow-up in: 12 months.  You will receive a reminder letter in the mail two months in advance. If you don't receive a letter, please call our office to schedule the follow-up appointment.

## 2015-10-06 NOTE — Progress Notes (Signed)
Patient ID: Max Lozano, male   DOB: 1950-11-09, 65 y.o.   MRN: [id number] Cardiology Office Note  Date:  10/06/2015   ID:  Max Lozano, DOB 05/20/1950, MRN [id number]  PCP:  Lelon Huh, MD   Chief Complaint  Patient presents with  . other    12 Month F/U. Pt C/O of palpitations. Medications verbally reviewed with patient.     HPI:  Max Lozano is a 65 year old male with a history of hypertension, atrial fibrillation c/b tachy-brady syndrome s/p pacemaker. ETT 08/2007 with excellent exercise capacity no evidence of ischemia who presents for routine followup of his atrial fibrillation.  In follow-up today, he reports that he is doing well He does report having tachycardia lasting for approximately 10 minutes that typically presents after he eats. This occurs at least twice per week Otherwise feels well without any significant symptoms  Pacer download reviewed with him showing increase in the atrial fibrillation burden now 2.2%. He has been taking aspirin, No recent follow-up with primary care for lab work Prior lab work showing glucose typically over 100  Records reviewed showing mild carotid arterial disease bilaterally (bruit on exam out of proportion to his disease from 2014, suspect tortuous vessel)  He is interested in obtaining Viagra samples for erectile dysfunction  EKG on today's visit shows normal sinus rhythm, paced  Hospitalized August 16 2014 with discharge 08/18/2014 with upper respiratory infection Acute onset of shortness of breath, treated without biotics  significant family history of coronary artery disease with father who had MI in his late 70s, he was a smoker, mother with congestive heart failure in her 28s.    PMH:   has a past medical history of Arrhythmia; Sick sinus syndrome ([place]); Syncope and collapse; Hypertension; Hyperlipidemia; PPM-Medtronic (10/11/2009); Motion sickness; and Wears dentures.  PSH:    Past Surgical History  Procedure Laterality Date   . Insert / replace / remove pacemaker  09/04/07    Medtronic  . Colonoscopy with propofol N/A 03/25/2015    Procedure: COLONOSCOPY WITH PROPOFOL;  Surgeon: Lucilla Lame, MD;  Location: [facility];  Service: Endoscopy;  Laterality: N/A;  ULCER ASCENDING COLON BX.     Current Outpatient Prescriptions  Medication Sig Dispense Refill  . aspirin 81 MG EC tablet Take 81 mg by mouth daily.      Marland Kitchen atorvastatin (LIPITOR) 10 MG tablet Take 1 tablet (10 mg total) by mouth daily. 90 tablet 3  . diltiazem (CARDIZEM CD) 240 MG 24 hr capsule Take 1 capsule (240 mg total) by mouth daily. 30 capsule 11  . fish oil-omega-3 fatty acids 1000 MG capsule Take 1 g by mouth 2 (two) times daily.      Marland Kitchen lisinopril-hydrochlorothiazide (PRINZIDE,ZESTORETIC) 20-12.5 MG per tablet Take 1 tablet by mouth daily. 90 tablet 3  . Na Sulfate-K Sulfate-Mg Sulf (SUPREP BOWEL PREP) SOLN Take 1 kit by mouth as directed. 1 Bottle 0  . sildenafil (REVATIO) 20 MG tablet Take 1 tablet (20 mg total) by mouth 3 (three) times daily as needed. 60 tablet 1  . sildenafil (VIAGRA) 100 MG tablet Take 1 tablet (100 mg total) by mouth daily as needed for erectile dysfunction. 6 tablet 12   No current facility-administered medications for this visit.     Allergies:   Atenolol   Social History:  The patient  reports that he has never smoked. He does not have any smokeless tobacco history on file. He reports that he does not drink alcohol or use  illicit drugs.   Family History:   family history includes Diabetes in his mother; Heart attack in his father.    Review of Systems: Review of Systems  Constitutional: Negative.   Respiratory: Negative.   Cardiovascular: Negative.   Gastrointestinal: Negative.   Musculoskeletal: Negative.   Neurological: Negative.   Psychiatric/Behavioral: Negative.   All other systems reviewed and are negative.    PHYSICAL EXAM: VS:  BP 142/70 mmHg  Pulse 66  Ht _0  (1.778 m)  Wt 205 lb  3.2 oz (93.078 kg)  BMI 29.44 kg/m2 , BMI Body mass index is 29.44 kg/(m^2). GEN: Well nourished, well developed, in no acute distress HEENT: normal Neck: no JVD,  + b/l carotid bruits 2+, no masses Cardiac: RRR; no murmurs, rubs, or gallops,no edema  Respiratory:  clear to auscultation bilaterally, normal work of breathing GI: soft, nontender, nondistended, + BS MS: no deformity or atrophy Skin: warm and dry, no rash Neuro:  Strength and sensation are intact Psych: euthymic mood, full affect    Recent Labs: No results found for requested labs within last 365 days.   Labs will be drawn in the office today  Lipid Panel No results found for: CHOL, HDL, LDLCALC, TRIG  Labs will be drawn in the office today  Wt Readings from Last 3 Encounters:  10/06/15 205 lb 3.2 oz (93.078 kg)  03/25/15 186 lb (84.369 kg)  10/01/14 184 lb 12 oz (83.802 kg)       ASSESSMENT AND PLAN:  Hyperlipidemia - Lipid panel drawn today in the office No recent lab work available for review He has not seen primary care Recommended he continue on Lipitor 10 mg daily  Elevated glucose -  Several lab draws dating back for several years have shown elevated glucose levels concerning for prediabetes or diabetes. We'll draw hemoglobin A1c today  Paroxysmal atrial fibrillation (HCC) Atrial fib burden has increased, 2.2% on recent pacer download We have evaluated his CHADS VASC (this was discussed with the patient) and as he does have carotid arterial disease, suspected diabetes, male with hypertension, number is 3 Given his paroxysmal atrial fibrillation, may be a candidate for anticoagulation. We'll wait until lab work comes back, discussed with Dr. Caryl Comes who he sees in 2 weeks' time We did suggest he increase diltiazem up to 240 mg daily for rhythm control  Right carotid bruit Previous carotid ultrasound in 2014 showing mild bilateral carotid disease  PPM-Medtronic Followed by Dr. Patricia Nettle  node dysfunction [facility]) Pacemaker, no symptoms  HYPERTENSION, BENIGN ESSENTIAL Diltiazem increased as above  Disposition:   F/U  6 months   Total encounter time more than 25 minutes  Greater than 50% was spent in counseling and coordination of care with the patient    Orders Placed This Encounter  Procedures  . Lipid Profile  . HgB A1c   Signed, Esmond Plants, M.D., Ph.D. 10/06/2015  [place], [place]

## 2015-10-07 ENCOUNTER — Encounter: Payer: Self-pay | Admitting: Cardiovascular Disease

## 2015-10-07 LAB — HEMOGLOBIN A1C
Est. average glucose Bld gHb Est-mCnc: 140 mg/dL
HEMOGLOBIN A1C: 6.5 % — AB (ref 4.8–5.6)

## 2015-10-07 LAB — LIPID PANEL
Chol/HDL Ratio: 4.2 ratio units (ref 0.0–5.0)
Cholesterol, Total: 157 mg/dL (ref 100–199)
HDL: 37 mg/dL — AB (ref >39)
LDL CALC: 78 mg/dL (ref 0–99)
Triglycerides: 211 mg/dL — ABNORMAL HIGH (ref 0–149)
VLDL CHOLESTEROL CAL: 42 mg/dL — AB (ref 5–40)

## 2015-10-11 ENCOUNTER — Telehealth: Payer: Self-pay | Admitting: Cardiovascular Disease

## 2015-10-11 NOTE — Telephone Encounter (Signed)
Patient wants lab results.  Please call

## 2015-10-11 NOTE — Telephone Encounter (Signed)
Dr. Mariah MillingGollan is out of the office this week.  Will call pt w/ his recommendation when he returns.

## 2015-10-18 ENCOUNTER — Encounter: Payer: Self-pay | Admitting: Internal Medicine

## 2015-10-18 ENCOUNTER — Telehealth: Payer: Self-pay | Admitting: Internal Medicine

## 2015-10-18 ENCOUNTER — Ambulatory Visit (INDEPENDENT_AMBULATORY_CARE_PROVIDER_SITE_OTHER): Payer: Managed Care, Other (non HMO) | Admitting: Internal Medicine

## 2015-10-18 VITALS — BP 136/72 | HR 73 | Ht 70.0 in | Wt 205.5 lb

## 2015-10-18 DIAGNOSIS — I495 Sick sinus syndrome: Secondary | ICD-10-CM | POA: Diagnosis not present

## 2015-10-18 DIAGNOSIS — Z950 Presence of cardiac pacemaker: Secondary | ICD-10-CM

## 2015-10-18 DIAGNOSIS — I480 Paroxysmal atrial fibrillation: Secondary | ICD-10-CM

## 2015-10-18 NOTE — Telephone Encounter (Signed)
Patient has decided on blood thinner .  Please call to discuss.  Patient said he wants to take xarelto.

## 2015-10-18 NOTE — Progress Notes (Signed)
Patient Care Team: Malva Limes, MD as PCP - General (Family Medicine) Antonieta Iba, MD as Consulting Physician (Cardiology)   HPI  Max Lozano is a 65 y.o. male Has hx of pacer implanted for symptomatic bradycardia and atrial fibrillaton CHADSVASC=2(HTN/DM) the latter recently diagnosed with a hemoglobin A1c of 6.5  While he has lost weight he still has some sleep disordered breathing and day time somnolence  The patient denies chest pain, shortness of breath, nocturnal dyspnea, orthopnea or peripheral edema.  There have been no palpitations, lightheadedness or syncope.    He has lost 50 lbs  And has had unrelated to this an intercurrent bronchitis and pneumonia ffrom which he is gradually recovering   Past Medical History  Diagnosis Date  . Arrhythmia     Paroxysmal atrial fib   . Sick sinus syndrome (HCC)     s/p pacer  . Syncope and collapse   . Hypertension   . Hyperlipidemia   . PPM-Medtronic 10/11/2009    Qualifier: Diagnosis of  By: Ladona Ridgel, MD, Jerrell Mylar   . Motion sickness     deep sea boats  . Wears dentures     full upper and lower    Past Surgical History  Procedure Laterality Date  . Insert / replace / remove pacemaker  09/04/07    Medtronic  . Colonoscopy with propofol N/A 03/25/2015    Procedure: COLONOSCOPY WITH PROPOFOL;  Surgeon: Midge Minium, MD;  Location: [facility];  Service: Endoscopy;  Laterality: N/A;  ULCER ASCENDING COLON BX.     Current Outpatient Prescriptions  Medication Sig Dispense Refill  . aspirin 81 MG EC tablet Take 81 mg by mouth daily.      Marland Kitchen atorvastatin (LIPITOR) 10 MG tablet Take 1 tablet (10 mg total) by mouth daily. 90 tablet 3  . diltiazem (CARDIZEM CD) 240 MG 24 hr capsule Take 1 capsule (240 mg total) by mouth daily. 30 capsule 11  . fish oil-omega-3 fatty acids 1000 MG capsule Take 1 g by mouth 2 (two) times daily.      Marland Kitchen lisinopril-hydrochlorothiazide (PRINZIDE,ZESTORETIC) 20-12.5 MG per  tablet Take 1 tablet by mouth daily. 90 tablet 3   No current facility-administered medications for this visit.    Allergies  Allergen Reactions  . Atenolol Rash    Review of Systems negative except from HPI and PMH  Physical Exam BP 136/72 mmHg  Pulse 73  Ht  (1.778 m)  Wt 205 lb 8 oz (93.214 kg)  BMI 29.49 kg/m2 Well developed and nourished in no acute distress HENT normal Neck supple with JVP-flat Clear Device pocket well healed; without hematoma or erythema.  There is no tethering Regular rate and rhythm, no murmurs or gallops Abd-soft with active BS No Clubbing cyanosis edema Skin-warm and dry A & Oriented  Grossly normal sensory and motor function    Assessment and  Plan  Sinus node dysfunction  100% A paced with reasonable excursion and good exercise tolerance  Paroxysmal atrial fibrillation  Pacemaker-Medtronic The patient's device was interrogated.  The information was reviewed. No changes were made in the programming.     Hypertension  Diabetes  Sleep disordered breathing  We discussed the use of the NOACs compared to Coumadin. We briefly reviewed the data of at least comparability in stroke prevention, bleeding and outcome. We discussed some of the new once wherein somewhat associated with decreased ischemic stroke risk, one to be taken daily, and  has been shown to be comparable and bleeding risk to aspirin.  We also discussed bleeding associated with warfarin as well as NOACs and a wall bleeding as a complication of all these drugs intracranial bleeding is more frequently associated with warfarin then the NOACs and a GI bleeding is more commonly associated with the latter  We will check with his drug is regarding costs of the NOACs. He will let us know. We will discontinue his aspirin.       BP well controlled  I will ask Dr. Sherrie MustacheFisher to consider a sleep study

## 2015-10-18 NOTE — Patient Instructions (Signed)
Medication Instructions: - Please call Dr. Odessa FlemingKlein's nurse Herbert SetaHeather, at [phone] when you decide which blood thinner you want to start. 1) Eliquis- twice daily 2) Pradaxa- twice daily 3) Xarelto- once daily  Labwork: - none  Procedures/Testing: - none  Follow-Up: - Remote monitoring is used to monitor your Pacemaker of ICD from home. This monitoring reduces the number of office visits required to check your device to one time per year. It allows us to keep an eye on the functioning of your device to ensure it is working properly. You are scheduled for a device check from home on 01/17/16. You may send your transmission at any time that day. If you have a wireless device, the transmission will be sent automatically. After your physician reviews your transmission, you will receive a postcard with your next transmission date.  - Your physician wants you to follow-up in: 1 year with Dr. Graciela HusbandsKlein. You will receive a reminder letter in the mail two months in advance. If you don't receive a letter, please call our office to schedule the follow-up appointment.  Any Additional Special Instructions Will Be Listed Below (If Applicable).     If you need a refill on your cardiac medications before your next appointment, please call your pharmacy.

## 2015-10-20 ENCOUNTER — Encounter: Payer: Self-pay | Admitting: *Deleted

## 2015-10-20 MED ORDER — RIVAROXABAN 20 MG PO TABS
20.0000 mg | ORAL_TABLET | Freq: Every day | ORAL | Status: DC
Start: 2015-10-20 — End: 2016-11-19

## 2015-10-20 NOTE — Addendum Note (Signed)
Addended by: Sherri RadMCGHEE, HEATHER C on: 10/20/2015 10:27 AM   Modules accepted: Orders, Medications

## 2015-10-20 NOTE — Telephone Encounter (Signed)
Patient has decided on blood thinner  Patient said he wants to take [facility] [place]

## 2015-10-20 NOTE — Telephone Encounter (Signed)
I called and spoke with the patient. I advised him I have pulled him samples of Xarelto 20 mg tablets- take one tablet by mouth at supper time. I also advised him I have pulled the free 30-day trial card & co-pay card for him. He is agreeable with coming to the office to pick these up.  Will order a repeat CBC/ BMP to be done in about 4 weeks.

## 2015-10-25 LAB — CUP PACEART INCLINIC DEVICE CHECK
Battery Impedance: 858 Ohm
Battery Remaining Longevity: 66 mo
Battery Voltage: 2.78 V
Brady Statistic AS VP Percent: 0 %
Date Time Interrogation Session: 20170613160109
Implantable Lead Implant Date: 20090430
Implantable Lead Location: 753860
Implantable Lead Model: 5076
Lead Channel Impedance Value: 458 Ohm
Lead Channel Pacing Threshold Pulse Width: 0.4 ms
Lead Channel Setting Pacing Amplitude: 2 V
Lead Channel Setting Pacing Amplitude: 2.5 V
Lead Channel Setting Pacing Pulse Width: 0.4 ms
MDC IDC LEAD IMPLANT DT: 20090430
MDC IDC LEAD LOCATION: 753859
MDC IDC MSMT LEADCHNL RA PACING THRESHOLD AMPLITUDE: 0.5 V
MDC IDC MSMT LEADCHNL RV IMPEDANCE VALUE: 488 Ohm
MDC IDC MSMT LEADCHNL RV PACING THRESHOLD AMPLITUDE: 0.75 V
MDC IDC MSMT LEADCHNL RV PACING THRESHOLD PULSEWIDTH: 0.4 ms
MDC IDC MSMT LEADCHNL RV SENSING INTR AMPL: 15.67 mV
MDC IDC SET LEADCHNL RV SENSING SENSITIVITY: 5.6 mV
MDC IDC STAT BRADY AP VP PERCENT: 0 %
MDC IDC STAT BRADY AP VS PERCENT: 94 %
MDC IDC STAT BRADY AS VS PERCENT: 6 %

## 2015-11-05 ENCOUNTER — Other Ambulatory Visit: Payer: Self-pay | Admitting: Cardiovascular Disease

## 2015-11-07 ENCOUNTER — Other Ambulatory Visit: Payer: Self-pay | Admitting: *Deleted

## 2015-11-07 MED ORDER — ATORVASTATIN CALCIUM 10 MG PO TABS: 10.0000 mg | ORAL_TABLET | Freq: Every day | ORAL | Status: DC

## 2015-11-07 NOTE — Telephone Encounter (Signed)
Requested Prescriptions   Signed Prescriptions Disp Refills  . atorvastatin (LIPITOR) 10 MG tablet 90 tablet 3    Sig: Take 1 tablet (10 mg total) by mouth daily.    Authorizing Provider: GOLLAN, TIMOTHY J    Ordering User: Gwendolen Hewlett C    

## 2015-11-24 ENCOUNTER — Encounter: Payer: Self-pay | Admitting: Internal Medicine

## 2016-01-17 ENCOUNTER — Ambulatory Visit (INDEPENDENT_AMBULATORY_CARE_PROVIDER_SITE_OTHER): Payer: Managed Care, Other (non HMO) | Admitting: *Deleted

## 2016-01-17 ENCOUNTER — Telehealth: Payer: Self-pay | Admitting: Cardiology

## 2016-01-17 DIAGNOSIS — I495 Sick sinus syndrome: Secondary | ICD-10-CM

## 2016-01-17 NOTE — Telephone Encounter (Signed)
LMOVM reminding pt to send remote transmission.   

## 2016-01-18 NOTE — Progress Notes (Signed)
Remote pacemaker transmission.   

## 2016-01-19 ENCOUNTER — Encounter: Payer: Self-pay | Admitting: Cardiology

## 2016-01-26 LAB — CUP PACEART REMOTE DEVICE CHECK
Battery Impedance: 962 Ohm
Battery Remaining Longevity: 61 mo
Battery Voltage: 2.78 V
Brady Statistic AP VP Percent: 0 %
Date Time Interrogation Session: 20170912193516
Implantable Lead Location: 753860
Lead Channel Impedance Value: 421 Ohm
Lead Channel Pacing Threshold Amplitude: 0.375 V
Lead Channel Setting Pacing Pulse Width: 0.4 ms
MDC IDC LEAD IMPLANT DT: 20090430
MDC IDC LEAD IMPLANT DT: 20090430
MDC IDC LEAD LOCATION: 753859
MDC IDC MSMT LEADCHNL RA PACING THRESHOLD PULSEWIDTH: 0.4 ms
MDC IDC MSMT LEADCHNL RV IMPEDANCE VALUE: 483 Ohm
MDC IDC MSMT LEADCHNL RV PACING THRESHOLD AMPLITUDE: 0.75 V
MDC IDC MSMT LEADCHNL RV PACING THRESHOLD PULSEWIDTH: 0.4 ms
MDC IDC MSMT LEADCHNL RV SENSING INTR AMPL: 11.2 mV
MDC IDC SET LEADCHNL RA PACING AMPLITUDE: 2 V
MDC IDC SET LEADCHNL RV PACING AMPLITUDE: 2.5 V
MDC IDC SET LEADCHNL RV SENSING SENSITIVITY: 5.6 mV
MDC IDC STAT BRADY AP VS PERCENT: 92 %
MDC IDC STAT BRADY AS VP PERCENT: 0 %
MDC IDC STAT BRADY AS VS PERCENT: 8 %

## 2016-04-14 IMAGING — CR DG CHEST 2V
1 series · 2 of 2 positions shown · non-contrast
Comparison: 09/16/2014

CLINICAL DATA: Followup pneumonia

EXAM:
CHEST  2 VIEW

[Series 1: dg chest 2 view · 0.14mm/px · 2 of 2 slices shown]
[im 1/2]
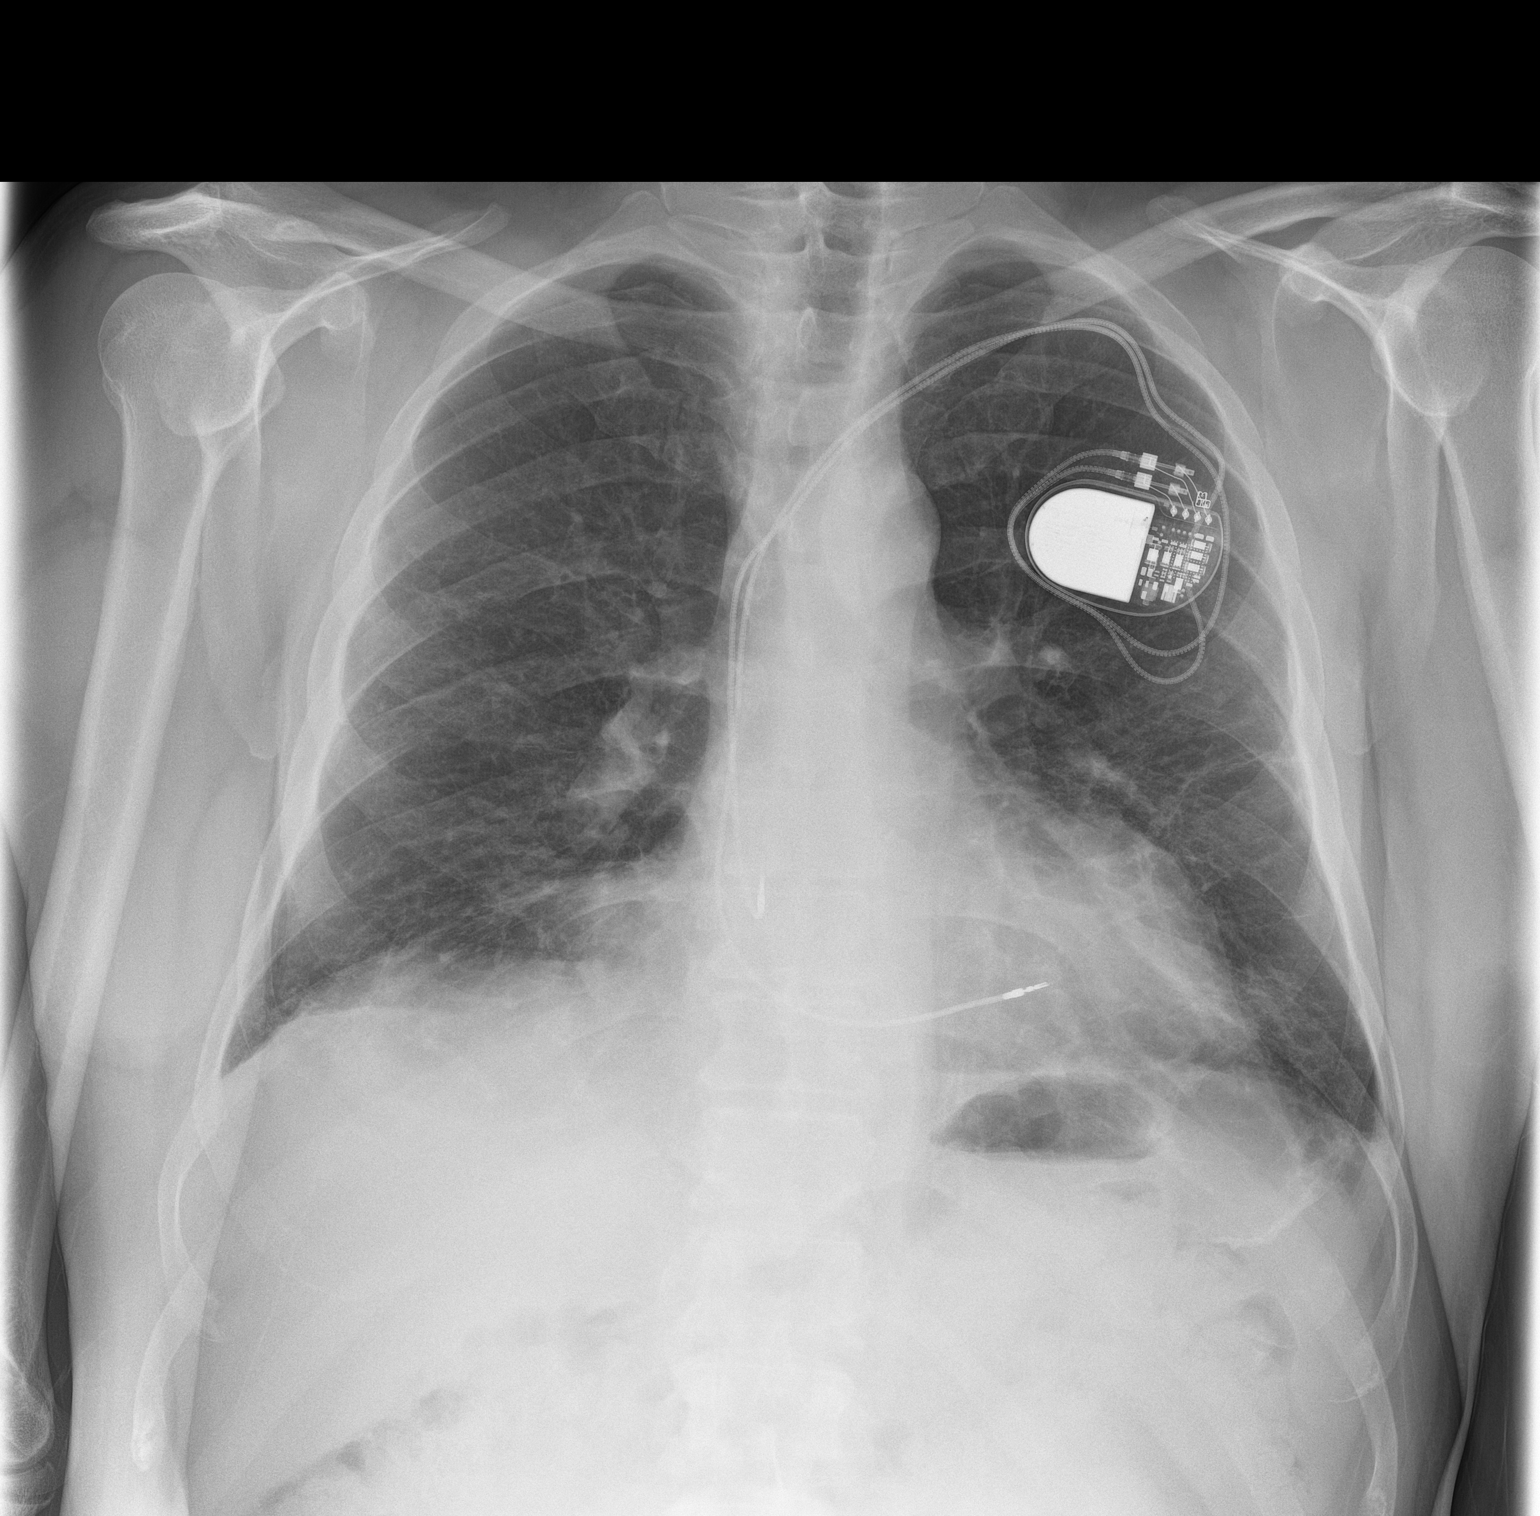
[im 2/2]
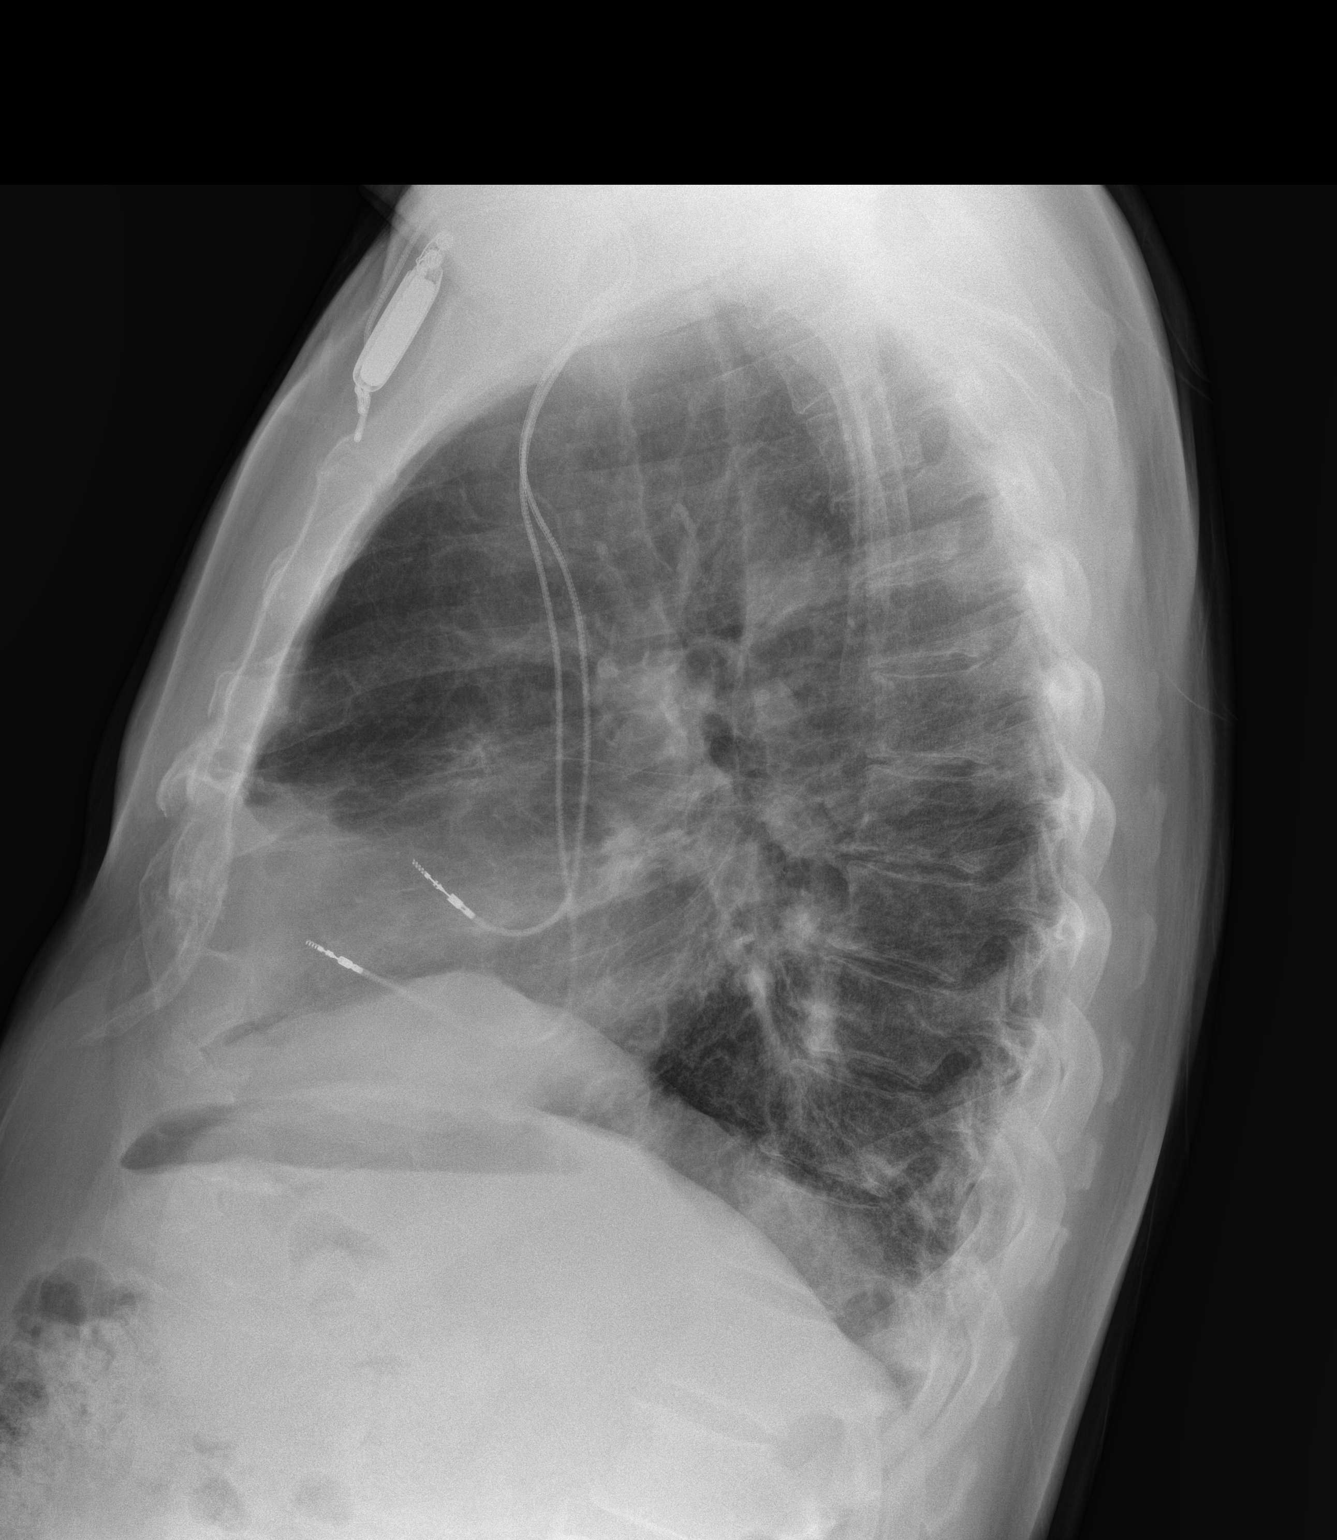

[2 of 2 positions shown; findings below may reference images not displayed]

FINDINGS: Bilateral central and basilar heterogeneous opacities have improved.
There is persistent patchy airspace disease. Dual lead left
subclavian pacemaker device and leads are stable and intact. No
pneumothorax. No pleural effusion.
IMPRESSION: Improving bibasilar patchy airspace disease. This supports
inflammatory etiology. Continued followup until complete resolution
is recommended.

## 2016-04-17 ENCOUNTER — Ambulatory Visit (INDEPENDENT_AMBULATORY_CARE_PROVIDER_SITE_OTHER): Payer: Managed Care, Other (non HMO) | Admitting: *Deleted

## 2016-04-17 ENCOUNTER — Telehealth: Payer: Self-pay | Admitting: Cardiology

## 2016-04-17 DIAGNOSIS — I495 Sick sinus syndrome: Secondary | ICD-10-CM

## 2016-04-17 NOTE — Telephone Encounter (Signed)
LMOVM reminding pt to send remote transmission.   

## 2016-04-18 NOTE — Progress Notes (Signed)
Remote pacemaker transmission.   

## 2016-04-25 ENCOUNTER — Encounter: Payer: Self-pay | Admitting: Cardiology

## 2016-05-02 LAB — CUP PACEART REMOTE DEVICE CHECK
Brady Statistic AP VP Percent: 0 %
Brady Statistic AS VP Percent: 0 %
Brady Statistic AS VS Percent: 8 %
Date Time Interrogation Session: 20171212204245
Implantable Lead Implant Date: 20090430
Implantable Lead Location: 753859
Lead Channel Pacing Threshold Amplitude: 0.375 V
Lead Channel Pacing Threshold Amplitude: 0.875 V
Lead Channel Pacing Threshold Pulse Width: 0.4 ms
Lead Channel Sensing Intrinsic Amplitude: 11.2 mV
Lead Channel Setting Sensing Sensitivity: 5.6 mV
MDC IDC LEAD IMPLANT DT: 20090430
MDC IDC LEAD LOCATION: 753860
MDC IDC MSMT BATTERY IMPEDANCE: 1015 Ohm
MDC IDC MSMT BATTERY REMAINING LONGEVITY: 59 mo
MDC IDC MSMT BATTERY VOLTAGE: 2.78 V
MDC IDC MSMT LEADCHNL RA IMPEDANCE VALUE: 447 Ohm
MDC IDC MSMT LEADCHNL RA PACING THRESHOLD PULSEWIDTH: 0.4 ms
MDC IDC MSMT LEADCHNL RV IMPEDANCE VALUE: 509 Ohm
MDC IDC PG IMPLANT DT: 20090430
MDC IDC SET LEADCHNL RA PACING AMPLITUDE: 2 V
MDC IDC SET LEADCHNL RV PACING AMPLITUDE: 2.5 V
MDC IDC SET LEADCHNL RV PACING PULSEWIDTH: 0.4 ms
MDC IDC STAT BRADY AP VS PERCENT: 91 %

## 2016-07-17 ENCOUNTER — Ambulatory Visit (INDEPENDENT_AMBULATORY_CARE_PROVIDER_SITE_OTHER): Payer: Managed Care, Other (non HMO) | Admitting: *Deleted

## 2016-07-17 ENCOUNTER — Telehealth: Payer: Self-pay | Admitting: Cardiology

## 2016-07-17 DIAGNOSIS — I495 Sick sinus syndrome: Secondary | ICD-10-CM | POA: Diagnosis not present

## 2016-07-17 NOTE — Telephone Encounter (Signed)
Spoke with pt and reminded pt of remote transmission that is due today. Pt verbalized understanding.   

## 2016-07-18 ENCOUNTER — Encounter: Payer: Self-pay | Admitting: Cardiology

## 2016-07-18 LAB — CUP PACEART REMOTE DEVICE CHECK
Battery Impedance: 1068 Ohm
Brady Statistic AP VP Percent: 0 %
Brady Statistic AP VS Percent: 89 %
Brady Statistic AS VS Percent: 10 %
Date Time Interrogation Session: 20180313193213
Implantable Lead Implant Date: 20090430
Implantable Lead Location: 753859
Implantable Lead Location: 753860
Implantable Lead Model: 5076
Lead Channel Impedance Value: 478 Ohm
Lead Channel Impedance Value: 480 Ohm
Lead Channel Pacing Threshold Amplitude: 0.5 V
Lead Channel Pacing Threshold Amplitude: 0.75 V
Lead Channel Sensing Intrinsic Amplitude: 16 mV
Lead Channel Setting Pacing Amplitude: 2 V
MDC IDC LEAD IMPLANT DT: 20090430
MDC IDC MSMT BATTERY REMAINING LONGEVITY: 58 mo
MDC IDC MSMT BATTERY VOLTAGE: 2.78 V
MDC IDC MSMT LEADCHNL RA PACING THRESHOLD PULSEWIDTH: 0.4 ms
MDC IDC MSMT LEADCHNL RV PACING THRESHOLD PULSEWIDTH: 0.4 ms
MDC IDC PG IMPLANT DT: 20090430
MDC IDC SET LEADCHNL RV PACING AMPLITUDE: 2.5 V
MDC IDC SET LEADCHNL RV PACING PULSEWIDTH: 0.4 ms
MDC IDC SET LEADCHNL RV SENSING SENSITIVITY: 5.6 mV
MDC IDC STAT BRADY AS VP PERCENT: 0 %

## 2016-07-18 NOTE — Progress Notes (Signed)
Remote pacemaker transmission.   

## 2016-10-16 ENCOUNTER — Other Ambulatory Visit: Payer: Self-pay | Admitting: Cardiovascular Disease

## 2016-11-04 NOTE — Progress Notes (Addendum)
Patient ID: Max Lozano, male   DOB: 1951/02/11, 66 y.o.   MRN: [id number] Cardiology Office Note  Date:  11/06/2016   ID:  Max Lozano, DOB 1950/05/09, MRN [id number]  PCP:  Malva Limes, MD   Chief Complaint  Patient presents with  . OTHER    12 Month f/u no complaints today. Meds reviewed verbally with pt.    HPI:  Max Lozano is a 66 year old male with a history of  hypertension,  Paroxysmal atrial fibrillation (Relatively asymptomatic),  tachy-brady syndrome  s/p pacemaker  ETT 08/2007 with excellent exercise capacity no evidence of ischemia  ECHO 2009: EF 55% Carotid ultrasound 2014 with minimal plaque who presents for routine followup of his atrial fibrillation.  On today's visit reports he feels fine with no significant complaints  Weed-eating yesterday, got hot, went in the house Heart maybe racing A little, but seemed to settle down  EKG personally reviewed by myself on today's visit showing atrial fibrillation with rate 88 bpm   he is unaware of the rhythm change Previous pacemaker downloads reviewed with him in detail  Escalation in frequency of atrial fibrillation over the past year   Pacer download 07/2016: 14% atrial fib (up from 11% in 04/216) was 2.2 % in 10/2015 Again he is relatively asymptomatic Tolerating anticoagulation    denies any chest pain or shortness of breath on exertion, active at baseline  Lab work reviewed with him in detail  HBa1C 6.5 togtal chol 157, LDL 78   other past medical history reviewed  Records reviewed showing mild carotid arterial disease bilaterally (bruit on exam out of proportion to his disease from 2014, suspect tortuous vessel)  Hospitalized August 16 2014 with discharge 08/18/2014 with upper respiratory infection Acute onset of shortness of breath, treated without biotics  significant family history of coronary artery disease with father who had MI in his late 36s, he was a smoker, mother with congestive heart failure  in her 16s.    PMH:   has a past medical history of Arrhythmia; Hyperlipidemia; Hypertension; Motion sickness; PPM-Medtronic (10/11/2009); Sick sinus syndrome (HCC); Syncope and collapse; and Wears dentures.  PSH:    Past Surgical History:  Procedure Laterality Date  . COLONOSCOPY WITH PROPOFOL N/A 03/25/2015   Procedure: COLONOSCOPY WITH PROPOFOL;  Surgeon: Midge Minium, MD;  Location: [facility];  Service: Endoscopy;  Laterality: N/A;  ULCER ASCENDING COLON BX.   [name] / REPLACE / REMOVE PACEMAKER  09/04/07   Medtronic    Current Outpatient Prescriptions  Medication Sig Dispense Refill  . atorvastatin (LIPITOR) 10 MG tablet Take 1 tablet (10 mg total) by mouth daily. 90 tablet 3  . CARTIA XT 240 MG 24 hr capsule TAKE 1 CAPSULE(240 MG) BY MOUTH DAILY 30 capsule 0  . fish oil-omega-3 fatty acids 1000 MG capsule Take 1 g by mouth 2 (two) times daily.      Marland Kitchen lisinopril-hydrochlorothiazide (PRINZIDE,ZESTORETIC) 20-12.5 MG tablet TAKE 1 TABLET BY MOUTH ONCE DAILY 90 tablet 3  . rivaroxaban (XARELTO) 20 MG TABS tablet Take 1 tablet (20 mg total) by mouth daily with supper. 30 tablet 11   No current facility-administered medications for this visit.      Allergies:   Atenolol   Social History:  The patient  reports that he has never smoked. He has never used smokeless tobacco. He reports that he does not drink alcohol or use drugs.   Family History:   family history includes Diabetes in his mother;  Heart attack in his father.    Review of Systems: Review of Systems  Constitutional: Negative.   Respiratory: Negative.   Cardiovascular: Negative.   Gastrointestinal: Negative.   Musculoskeletal: Negative.   Neurological: Negative.   Psychiatric/Behavioral: Negative.   All other systems reviewed and are negative.    PHYSICAL EXAM: VS:  BP 124/86 (BP Location: Left Arm, Patient Position: Sitting, Cuff Size: Normal)   Pulse 88   Ht 5\' 10"  (1.778 m)   Wt 217 lb 8 oz (98.7  kg)   BMI 31.21 kg/m  , BMI Body mass index is 31.21 kg/m. GEN: Well nourished, well developed, in no acute distress  HEENT: normal  Neck: no JVD,  + b/l carotid bruits 2+, no masses CardiacIrregularly irregular,no murmurs, rubs, or gallops,no edema  Respiratory:  clear to auscultation bilaterally, normal work of breathing GI: soft, nontender, nondistended, + BS MS: no deformity or atrophy  Skin: warm and dry, no rash Neuro:  Strength and sensation are intact Psych: euthymic mood, full affect    Recent Labs: No results found for requested labs within last 8760 hours.   Labs will be drawn in the office today  Lipid Panel Lab Results  Component Value Date   CHOL 157 10/06/2015   HDL 37 (L) 10/06/2015   LDLCALC 78 10/06/2015   TRIG 211 (H) 10/06/2015    Labs will be drawn in the office today  Wt Readings from Last 3 Encounters:  11/06/16 217 lb 8 oz (98.7 kg)  10/18/15 205 lb 8 oz (93.2 kg)  10/06/15 205 lb 3.2 oz (93.1 kg)       ASSESSMENT AND PLAN:  Hyperlipidemia - Cholesterol is at goal on the current lipid regimen. No changes to the medications were made.  Elevated glucose -  Hemoglobin A1c 6.5  We have encouraged continued exercise, careful diet management in an effort to lose weight.  Paroxysmal atrial fibrillation (HCC) Increasing Atrial fib burden over the past year   still relatively asymptomatic. Did not seem to respond to higher dose diltiazem We'll discuss with Dr. Graciela HusbandsKlein,  Unclear if antiarrhythmic is needed as he is relatively asymptomatic If he does develop symptoms in the future, could start medication such as flecainide He has appointment with Dr. Graciela HusbandsKlein later this morning.  Carotid stenosis  ultrasound in 2014 showing mild bilateral carotid disease  PPM-Medtronic Followed by Dr. Arturo MortonKlein  Sinoatrial node dysfunction [facility](HCC) Pacemaker, no symptoms Pacer checked later today   HYPERTENSION, BENIGN ESSENTIAL Recommended he stay on diltiazem    Disposition:   F/U  6 months   Total encounter time more than 25 minutes  Greater than 50% was spent in counseling and coordination of care with the patient    Orders Placed This Encounter  Procedures  . EKG 12-Lead   Signed, Dossie Arbourim Arzella Rehmann, M.D., Ph.D. 11/06/2016  [facility] [place] [phone]

## 2016-11-06 ENCOUNTER — Encounter: Payer: Self-pay | Admitting: Cardiovascular Disease

## 2016-11-06 ENCOUNTER — Encounter: Payer: Self-pay | Admitting: Internal Medicine

## 2016-11-06 ENCOUNTER — Ambulatory Visit (INDEPENDENT_AMBULATORY_CARE_PROVIDER_SITE_OTHER): Payer: 59 | Admitting: Cardiovascular Disease

## 2016-11-06 ENCOUNTER — Ambulatory Visit (INDEPENDENT_AMBULATORY_CARE_PROVIDER_SITE_OTHER): Payer: 59 | Admitting: Internal Medicine

## 2016-11-06 VITALS — BP 124/86 | HR 88 | Ht 70.0 in | Wt 217.5 lb

## 2016-11-06 VITALS — BP 124/86 | HR 88 | Ht 70.0 in | Wt 217.0 lb

## 2016-11-06 DIAGNOSIS — I480 Paroxysmal atrial fibrillation: Secondary | ICD-10-CM

## 2016-11-06 DIAGNOSIS — I495 Sick sinus syndrome: Secondary | ICD-10-CM

## 2016-11-06 DIAGNOSIS — Z950 Presence of cardiac pacemaker: Secondary | ICD-10-CM | POA: Diagnosis not present

## 2016-11-06 DIAGNOSIS — E782 Mixed hyperlipidemia: Secondary | ICD-10-CM

## 2016-11-06 DIAGNOSIS — I10 Essential (primary) hypertension: Secondary | ICD-10-CM | POA: Diagnosis not present

## 2016-11-06 LAB — CUP PACEART INCLINIC DEVICE CHECK
Battery Impedance: 1228 Ohm
Battery Remaining Longevity: 54 mo
Battery Voltage: 2.78 V
Brady Statistic AP VP Percent: 0 %
Brady Statistic AP VS Percent: 88 %
Brady Statistic AS VP Percent: 0 %
Date Time Interrogation Session: 20180703144856
Implantable Lead Implant Date: 20090430
Implantable Lead Location: 753859
Implantable Lead Model: 5076
Implantable Lead Model: 5076
Implantable Pulse Generator Implant Date: 20090430
Lead Channel Impedance Value: 549 Ohm
Lead Channel Impedance Value: 553 Ohm
Lead Channel Pacing Threshold Amplitude: 0.625 V
Lead Channel Pacing Threshold Amplitude: 0.75 V
Lead Channel Pacing Threshold Pulse Width: 0.4 ms
Lead Channel Sensing Intrinsic Amplitude: 0.5 mV
Lead Channel Sensing Intrinsic Amplitude: 15.67 mV
Lead Channel Setting Sensing Sensitivity: 5.6 mV
MDC IDC LEAD IMPLANT DT: 20090430
MDC IDC LEAD LOCATION: 753860
MDC IDC MSMT LEADCHNL RA PACING THRESHOLD PULSEWIDTH: 0.4 ms
MDC IDC MSMT LEADCHNL RV PACING THRESHOLD AMPLITUDE: 0.75 V
MDC IDC MSMT LEADCHNL RV PACING THRESHOLD PULSEWIDTH: 0.4 ms
MDC IDC SET LEADCHNL RA PACING AMPLITUDE: 2 V
MDC IDC SET LEADCHNL RV PACING AMPLITUDE: 2.5 V
MDC IDC SET LEADCHNL RV PACING PULSEWIDTH: 0.4 ms
MDC IDC STAT BRADY AS VS PERCENT: 12 %

## 2016-11-06 NOTE — Progress Notes (Signed)
Patient Care Team: Malva LimesFisher, Donald E, MD as PCP - General (Family Medicine) Antonieta IbaGollan, Timothy J, MD as Consulting Physician (Cardiology)   HPI  Max Lozano is a 66 y.o. male Has hx of pacer implanted for symptomatic bradycardia and atrial fibrillaton CHADSVASC=2(HTN/DM) the latter recently diagnosed with a hemoglobin A1c of 6.5  The patient denies chest pain, shortness of breath, nocturnal dyspnea, orthopnea or peripheral edema.  There have been no palpitations, lightheadedness or syncope.   He can fall asleep when he sits down but he never sits down.  Device interrogation has demonstrated frequent paroxysms of atrial fibrillation; however, the patient is unaware of any variability in exercise performance and palpitations    Past Medical History:  Diagnosis Date  . Arrhythmia    Paroxysmal atrial fib   . Hyperlipidemia   . Hypertension   . Motion sickness    deep sea boats  . PPM-Medtronic 10/11/2009   Qualifier: Diagnosis of  By: Ladona Ridgelaylor, MD, Jerrell MylarFACC, Gregg William   . Sick sinus syndrome [facility](HCC)    s/p pacer  . Syncope and collapse   . Wears dentures    full upper and lower    Past Surgical History:  Procedure Laterality Date  . COLONOSCOPY WITH PROPOFOL N/A 03/25/2015   Procedure: COLONOSCOPY WITH PROPOFOL;  Surgeon: Midge Miniumarren Wohl, MD;  [facility];  Service: Endoscopy;  Laterality: N/A;  ULCER ASCENDING COLON BX.   [name]. INSERT / REPLACE / REMOVE PACEMAKER  09/04/07   Medtronic    Current Outpatient Prescriptions  Medication Sig Dispense Refill  . atorvastatin (LIPITOR) 10 MG tablet Take 1 tablet (10 mg total) by mouth daily. 90 tablet 3  . CARTIA XT 240 MG 24 hr capsule TAKE 1 CAPSULE(240 MG) BY MOUTH DAILY 30 capsule 0  . fish oil-omega-3 fatty acids 1000 MG capsule Take 1 g by mouth 2 (two) times daily.      Marland Kitchen. lisinopril-hydrochlorothiazide (PRINZIDE,ZESTORETIC) 20-12.5 MG tablet TAKE 1 TABLET BY MOUTH ONCE DAILY 90 tablet 3  . rivaroxaban (XARELTO)  20 MG TABS tablet Take 1 tablet (20 mg total) by mouth daily with supper. 30 tablet 11   No current facility-administered medications for this visit.     Allergies  Allergen Reactions  . Atenolol Rash    Review of Systems negative except from HPI and PMH  Physical Exam BP 124/86 (BP Location: Left Arm, Patient Position: Sitting, Cuff Size: Normal)   Pulse 88   Ht 5\' 10"  (1.778 m)   Wt 217 lb (98.4 kg)   BMI 31.14 kg/m  Well developed and nourished in no acute distress HENT normal Neck supple with JVP-flat Clear Regular rate and rhythm, no murmurs or gallops Abd-soft with active BS No Clubbing cyanosis edema Skin-warm and dry A & Oriented  Grossly normal sensory and motor function     Assessment and  Plan  Sinus node dysfunction  100% A paced with reasonable excursion and good exercise tolerance  Paroxysmal atrial fibrillation  Pacemaker-Medtronic The patient's device was interrogated.  The information was reviewed. No changes were made in the programming.     Hypertension  Diabetes  Sleep disordered breathing   Reasonable exercise tolerance and no variability to implicate the afib, although the rates are concerning with 50% of Afib HR >120  Will try and correlate A. fib with exercise tolerance. For right now we will not make any adjustments on his medications given the paucity of symptoms.  His blood pressure is  well-controlled  No bleeding issues  No symptoms to support hypersomnolence

## 2016-11-06 NOTE — Patient Instructions (Addendum)
Medication Instructions:   No medication changes made He developed symptoms from your atrial fibrillation, would call the office Recommend we talk with Dr. Graciela HusbandsKlein,    Labwork:  No new labs needed  Testing/Procedures:  No further testing at this time   Follow-Up: It was a pleasure seeing you in the office today. Please call us if you have new issues that need to be addressed before your next appt.  [phone]  Your physician wants you to follow-up in: 6 months.  You will receive a reminder letter in the mail two months in advance. If you don't receive a letter, please call our office to schedule the follow-up appointment.  If you need a refill on your cardiac medications before your next appointment, please call your pharmacy.

## 2016-11-06 NOTE — Patient Instructions (Signed)
Medication Instructions: - Your physician recommends that you continue on your current medications as directed. Please refer to the Current Medication list given to you today.  Labwork: - none ordered  Procedures/Testing: - none ordered  Follow-Up: - Your physician wants you to follow-up in: 4 months with Dr. Klein. You will receive a reminder letter in the mail two months in advance. If you don't receive a letter, please call our office to schedule the follow-up appointment.  Any Additional Special Instructions Will Be Listed Below (If Applicable).     If you need a refill on your cardiac medications before your next appointment, please call your pharmacy.   

## 2016-11-16 ENCOUNTER — Other Ambulatory Visit: Payer: Self-pay | Admitting: Cardiovascular Disease

## 2016-11-19 ENCOUNTER — Telehealth: Payer: Self-pay | Admitting: Internal Medicine

## 2016-11-19 ENCOUNTER — Other Ambulatory Visit: Payer: Self-pay

## 2016-11-19 MED ORDER — RIVAROXABAN 20 MG PO TABS: 20.0000 mg | ORAL_TABLET | Freq: Every day | ORAL | 3 refills | Status: DC

## 2016-11-19 MED ORDER — RIVAROXABAN 20 MG PO TABS: 20.0000 mg | ORAL_TABLET | Freq: Every day | ORAL | 11 refills | Status: DC

## 2016-11-19 NOTE — Telephone Encounter (Signed)
°*  STAT* If patient is at the pharmacy, call can be transferred to refill team.   1. Which medications need to be refilled? (please list name of each medication and dose if known)  xarelto   2. Which pharmacy/location (including street and city if local pharmacy) is medication to be sent to? [facility] in [place]   3. Do they need a 30 day or 90 day supply? 3 30 day

## 2016-11-19 NOTE — Telephone Encounter (Signed)
Requested Prescriptions   Signed Prescriptions Disp Refills  . rivaroxaban (XARELTO) 20 MG TABS tablet 30 tablet 11    Sig: Take 1 tablet (20 mg total) by mouth daily with supper.    Authorizing Provider: Duke SalviaKLEIN, STEVEN C    Ordering User: Margrett RudSLAYTON, Mong Neal N

## 2016-11-19 NOTE — Telephone Encounter (Signed)
Requested Prescriptions   Signed Prescriptions Disp Refills  . rivaroxaban (XARELTO) 20 MG TABS tablet 30 tablet 11    Sig: Take 1 tablet (20 mg total) by mouth daily with supper.    Authorizing Provider: KLEIN, STEVEN C    Ordering User: Keiji Melland N    

## 2016-11-23 ENCOUNTER — Other Ambulatory Visit: Payer: Self-pay

## 2016-11-23 NOTE — Telephone Encounter (Signed)
Pt is completely out

## 2016-11-26 ENCOUNTER — Other Ambulatory Visit: Payer: Self-pay | Admitting: *Deleted

## 2016-11-26 MED ORDER — RIVAROXABAN 20 MG PO TABS: 20.0000 mg | ORAL_TABLET | Freq: Every day | ORAL | 3 refills | Status: DC

## 2016-11-26 NOTE — Telephone Encounter (Signed)
Requested Prescriptions   Signed Prescriptions Disp Refills  . rivaroxaban (XARELTO) 20 MG TABS tablet 30 tablet 3    Sig: Take 1 tablet (20 mg total) by mouth daily with supper.    Authorizing Provider: Duke SalviaKLEIN, STEVEN C    Ordering User: Kendrick FriesLOPEZ, Maylynn Orzechowski C

## 2016-11-26 NOTE — Telephone Encounter (Signed)
Requested Prescriptions   Signed Prescriptions Disp Refills  . rivaroxaban (XARELTO) 20 MG TABS tablet 30 tablet 3    Sig: Take 1 tablet (20 mg total) by mouth daily with supper.    Authorizing Provider: KLEIN, STEVEN C    Ordering User: Elloise Roark C    

## 2016-12-17 ENCOUNTER — Other Ambulatory Visit: Payer: Self-pay | Admitting: Cardiovascular Disease

## 2017-02-18 ENCOUNTER — Other Ambulatory Visit: Payer: Self-pay | Admitting: Cardiovascular Disease

## 2017-03-22 ENCOUNTER — Other Ambulatory Visit: Payer: Self-pay | Admitting: Cardiovascular Disease

## 2017-03-26 ENCOUNTER — Ambulatory Visit (INDEPENDENT_AMBULATORY_CARE_PROVIDER_SITE_OTHER): Payer: 59 | Admitting: Internal Medicine

## 2017-03-26 ENCOUNTER — Encounter: Payer: Self-pay | Admitting: Internal Medicine

## 2017-03-26 VITALS — BP 122/80 | HR 65 | Ht 70.0 in | Wt 216.0 lb

## 2017-03-26 DIAGNOSIS — I495 Sick sinus syndrome: Secondary | ICD-10-CM

## 2017-03-26 DIAGNOSIS — I480 Paroxysmal atrial fibrillation: Secondary | ICD-10-CM | POA: Diagnosis not present

## 2017-03-26 DIAGNOSIS — Z950 Presence of cardiac pacemaker: Secondary | ICD-10-CM | POA: Diagnosis not present

## 2017-03-26 LAB — CUP PACEART INCLINIC DEVICE CHECK
Battery Impedance: 1421 Ohm
Brady Statistic AP VP Percent: 1 %
Brady Statistic AP VS Percent: 85 %
Brady Statistic AS VS Percent: 14 %
Date Time Interrogation Session: 20181120161115
Implantable Lead Location: 753859
Implantable Lead Model: 5076
Lead Channel Impedance Value: 499 Ohm
Lead Channel Pacing Threshold Amplitude: 0.5 V
Lead Channel Pacing Threshold Amplitude: 0.75 V
Lead Channel Sensing Intrinsic Amplitude: 11.2 mV
Lead Channel Sensing Intrinsic Amplitude: 2.8 mV
Lead Channel Setting Pacing Pulse Width: 0.4 ms
Lead Channel Setting Sensing Sensitivity: 4 mV
MDC IDC LEAD IMPLANT DT: 20090430
MDC IDC LEAD IMPLANT DT: 20090430
MDC IDC LEAD LOCATION: 753860
MDC IDC MSMT BATTERY REMAINING LONGEVITY: 48 mo
MDC IDC MSMT BATTERY VOLTAGE: 2.77 V
MDC IDC MSMT LEADCHNL RA PACING THRESHOLD PULSEWIDTH: 0.4 ms
MDC IDC MSMT LEADCHNL RV IMPEDANCE VALUE: 487 Ohm
MDC IDC MSMT LEADCHNL RV PACING THRESHOLD PULSEWIDTH: 0.4 ms
MDC IDC PG IMPLANT DT: 20090430
MDC IDC SET LEADCHNL RA PACING AMPLITUDE: 2 V
MDC IDC SET LEADCHNL RV PACING AMPLITUDE: 2.5 V
MDC IDC STAT BRADY AS VP PERCENT: 0 %

## 2017-03-26 MED ORDER — BISOPROLOL FUMARATE 5 MG PO TABS: 5.0000 mg | ORAL_TABLET | Freq: Every day | ORAL | 5 refills | Status: DC

## 2017-03-26 MED ORDER — PROPRANOLOL HCL ER 80 MG PO CP24: 80.0000 mg | ORAL_CAPSULE | Freq: Every day | ORAL | 5 refills | Status: DC

## 2017-03-26 MED ORDER — METOPROLOL SUCCINATE ER 50 MG PO TB24: 50.0000 mg | ORAL_TABLET | Freq: Every day | ORAL | 5 refills | Status: DC

## 2017-03-26 NOTE — Patient Instructions (Signed)
Medication Instructions:  Your physician has recommended you make the following change in your medication:  STOP taking lisinopril-HCTZ You have been given prescriptions for : Metoprolol 50mg  once daily Propranolol 80mg  once daily Bisoprolol 5mg  once daily Please fill ONE prescription and take it. If symptoms improve and you would like to continue that medication, discard the other prescriptions. You should only try one medication at a time. Please do not take all three at the same time.    Labwork: BMET and CBC today  Testing/Procedures: Remote monitoring is used to monitor your Pacemaker of ICD from home. This monitoring reduces the number of office visits required to check your device to one time per year. It allows us to keep an eye on the functioning of your device to ensure it is working properly. You are scheduled for a device check from home on February 19. You may send your transmission at any time that day. If you have a wireless device, the transmission will be sent automatically. After your physician reviews your transmission, you will receive a postcard with your next transmission date.    Follow-Up: Your physician wants you to follow-up in:1 year with Dr. Graciela HusbandsKlein.You will receive a reminder letter in the mail two months in advance. If you don't receive a letter, please call our office to schedule the follow-up appointment.   Any Other Special Instructions Will Be Listed Below (If Applicable).     If you need a refill on your cardiac medications before your next appointment, please call your pharmacy.

## 2017-03-26 NOTE — Progress Notes (Signed)
Patient Care Team: Malva LimesFisher, Donald E, MD as PCP - General (Family Medicine) Antonieta IbaGollan, Timothy J, MD as Consulting Physician (Cardiology)   HPI  Max Lozano L Fulghum is a 66 y.o. male Has hx of pacer implanted for symptomatic bradycardia and atrial fibrillaton CHADSVASC=2(HTN/DM) the latter recently diagnosed with a hemoglobin A1c of 6.5  The patient denies chest pain, shortness of breath, nocturnal dyspnea, orthopnea or peripheral edema.  There have been no   lightheadedness or syncope.  He has scant palpitations  He snores.  He falls asleep watching TV.   Past Medical History:  Diagnosis Date  . Arrhythmia    Paroxysmal atrial fib   . Hyperlipidemia   . Hypertension   . Motion sickness    deep sea boats  . PPM-Medtronic 10/11/2009   Qualifier: Diagnosis of  By: Ladona Ridgelaylor, MD, Jerrell MylarFACC, Gregg William   . Sick sinus syndrome [facility](HCC)    s/p pacer  . Syncope and collapse   . Wears dentures    full upper and lower    Past Surgical History:  Procedure Laterality Date  . COLONOSCOPY WITH PROPOFOL N/A 03/25/2015   [facility] Midge MiniumWohl, Darren, [facility]  . INSERT / REPLACE / REMOVE PACEMAKER  09/04/07   Medtronic    Current Outpatient Medications  Medication Sig Dispense Refill  . atorvastatin (LIPITOR) 10 MG tablet TAKE 1 TABLET(10 MG) BY MOUTH DAILY 90 tablet 3  . CARTIA XT 240 MG 24 hr capsule TAKE 1 CAPSULE(240 MG) BY MOUTH DAILY 30 capsule 3  . fish oil-omega-3 fatty acids 1000 MG capsule Take 1 g by mouth 2 (two) times daily.      Marland Kitchen. lisinopril-hydrochlorothiazide (PRINZIDE,ZESTORETIC) 20-12.5 MG tablet TAKE 1 TABLET BY MOUTH ONCE DAILY 90 tablet 0  . rivaroxaban (XARELTO) 20 MG TABS tablet Take 1 tablet (20 mg total) by mouth daily with supper. 30 tablet 3   No current facility-administered medications for this visit.     Allergies  Allergen Reactions  . Atenolol Rash    Review of Systems negative except from HPI and PMH  Physical Exam BP 122/80 (BP  Location: Left Arm, Patient Position: Sitting, Cuff Size: Normal)   Pulse 65   Ht 5\' 10"  (1.778 m)   Wt 216 lb (98 kg)   BMI 30.99 kg/m  Well developed and nourished in no acute distress HENT normal Neck supple with JVP-flat Clear Regular rate and rhythm, no murmurs or gallops Abd-soft with active BS No Clubbing cyanosis edema Skin-warm and dry A & Oriented  Grossly normal sensory and motor function   ECG atrially paced at 65 Intervals 19/09/37 Left ventricular hypertrophy  Assessment and  Plan  Sinus node dysfunction  100% A paced with reasonable excursion and good exercise tolerance  Paroxysmal atrial fibrillation burden 15.7%  Pacemaker-Medtronic The patient's device was interrogated.  The information was reviewed. No changes were made in the programming.     Hypertension  Diabetes  Sleep disordered breathing   I will encouraged him again to have a sleep study.  He is interested in considering catheter ablation with the incremental burden of atrial fibrillation.  I think this is reasonable but excluding sleep apnea is essential and  treating it if present  On Anticoagulation;  No bleeding issues   BP well controlled  The atrial fibrillation burden as noted above is increasing.  We will discontinue his lisinopril and start him on a beta-blocker.  I have given him prescriptions metoprolol succinate  50, Inderal LA 80, bisoprolol 5.  We will see if he can tolerate any of them and see if we can control his atrial fibrillation rate.

## 2017-03-27 LAB — CBC
Hematocrit: 43.6 % (ref 37.5–51.0)
Hemoglobin: 14.8 g/dL (ref 13.0–17.7)
MCH: 31 pg (ref 26.6–33.0)
MCHC: 33.9 g/dL (ref 31.5–35.7)
MCV: 91 fL (ref 79–97)
PLATELETS: 130 10*3/uL — AB (ref 150–379)
RBC: 4.77 x10E6/uL (ref 4.14–5.80)
RDW: 15 % (ref 12.3–15.4)
WBC: 9.5 10*3/uL (ref 3.4–10.8)

## 2017-03-27 LAB — BASIC METABOLIC PANEL
BUN/Creatinine Ratio: 9 — ABNORMAL LOW (ref 10–24)
BUN: 9 mg/dL (ref 8–27)
CALCIUM: 9.2 mg/dL (ref 8.6–10.2)
CHLORIDE: 105 mmol/L (ref 96–106)
CO2: 19 mmol/L — AB (ref 20–29)
Creatinine, Ser: 0.98 mg/dL (ref 0.76–1.27)
GFR calc non Af Amer: 81 mL/min/{1.73_m2} (ref >59)
GFR, EST AFRICAN AMERICAN: 94 mL/min/{1.73_m2} (ref >59)
GLUCOSE: 113 mg/dL — AB (ref 65–99)
POTASSIUM: 4.1 mmol/L (ref 3.5–5.2)
Sodium: 140 mmol/L (ref 134–144)

## 2017-04-16 ENCOUNTER — Other Ambulatory Visit: Payer: Self-pay | Admitting: Internal Medicine

## 2017-04-23 ENCOUNTER — Other Ambulatory Visit: Payer: Self-pay | Admitting: Cardiovascular Disease

## 2017-05-06 ENCOUNTER — Encounter: Payer: Self-pay | Admitting: Family Medicine

## 2017-05-06 ENCOUNTER — Ambulatory Visit: Payer: 59 | Admitting: Family Medicine

## 2017-05-06 VITALS — BP 140/84 | HR 76 | Temp 98.0°F | Resp 16 | Ht 70.0 in | Wt 221.0 lb

## 2017-05-06 DIAGNOSIS — I480 Paroxysmal atrial fibrillation: Secondary | ICD-10-CM

## 2017-05-06 DIAGNOSIS — R400 Somnolence: Secondary | ICD-10-CM | POA: Diagnosis not present

## 2017-05-06 NOTE — Progress Notes (Addendum)
       Patient: Max Lozano L Leisure Male    DOB: 07/30/1950   66 y.o.   MRN: [id number] Visit Date: 05/06/2017  Today's Provider: Mila Merryonald Fisher, MD   No chief complaint on file.  Subjective:    HPI  Patient is here to discuss having a sleep study done. Patient states his cardiologist recommended the study due to patient having A-FIBDr. Graciela HusbandsKlein is considering cather ablation for atrial fibrillation, but requires him to be evaluated for OSA before proceeding. He states he does not feel unusually tired, but does fall asleep easily after getting home from work in the afternoon. Allergies  Allergen Reactions  . Atenolol Rash     Current Outpatient Medications:  .  atorvastatin (LIPITOR) 10 MG tablet, TAKE 1 TABLET(10 MG) BY MOUTH DAILY, Disp: 90 tablet, Rfl: 3 .  bisoprolol (ZEBETA) 5 MG tablet, Take 1 tablet (5 mg total) by mouth daily., Disp: 30 tablet, Rfl: 5 .  CARTIA XT 240 MG 24 hr capsule, TAKE 1 CAPSULE(240 MG) BY MOUTH DAILY, Disp: 30 capsule, Rfl: 0 .  fish oil-omega-3 fatty acids 1000 MG capsule, Take 1 g by mouth 2 (two) times daily.  , Disp: , Rfl:  .  propranolol ER (INDERAL LA) 80 MG 24 hr capsule, Take 1 capsule (80 mg total) by mouth daily., Disp: 30 capsule, Rfl: 5 .  XARELTO 20 MG TABS tablet, TAKE 1 TABLET BY MOUTH ONCE DAILY WITH SUPPER, Disp: 30 tablet, Rfl: 6  Review of Systems  Constitutional: Negative for appetite change, chills and fever.  Respiratory: Negative for chest tightness, shortness of breath and wheezing.   Cardiovascular: Negative for chest pain and palpitations.  Gastrointestinal: Negative for abdominal pain, nausea and vomiting.    Social History   Tobacco Use  . Smoking status: Never Smoker  . Smokeless tobacco: Never Used  Substance Use Topics  . Alcohol use: No   Objective:   BP 140/84 (BP Location: Left Arm, Patient Position: Sitting, Cuff Size: Large)   Pulse 76   Temp 98 F (36.7 C) (Oral)   Resp 16   Ht 5\' 10"  (1.778 m)   Wt 221 lb  (100.2 kg)   SpO2 98%   BMI 31.71 kg/m  Vitals:   05/06/17 1114  BP: 140/84  Pulse: 76  Resp: 16  Temp: 98 F (36.7 C)  TempSrc: Oral  SpO2: 98%  Weight: 221 lb (100.2 kg)  Height: 5\' 10"  (1.778 m)     Physical Exam  General Appearance:    Alert, cooperative, no distress, obese  Eyes:    PERRL, conjunctiva/corneas clear, EOM's intact       Lungs:     Clear to auscultation bilaterally, respirations unlabored  Heart:    Regular rate and rhythm  Neurologic:   Awake, alert, oriented x 3. No apparent focal neurological           defect.       epworth=10    Assessment & Plan:     1. Paroxysmal atrial fibrillation (HCC)  - Nocturnal polysomnography (NPSG); Future  2. Sleepiness  - Nocturnal polysomnography (NPSG); Future   He was offered flu vaccine today which he declined.       Mila Merryonald Fisher, MD  [facility]

## 2017-05-06 NOTE — Addendum Note (Signed)
Addended by: Mila MerryFISHER, DONALD E on: 05/06/2017 11:43 AM   Modules accepted: Level of Service

## 2017-05-16 ENCOUNTER — Telehealth: Payer: Self-pay | Admitting: Family Medicine

## 2017-05-16 NOTE — Telephone Encounter (Signed)
Order for baseline PSG faxed to [facility] °

## 2017-05-25 ENCOUNTER — Other Ambulatory Visit: Payer: Self-pay | Admitting: Cardiovascular Disease

## 2017-05-28 ENCOUNTER — Telehealth: Payer: Self-pay

## 2017-05-28 NOTE — Telephone Encounter (Signed)
l mom to schedule f/u appt 

## 2017-05-28 NOTE — Telephone Encounter (Signed)
-----   Message from Festus AloeSharon G Crespo, CMA sent at 05/27/2017  9:01 AM EST ----- Please contact patient for a follow up appointment.  The patient was to be seen in 04/2017. Thanks, Jasmine DecemberSharon

## 2017-05-31 ENCOUNTER — Telehealth: Payer: Self-pay | Admitting: Internal Medicine

## 2017-05-31 ENCOUNTER — Telehealth: Payer: Self-pay

## 2017-05-31 NOTE — Telephone Encounter (Signed)
Done

## 2017-05-31 NOTE — Telephone Encounter (Signed)
Patient has decided to take propranolol ER 80 mg q day and was told to fu with nurse to let us know what he decided.

## 2017-05-31 NOTE — Telephone Encounter (Signed)
l mom for pt to call and schedule f/u appt . Past due

## 2017-06-03 NOTE — Telephone Encounter (Signed)
No answer. Left message to call back.   

## 2017-06-04 NOTE — Telephone Encounter (Signed)
Per 03/26/17 OV notes: "The atrial fibrillation burden as noted above is increasing.  We will discontinue his lisinopril and start him on a beta-blocker.  I have given him prescriptions metoprolol succinate 50, Inderal LA 80, bisoprolol 5.  We will see if he can tolerate any of them and see if we can control his atrial fibrillation rate.  Left message on pt identified home/cell VM that we received information regarding propranolol ER. Medication was already on medication list. Nothing to update.

## 2017-06-13 ENCOUNTER — Telehealth: Payer: Self-pay | Admitting: Internal Medicine

## 2017-06-13 MED ORDER — RIVAROXABAN 20 MG PO TABS: ORAL_TABLET | ORAL | 0 refills | Status: DC

## 2017-06-13 NOTE — Telephone Encounter (Signed)
Pt calling stating he is going on Medicare in march and is needing us to please see if we can give him an alternative on the Xarelto  He states it will be $400 a month and is asking if there is a generic on this or if he can take something else.  Please advise.

## 2017-06-13 NOTE — Telephone Encounter (Signed)
S/w patient. [facility] and Xarelto is going to cost him more than he can afford. Advised patient to see if Eliquis would be more affordable and explained coumadin as well. Patient is due to refill Xarelto prior to the insurance change. I sent in a 90-day Rx for Xarelto so patient can refill prior to switching to Medicare and get him to appointment with Dr Mariah MillingGollan on 07/15/17 where he can discuss treatment options further.  Let him know we could also fill out patient assistant application as well. He was agreeable to plan to get refill and discuss with Dr Mariah MillingGollan in March.

## 2017-06-25 ENCOUNTER — Ambulatory Visit (INDEPENDENT_AMBULATORY_CARE_PROVIDER_SITE_OTHER): Payer: 59 | Admitting: *Deleted

## 2017-06-25 ENCOUNTER — Telehealth: Payer: Self-pay | Admitting: Cardiology

## 2017-06-25 DIAGNOSIS — I495 Sick sinus syndrome: Secondary | ICD-10-CM | POA: Diagnosis not present

## 2017-06-25 NOTE — Telephone Encounter (Signed)
LMOVM reminding pt to send remote transmission.   

## 2017-06-26 LAB — CUP PACEART REMOTE DEVICE CHECK
Battery Impedance: 1506 Ohm
Battery Voltage: 2.76 V
Brady Statistic AP VP Percent: 0 %
Brady Statistic AP VS Percent: 98 %
Brady Statistic AS VP Percent: 0 %
Brady Statistic AS VS Percent: 2 %
Date Time Interrogation Session: 20190219221144
Implantable Lead Implant Date: 20090430
Implantable Lead Location: 753860
Implantable Lead Model: 5076
Implantable Lead Model: 5076
Lead Channel Impedance Value: 487 Ohm
Lead Channel Impedance Value: 501 Ohm
Lead Channel Pacing Threshold Amplitude: 0.625 V
Lead Channel Pacing Threshold Pulse Width: 0.4 ms
Lead Channel Setting Pacing Amplitude: 2.5 V
Lead Channel Setting Pacing Pulse Width: 0.4 ms
MDC IDC LEAD IMPLANT DT: 20090430
MDC IDC LEAD LOCATION: 753859
MDC IDC MSMT BATTERY REMAINING LONGEVITY: 46 mo
MDC IDC MSMT LEADCHNL RV PACING THRESHOLD AMPLITUDE: 0.75 V
MDC IDC MSMT LEADCHNL RV PACING THRESHOLD PULSEWIDTH: 0.4 ms
MDC IDC PG IMPLANT DT: 20090430
MDC IDC SET LEADCHNL RA PACING AMPLITUDE: 2 V
MDC IDC SET LEADCHNL RV SENSING SENSITIVITY: 4 mV

## 2017-06-26 NOTE — Progress Notes (Signed)
Remote pacemaker transmission.   

## 2017-06-27 ENCOUNTER — Encounter: Payer: Self-pay | Admitting: Cardiology

## 2017-06-28 ENCOUNTER — Other Ambulatory Visit: Payer: Self-pay | Admitting: Cardiovascular Disease

## 2017-07-13 NOTE — Progress Notes (Signed)
Patient ID: Max Lozano, male   DOB: [birth year], 67 y.o.   MRN: [id number] Cardiology Office Note  Date:  07/15/2017   ID:  Max Lozano, DOB [birth year], MRN [id number]  PCP:  Max Limes, MD   Chief Complaint  Patient presents with  . other    6 month follow up. Meds reviewed by the pt. verbally. "doing well."     HPI:  Max Lozano is a 67 year old male with a history of  hypertension,  Paroxysmal atrial fibrillation (Relatively asymptomatic), tachy-brady syndrome  s/p pacemaker  ETT 08/2007 with excellent exercise capacity no evidence of ischemia  ECHO 2009: EF 55% Carotid ultrasound 2014 with minimal plaque who presents for routine followup of his atrial fibrillation.  Sleep study ordered by primary care Overall feels well, denies any tachycardia concerning for atrial fibrillation  Download of monitor in the past month shows 5% atrial tachycardia/fibrillation events Atrial fibrillation burden has decreased on propranolol with diltiazem previously 14% at the end of 2017  No regular exercise program Labs in 03/2017: WNL  EKG personally reviewed by myself on todays visit Shows normal sinus rhythm rate 63 bpm no significant ST or T wave changes  Other past medical history Pacer download 07/2016: 14% atrial fib (up from 11% in 04/216) was 2.2 % in 10/2015  HBa1C 6.5 togtal chol 157, LDL 78  Records reviewed showing mild carotid arterial disease bilaterally (bruit on exam out of proportion to his disease from 2014, suspect tortuous vessel)  Hospitalized August 16 2014 with discharge 08/18/2014 with upper respiratory infection Acute onset of shortness of breath, treated without biotics  significant family history of coronary artery disease with father who had MI in his late 48s, he was a smoker, mother with congestive heart failure in her 54s.   PMH:   has a past medical history of Motion sickness, PPM-Medtronic (10/11/2009), Sick sinus syndrome (HCC), Syncope and collapse,  and Wears dentures.  PSH:    Past Surgical History:  Procedure Laterality Date  . COLONOSCOPY WITH PROPOFOL N/A 03/25/2015   Procedure: COLONOSCOPY WITH PROPOFOL;  Surgeon: Max Minium, MD;  Location: [facility];  Service: Endoscopy;  Laterality: N/A;  ULCER ASCENDING COLON BX.   . INSERT / REPLACE / REMOVE PACEMAKER  09/04/07   Medtronic    Current Outpatient Medications  Medication Sig Dispense Refill  . atorvastatin (LIPITOR) 10 MG tablet TAKE 1 TABLET(10 MG) BY MOUTH DAILY 90 tablet 3  . CARTIA XT 240 MG 24 hr capsule TAKE ONE CAPSULE BY MOUTH DAILY 30 capsule 0  . fish oil-omega-3 fatty acids 1000 MG capsule Take 1 g by mouth 2 (two) times daily.      . propranolol ER (INDERAL LA) 80 MG 24 hr capsule Take 1 capsule (80 mg total) by mouth daily. 30 capsule 5  . rivaroxaban (XARELTO) 20 MG TABS tablet TAKE 1 TABLET BY MOUTH ONCE DAILY WITH SUPPER 90 tablet 0   No current facility-administered medications for this visit.      Allergies:   Atenolol   Social History:  The patient  reports that  has never smoked. he has never used smokeless tobacco. He reports that he does not drink alcohol or use drugs.   Family History:   family history includes Diabetes in his mother; Heart attack in his father.   Review of Systems: Review of Systems  Constitutional: Negative.   Respiratory: Negative.   Cardiovascular: Negative.   Gastrointestinal: Negative.   Musculoskeletal: Negative.  Neurological: Negative.   Psychiatric/Behavioral: Negative.   All other systems reviewed and are negative.   PHYSICAL EXAM: VS:  BP 130/80 (BP Location: Left Arm, Patient Position: Sitting, Cuff Size: Normal)   Pulse 63   Ht 5\' 10"  (1.778 m)   Wt 212 lb 8 oz (96.4 kg)   BMI 30.49 kg/m  , BMI Body mass index is 30.49 kg/m. GEN: Well nourished, well developed, in no acute distress  HEENT: normal  Neck: no JVD,  + b/l carotid bruits 2+, no masses CardiacIrregularly irregular,no murmurs,  rubs, or gallops,no edema  Respiratory:  clear to auscultation bilaterally, normal work of breathing GI: soft, nontender, nondistended, + BS MS: no deformity or atrophy  Skin: warm and dry, no rash Neuro:  Strength and sensation are intact Psych: euthymic mood, full affect   Recent Labs: 03/26/2017: BUN 9; Creatinine, Ser 0.98; Hemoglobin 14.8; Platelets 130; Potassium 4.1; Sodium 140   Labs will be drawn in the office today  Lipid Panel Lab Results  Component Value Date   CHOL 157 10/06/2015   HDL 37 (L) 10/06/2015   LDLCALC 78 10/06/2015   TRIG 211 (H) 10/06/2015    Labs will be drawn in the office today  Wt Readings from Last 3 Encounters:  07/15/17 212 lb 8 oz (96.4 kg)  05/06/17 221 lb (100.2 kg)  03/26/17 216 lb (98 kg)       ASSESSMENT AND PLAN:  Hyperlipidemia - No recent lipid panel available Previously close to goal No changes made to his Lipitor  Elevated glucose -  Hemoglobin A1c 6.5 in 2017  Encouraged low carbohydrate diet  Paroxysmal atrial fibrillation (HCC) Lower atrial fibrillation burden on current medication regiment  reports feeling well on beta-blocker and calcium channel blocker/diltiazem Pacer download reviewed with him in detail  Carotid stenosis  ultrasound in 2014 showing mild bilateral carotid disease Continue aggressive lipid management stable  PPM-Medtronic Followed by Max Lozano  Sinoatrial node dysfunction [facility](HCC) Pacemaker, no symptoms Stable  HYPERTENSION, BENIGN ESSENTIAL Blood pressure is well controlled on today's visit. No changes made to the medications.   Disposition:   F/U  12 months   Total encounter time more than 25 minutes  Greater than 50% was spent in counseling and coordination of care with the patient    Orders Placed This Encounter  Procedures  . EKG 12-Lead   Signed, Max Lozano, M.D., Ph.D. 07/15/2017  [facility], [place] [phone]

## 2017-07-15 ENCOUNTER — Ambulatory Visit (INDEPENDENT_AMBULATORY_CARE_PROVIDER_SITE_OTHER): Payer: Medicare Other | Admitting: Cardiovascular Disease

## 2017-07-15 ENCOUNTER — Encounter: Payer: Self-pay | Admitting: Cardiovascular Disease

## 2017-07-15 VITALS — BP 130/80 | HR 63 | Ht 70.0 in | Wt 212.5 lb

## 2017-07-15 DIAGNOSIS — I480 Paroxysmal atrial fibrillation: Secondary | ICD-10-CM

## 2017-07-15 DIAGNOSIS — I10 Essential (primary) hypertension: Secondary | ICD-10-CM | POA: Diagnosis not present

## 2017-07-15 DIAGNOSIS — Z950 Presence of cardiac pacemaker: Secondary | ICD-10-CM

## 2017-07-15 DIAGNOSIS — E782 Mixed hyperlipidemia: Secondary | ICD-10-CM | POA: Diagnosis not present

## 2017-07-15 DIAGNOSIS — I495 Sick sinus syndrome: Secondary | ICD-10-CM

## 2017-07-15 MED ORDER — PROPRANOLOL HCL ER 80 MG PO CP24: 80.0000 mg | ORAL_CAPSULE | Freq: Every day | ORAL | 3 refills | Status: DC

## 2017-07-15 MED ORDER — DILTIAZEM HCL ER COATED BEADS 240 MG PO CP24: 240.0000 mg | ORAL_CAPSULE | Freq: Every day | ORAL | 3 refills | Status: DC

## 2017-07-15 MED ORDER — ATORVASTATIN CALCIUM 10 MG PO TABS: ORAL_TABLET | ORAL | 3 refills | Status: DC

## 2017-07-15 MED ORDER — RIVAROXABAN 20 MG PO TABS: ORAL_TABLET | ORAL | 11 refills | Status: DC

## 2017-07-15 NOTE — Patient Instructions (Signed)

## 2017-07-17 ENCOUNTER — Other Ambulatory Visit: Payer: Self-pay | Admitting: Cardiovascular Disease

## 2017-07-17 ENCOUNTER — Ambulatory Visit: Payer: Medicare Other | Attending: Otolaryngology

## 2017-07-17 DIAGNOSIS — I493 Ventricular premature depolarization: Secondary | ICD-10-CM | POA: Insufficient documentation

## 2017-07-17 DIAGNOSIS — I10 Essential (primary) hypertension: Secondary | ICD-10-CM | POA: Insufficient documentation

## 2017-07-17 DIAGNOSIS — G4733 Obstructive sleep apnea (adult) (pediatric): Secondary | ICD-10-CM | POA: Diagnosis not present

## 2017-07-17 DIAGNOSIS — R0683 Snoring: Secondary | ICD-10-CM | POA: Insufficient documentation

## 2017-07-17 DIAGNOSIS — I4891 Unspecified atrial fibrillation: Secondary | ICD-10-CM | POA: Insufficient documentation

## 2017-07-22 ENCOUNTER — Encounter: Payer: Self-pay | Admitting: Family Medicine

## 2017-07-22 DIAGNOSIS — G4733 Obstructive sleep apnea (adult) (pediatric): Secondary | ICD-10-CM | POA: Insufficient documentation

## 2017-07-25 ENCOUNTER — Telehealth: Payer: Self-pay | Admitting: Cardiovascular Disease

## 2017-07-25 NOTE — Telephone Encounter (Signed)
Spoke with patient and he states that when he went to get his Xarelto it was $305.00 for one month supply. He states that he is unable to afford this and would like to know if there is another recommendation. Advised that I would have to check with Dr. Mariah MillingGollan and then I would give him a call back. Leaving samples up front for him to pick up so that he does not go without until we determine a plan. He was appreciative for the call back and requested that once we hear back to please call him after 4:00 PM. Will route to Dr. Mariah MillingGollan for review.

## 2017-07-25 NOTE — Telephone Encounter (Signed)
Pt states his pharmacist would not accept the coupons for Xarelto, he would like to discuss an alternative.

## 2017-07-26 NOTE — Telephone Encounter (Signed)
Patient reports xarelto $305 this month as he turned 67 March 4 and went on Medicare. Reviewed Medicare out of pocket medication expense that he would need to meet before his xarelto cost would lower. I asked him to follow up with insurance company to confirm benefits.  Patient does not want to consider coumadin at this time.  He will refill xarelto and speak with insurance company to determine his out of pocket cost after he reaches $405. Pt appreciative of the information,

## 2017-07-26 NOTE — Telephone Encounter (Signed)
Pt returning our call  ° °

## 2017-07-26 NOTE — Telephone Encounter (Signed)
No answer. Left message to call back.   

## 2017-07-26 NOTE — Telephone Encounter (Signed)
1) Would call eliquis help line 2) Patient assistance eliquis or xarelto 3) if no other options, change to warfarin We need a process when patients call.  Explain deductible

## 2017-09-24 ENCOUNTER — Ambulatory Visit (INDEPENDENT_AMBULATORY_CARE_PROVIDER_SITE_OTHER): Payer: Medicare Other | Admitting: *Deleted

## 2017-09-24 ENCOUNTER — Telehealth: Payer: Self-pay | Admitting: Cardiology

## 2017-09-24 DIAGNOSIS — I495 Sick sinus syndrome: Secondary | ICD-10-CM | POA: Diagnosis not present

## 2017-09-24 NOTE — Telephone Encounter (Signed)
LMOVM reminding pt to send remote transmission.   

## 2017-09-25 NOTE — Progress Notes (Signed)
Remote pacemaker transmission.   

## 2017-09-26 ENCOUNTER — Encounter: Payer: Self-pay | Admitting: Cardiology

## 2017-10-01 ENCOUNTER — Telehealth: Payer: Self-pay | Admitting: Family Medicine

## 2017-10-01 DIAGNOSIS — G4733 Obstructive sleep apnea (adult) (pediatric): Secondary | ICD-10-CM

## 2017-10-01 NOTE — Telephone Encounter (Signed)
Had a sleep study in March but has not heard anything back.  Pt's call back is [phone]  thanks teri

## 2017-10-01 NOTE — Telephone Encounter (Signed)
Please advise 

## 2017-10-01 NOTE — Telephone Encounter (Signed)
Please advise sleep test shows obstructive sleep apnea. I signed order CPAP titration, but order must have been misplaced, have sent new order to Sarah to schedule. He should get a call from her within the next week.

## 2017-10-02 NOTE — Telephone Encounter (Signed)
Patient was notified of results. Expressed understanding.  

## 2017-10-04 LAB — CUP PACEART REMOTE DEVICE CHECK
Battery Impedance: 1617 Ohm
Battery Voltage: 2.76 V
Brady Statistic AP VP Percent: 0 %
Brady Statistic AP VS Percent: 98 %
Brady Statistic AS VP Percent: 0 %
Implantable Lead Implant Date: 20090430
Implantable Lead Model: 5076
Implantable Lead Model: 5076
Lead Channel Impedance Value: 501 Ohm
Lead Channel Pacing Threshold Amplitude: 0.75 V
Lead Channel Pacing Threshold Pulse Width: 0.4 ms
Lead Channel Setting Pacing Amplitude: 2.5 V
Lead Channel Setting Pacing Pulse Width: 0.4 ms
MDC IDC LEAD IMPLANT DT: 20090430
MDC IDC LEAD LOCATION: 753859
MDC IDC LEAD LOCATION: 753860
MDC IDC MSMT BATTERY REMAINING LONGEVITY: 43 mo
MDC IDC MSMT LEADCHNL RV IMPEDANCE VALUE: 499 Ohm
MDC IDC MSMT LEADCHNL RV PACING THRESHOLD AMPLITUDE: 0.625 V
MDC IDC MSMT LEADCHNL RV PACING THRESHOLD PULSEWIDTH: 0.4 ms
MDC IDC PG IMPLANT DT: 20090430
MDC IDC SESS DTM: 20190521203632
MDC IDC SET LEADCHNL RA PACING AMPLITUDE: 2 V
MDC IDC SET LEADCHNL RV SENSING SENSITIVITY: 4 mV
MDC IDC STAT BRADY AS VS PERCENT: 2 %

## 2017-10-08 ENCOUNTER — Telehealth: Payer: Self-pay | Admitting: Family Medicine

## 2017-10-08 NOTE — Telephone Encounter (Signed)
Order for CPAP titration study faxed to [facility] °

## 2017-10-09 ENCOUNTER — Other Ambulatory Visit: Payer: Self-pay | Admitting: *Deleted

## 2017-10-09 MED ORDER — PROPRANOLOL HCL ER 80 MG PO CP24: 80.0000 mg | ORAL_CAPSULE | Freq: Every day | ORAL | 3 refills | Status: DC

## 2017-10-17 ENCOUNTER — Ambulatory Visit: Payer: Medicare Other | Attending: Otolaryngology

## 2017-10-17 DIAGNOSIS — G4733 Obstructive sleep apnea (adult) (pediatric): Secondary | ICD-10-CM | POA: Diagnosis not present

## 2017-11-04 ENCOUNTER — Other Ambulatory Visit: Payer: Self-pay | Admitting: Cardiovascular Disease

## 2017-11-04 NOTE — Telephone Encounter (Signed)
Refill Request.  

## 2017-12-24 ENCOUNTER — Ambulatory Visit (INDEPENDENT_AMBULATORY_CARE_PROVIDER_SITE_OTHER): Payer: Medicare Other | Admitting: *Deleted

## 2017-12-24 ENCOUNTER — Telehealth: Payer: Self-pay | Admitting: Cardiology

## 2017-12-24 DIAGNOSIS — I495 Sick sinus syndrome: Secondary | ICD-10-CM | POA: Diagnosis not present

## 2017-12-24 NOTE — Telephone Encounter (Signed)
LMOVM reminding pt to send remote transmission.   

## 2017-12-25 NOTE — Progress Notes (Signed)
Remote pacemaker transmission.   

## 2018-01-27 LAB — CUP PACEART REMOTE DEVICE CHECK
Battery Impedance: 1703 Ohm
Battery Remaining Longevity: 41 mo
Battery Voltage: 2.76 V
Brady Statistic AP VS Percent: 99 %
Brady Statistic AS VP Percent: 0 %
Brady Statistic AS VS Percent: 1 %
Implantable Lead Implant Date: 20090430
Implantable Lead Implant Date: 20090430
Implantable Lead Location: 753860
Implantable Lead Model: 5076
Implantable Lead Model: 5076
Lead Channel Impedance Value: 485 Ohm
Lead Channel Impedance Value: 509 Ohm
Lead Channel Pacing Threshold Pulse Width: 0.4 ms
Lead Channel Pacing Threshold Pulse Width: 0.4 ms
Lead Channel Sensing Intrinsic Amplitude: 11.2 mV
Lead Channel Setting Pacing Amplitude: 2 V
Lead Channel Setting Pacing Amplitude: 2.5 V
Lead Channel Setting Sensing Sensitivity: 4 mV
MDC IDC LEAD LOCATION: 753859
MDC IDC MSMT LEADCHNL RA PACING THRESHOLD AMPLITUDE: 0.75 V
MDC IDC MSMT LEADCHNL RV PACING THRESHOLD AMPLITUDE: 0.625 V
MDC IDC PG IMPLANT DT: 20090430
MDC IDC SESS DTM: 20190820202709
MDC IDC SET LEADCHNL RV PACING PULSEWIDTH: 0.4 ms
MDC IDC STAT BRADY AP VP PERCENT: 0 %

## 2018-02-15 ENCOUNTER — Other Ambulatory Visit: Payer: Self-pay | Admitting: Cardiovascular Disease

## 2018-02-17 NOTE — Telephone Encounter (Signed)
Please review for refill.  

## 2018-02-18 ENCOUNTER — Other Ambulatory Visit: Payer: Self-pay | Admitting: Cardiovascular Disease

## 2018-03-25 ENCOUNTER — Telehealth: Payer: Self-pay

## 2018-03-25 NOTE — Telephone Encounter (Signed)
LMOVM reminding pt to send remote transmission.   

## 2018-03-27 ENCOUNTER — Encounter: Payer: Self-pay | Admitting: Cardiology

## 2018-05-08 ENCOUNTER — Other Ambulatory Visit: Payer: Self-pay | Admitting: Cardiovascular Disease

## 2018-06-17 ENCOUNTER — Encounter: Payer: Self-pay | Admitting: Internal Medicine

## 2018-06-17 ENCOUNTER — Ambulatory Visit: Payer: 59 | Admitting: Internal Medicine

## 2018-06-17 VITALS — BP 152/84 | HR 73 | Ht 71.0 in | Wt 220.5 lb

## 2018-06-17 DIAGNOSIS — I495 Sick sinus syndrome: Secondary | ICD-10-CM

## 2018-06-17 DIAGNOSIS — Z950 Presence of cardiac pacemaker: Secondary | ICD-10-CM

## 2018-06-17 DIAGNOSIS — I480 Paroxysmal atrial fibrillation: Secondary | ICD-10-CM | POA: Diagnosis not present

## 2018-06-17 NOTE — Patient Instructions (Signed)
Medication Instructions:  - Your physician recommends that you continue on your current medications as directed. Please refer to the Current Medication list given to you today.  If you need a refill on your cardiac medications before your next appointment, please call your pharmacy.   Lab work: - Your physician recommends that you have lab work today: BMP/ CBC  If you have labs (blood work) drawn today and your tests are completely normal, you will receive your results only by: Marland Kitchen. MyChart Message (if you have MyChart) OR . A paper copy in the mail If you have any lab test that is abnormal or we need to change your treatment, we will call you to review the results.  Testing/Procedures: - none ordered  Follow-Up: At [facility], you and your health needs are our priority.  As part of our continuing mission to provide you with exceptional heart care, we have created designated Provider Care Teams.  These Care Teams include your primary Cardiologist (physician) and Advanced Practice Providers (APPs -  Physician Assistants and Nurse Practitioners) who all work together to provide you with the care you need, when you need it. You will need a follow up appointment in 1 year with Dr. Graciela HusbandsKlein.  Please call our office 2 months in advance to schedule this appointment.    Remote monitoring is used to monitor your Pacemaker of ICD from home. This monitoring reduces the number of office visits required to check your device to one time per year. It allows us to keep an eye on the functioning of your device to ensure it is working properly. You are scheduled for a device check from home on 09/16/2018. You may send your transmission at any time that day. If you have a wireless device, the transmission will be sent automatically. After your physician reviews your transmission, you will receive a postcard with your next transmission date.   Any Other Special Instructions Will Be Listed Below (If Applicable). -  N/A

## 2018-06-17 NOTE — Progress Notes (Signed)
      Patient Care Team: Malva Limes, MD as PCP - General (Family Medicine) Antonieta Iba, MD as Consulting Physician (Cardiology)   HPI  Max Lozano is a 68 y.o. male Has hx of pacer implanted for symptomatic bradycardia and atrial fibrillation CHADSVASC=3( age/HTN/DM) the latter recently diagnosed with a hemoglobin A1c of 6.5  On Rivaroxaban  No bleeding   Date Cr K Hgb  11/18 0.98 4.1 14.8          The patient denies chest pain, shortness of breath, nocturnal dyspnea, orthopnea or peripheral edema.  There have been no palpitations, lightheadedness or syncope.    Past Medical History:  Diagnosis Date  . Motion sickness    deep sea boats  . PPM-Medtronic 10/11/2009   Qualifier: Diagnosis of  By: Ladona Ridgel, MD, Jerrell Mylar   . Sick sinus syndrome [facility])    s/p pacer  . Syncope and collapse   . Wears dentures    full upper and lower    Past Surgical History:  Procedure Laterality Date  . COLONOSCOPY WITH PROPOFOL N/A 03/25/2015   Procedure: COLONOSCOPY WITH PROPOFOL;  Surgeon: Midge Minium, MD;  Location: [facility];  Service: Endoscopy;  Laterality: N/A;  ULCER ASCENDING COLON BX.   . INSERT / REPLACE / REMOVE PACEMAKER  09/04/07   Medtronic    Current Outpatient Medications  Medication Sig Dispense Refill  . atorvastatin (LIPITOR) 10 MG tablet TAKE 1 TABLET(10 MG) BY MOUTH DAILY 90 tablet 3  . diltiazem (CARDIZEM CD) 240 MG 24 hr capsule TAKE ONE CAPSULE BY MOUTH DAILY 30 capsule 5  . fish oil-omega-3 fatty acids 1000 MG capsule Take 1 g by mouth 2 (two) times daily.      . propranolol ER (INDERAL LA) 80 MG 24 hr capsule Take 1 capsule (80 mg total) by mouth daily. 90 capsule 3  . XARELTO 20 MG TABS tablet TAKE 1 TABLET BY MOUTH EVERY DAY WITH SUPPER 90 tablet 1   No current facility-administered medications for this visit.     Allergies  Allergen Reactions  . Atenolol Rash    Review of Systems negative except from HPI and  PMH  Physical Exam BP (!) 152/84 (BP Location: Left Arm, Patient Position: Sitting, Cuff Size: Normal)   Pulse 73   Ht 5\' 11"  (1.803 m)   Wt 220 lb 8 oz (100 kg)   BMI 30.75 kg/m  Well developed and well nourished in no acute distress HENT normal Neck supple with JVP-flat Clear Device pocket well healed; without hematoma or erythema.  There is no tethering  Regular rate and rhythm, no  gallop No  murmur Abd-soft with active BS No Clubbing cyanosis  edema Skin-warm and dry A & Oriented  Grossly normal sensory and motor function   ECG Apacing @ 73 20/09/37  Assessment and  Plan  Sinus node dysfunction  100%    Paroxysmal atrial fibrillation burden 15.7%  Pacemaker-Medtronic  The patient's device was interrogated.  The information was reviewed. No changes were made in the programming.    Hypertension  Diabetes  Sleep disordered breathing   BP elevated  Asked him to get home BP machine  On Anticoagulation;  No bleeding issues   Unaware of low burden afib -3.8%

## 2018-06-18 LAB — BASIC METABOLIC PANEL
BUN / CREAT RATIO: 11 (ref 10–24)
BUN: 10 mg/dL (ref 8–27)
CO2: 17 mmol/L — ABNORMAL LOW (ref 20–29)
CREATININE: 0.93 mg/dL (ref 0.76–1.27)
Calcium: 9.6 mg/dL (ref 8.6–10.2)
Chloride: 102 mmol/L (ref 96–106)
GFR calc Af Amer: 99 mL/min/{1.73_m2} (ref >59)
GFR, EST NON AFRICAN AMERICAN: 86 mL/min/{1.73_m2} (ref >59)
GLUCOSE: 108 mg/dL — AB (ref 65–99)
Potassium: 4.3 mmol/L (ref 3.5–5.2)
Sodium: 135 mmol/L (ref 134–144)

## 2018-06-18 LAB — CBC
Hematocrit: 45.4 % (ref 37.5–51.0)
Hemoglobin: 15.4 g/dL (ref 13.0–17.7)
MCH: 31.2 pg (ref 26.6–33.0)
MCHC: 33.9 g/dL (ref 31.5–35.7)
MCV: 92 fL (ref 79–97)
Platelets: 130 10*3/uL — ABNORMAL LOW (ref 150–450)
RBC: 4.94 x10E6/uL (ref 4.14–5.80)
RDW: 14 % (ref 11.6–15.4)
WBC: 11.1 10*3/uL — AB (ref 3.4–10.8)

## 2018-07-23 ENCOUNTER — Other Ambulatory Visit: Payer: Self-pay | Admitting: Cardiovascular Disease

## 2018-07-23 LAB — CUP PACEART INCLINIC DEVICE CHECK
Battery Remaining Longevity: 37 mo
Battery Voltage: 2.76 V
Brady Statistic AP VS Percent: 98 %
Brady Statistic AS VP Percent: 0 %
Implantable Lead Implant Date: 20090430
Implantable Lead Location: 753859
Implantable Lead Model: 5076
Lead Channel Impedance Value: 519 Ohm
Lead Channel Pacing Threshold Amplitude: 0.5 V
Lead Channel Pacing Threshold Amplitude: 0.75 V
Lead Channel Pacing Threshold Pulse Width: 0.4 ms
Lead Channel Pacing Threshold Pulse Width: 0.4 ms
Lead Channel Sensing Intrinsic Amplitude: 15.67 mV
Lead Channel Setting Pacing Pulse Width: 0.4 ms
Lead Channel Setting Sensing Sensitivity: 5.6 mV
MDC IDC LEAD IMPLANT DT: 20090430
MDC IDC LEAD LOCATION: 753860
MDC IDC MSMT BATTERY IMPEDANCE: 1905 Ohm
MDC IDC MSMT LEADCHNL RA IMPEDANCE VALUE: 524 Ohm
MDC IDC PG IMPLANT DT: 20090430
MDC IDC SESS DTM: 20200211185331
MDC IDC SET LEADCHNL RA PACING AMPLITUDE: 2 V
MDC IDC SET LEADCHNL RV PACING AMPLITUDE: 2.5 V
MDC IDC STAT BRADY AP VP PERCENT: 0 %
MDC IDC STAT BRADY AS VS PERCENT: 2 %

## 2018-09-16 ENCOUNTER — Other Ambulatory Visit: Payer: Self-pay

## 2018-09-16 ENCOUNTER — Ambulatory Visit (INDEPENDENT_AMBULATORY_CARE_PROVIDER_SITE_OTHER): Payer: 59 | Admitting: *Deleted

## 2018-09-16 DIAGNOSIS — I495 Sick sinus syndrome: Secondary | ICD-10-CM

## 2018-09-16 DIAGNOSIS — I480 Paroxysmal atrial fibrillation: Secondary | ICD-10-CM

## 2018-09-17 ENCOUNTER — Telehealth: Payer: Self-pay

## 2018-09-17 NOTE — Telephone Encounter (Signed)
Left message for patient to remind of missed remote transmission.  

## 2018-09-19 LAB — CUP PACEART REMOTE DEVICE CHECK
Battery Impedance: 1969 Ohm
Battery Remaining Longevity: 35 mo
Battery Voltage: 2.75 V
Brady Statistic AP VP Percent: 0 %
Brady Statistic AP VS Percent: 98 %
Brady Statistic AS VP Percent: 0 %
Brady Statistic AS VS Percent: 2 %
Date Time Interrogation Session: 20200514211129
Implantable Lead Implant Date: 20090430
Implantable Lead Implant Date: 20090430
Implantable Lead Location: 753859
Implantable Lead Location: 753860
Implantable Lead Model: 5076
Implantable Lead Model: 5076
Implantable Pulse Generator Implant Date: 20090430
Lead Channel Impedance Value: 432 Ohm
Lead Channel Impedance Value: 500 Ohm
Lead Channel Pacing Threshold Amplitude: 0.5 V
Lead Channel Pacing Threshold Amplitude: 0.625 V
Lead Channel Pacing Threshold Pulse Width: 0.4 ms
Lead Channel Pacing Threshold Pulse Width: 0.4 ms
Lead Channel Setting Pacing Amplitude: 2 V
Lead Channel Setting Pacing Amplitude: 2.5 V
Lead Channel Setting Pacing Pulse Width: 0.4 ms
Lead Channel Setting Sensing Sensitivity: 4 mV

## 2018-10-03 NOTE — Progress Notes (Signed)
Remote pacemaker transmission.   

## 2018-10-20 ENCOUNTER — Other Ambulatory Visit: Payer: Self-pay | Admitting: Cardiovascular Disease

## 2018-10-20 NOTE — Telephone Encounter (Signed)
Refill Request.  

## 2018-10-20 NOTE — Telephone Encounter (Signed)
Pt needs appointment I will contact to make pt aware and schedule if possible.

## 2018-10-20 NOTE — Telephone Encounter (Signed)
Please disregard NL office fwd as error this is a Research scientist (physical sciences) Refill.

## 2018-10-20 NOTE — Telephone Encounter (Signed)
Pt needs appointment will contact pt to make him aware.

## 2018-10-21 NOTE — Telephone Encounter (Signed)
Pt needs appointment pt hasn't been seen in over yr with Gollan. LS 07/2017

## 2018-10-23 ENCOUNTER — Telehealth: Payer: Self-pay

## 2018-10-23 NOTE — Telephone Encounter (Signed)
Called patient to schedule an appointment.  No answer. LMOV.  

## 2018-10-28 ENCOUNTER — Telehealth: Payer: Self-pay

## 2018-10-28 NOTE — Telephone Encounter (Signed)
Patient needs to schedule a follow up appointment with Dr. Rockey Situ before anymore refills given. The patient has been contacted several times regarding missed appointments and to call and schedule an appointment.

## 2018-10-28 NOTE — Telephone Encounter (Signed)
lmov . See previous note.  Closing this encounter

## 2018-12-01 ENCOUNTER — Other Ambulatory Visit: Payer: Self-pay | Admitting: Cardiovascular Disease

## 2018-12-02 ENCOUNTER — Other Ambulatory Visit: Payer: Self-pay

## 2018-12-02 MED ORDER — DILTIAZEM HCL ER COATED BEADS 240 MG PO CP24: 240.0000 mg | ORAL_CAPSULE | Freq: Every day | ORAL | 0 refills | Status: DC

## 2018-12-02 MED ORDER — PROPRANOLOL HCL ER 80 MG PO CP24: 80.0000 mg | ORAL_CAPSULE | Freq: Every day | ORAL | 0 refills | Status: DC

## 2018-12-02 NOTE — Telephone Encounter (Signed)
*  STAT* If patient is at the pharmacy, call can be transferred to refill team.   1. Which medications need to be refilled? (please list name of each medication and dose if known) Diltiazem , Propranolol  2. Which pharmacy/location (including street and city if local pharmacy) is medication to be sent to? [facility]  3. Do they need a 30 day or 90 day supply? [place]

## 2018-12-08 ENCOUNTER — Telehealth: Payer: Self-pay

## 2018-12-08 ENCOUNTER — Other Ambulatory Visit: Payer: Self-pay

## 2018-12-08 DIAGNOSIS — J029 Acute pharyngitis, unspecified: Secondary | ICD-10-CM

## 2018-12-08 DIAGNOSIS — R519 Headache, unspecified: Secondary | ICD-10-CM

## 2018-12-08 DIAGNOSIS — R05 Cough: Secondary | ICD-10-CM

## 2018-12-08 DIAGNOSIS — Z20822 Contact with and (suspected) exposure to covid-19: Secondary | ICD-10-CM

## 2018-12-08 DIAGNOSIS — R059 Cough, unspecified: Secondary | ICD-10-CM

## 2018-12-08 NOTE — Telephone Encounter (Signed)
Patient requesting COVID19 test, due to sore throat, headache, and cough.

## 2018-12-08 NOTE — Telephone Encounter (Signed)
OK to order, need to complete questions on order.

## 2018-12-08 NOTE — Telephone Encounter (Signed)
Pt advised.  Order placed.    Thanks,   -Mickel Baas

## 2018-12-09 LAB — NOVEL CORONAVIRUS, NAA: SARS-CoV-2, NAA: NOT DETECTED

## 2018-12-10 ENCOUNTER — Telehealth: Payer: Self-pay | Admitting: Family Medicine

## 2018-12-10 ENCOUNTER — Telehealth: Payer: Self-pay

## 2018-12-10 NOTE — Telephone Encounter (Signed)
Patient notified of lab results. Patient is still having a cough and drainage. He is requesting some medication for the symptoms. Please advise.

## 2018-12-10 NOTE — Telephone Encounter (Signed)
Pt continues to cough.  Pt was told that a medication would be called in for this. It has not been called in yet.  Please call pt's wife Hoyle Sauer back to let her know on this.  Thanks, [employer]

## 2018-12-10 NOTE — Telephone Encounter (Signed)
Either cold virus or allergies. Try OTC Allegra and Mucinex (guaifenesin) if not improved on those over the weekend then will need office visit or virtual office visit.

## 2018-12-10 NOTE — Telephone Encounter (Signed)
-----   Message from Birdie Sons, MD sent at 12/09/2018  4:27 PM EDT ----- Coronavirus test is negative.

## 2018-12-10 NOTE — Telephone Encounter (Signed)
Pt advised.   Thanks,   -Laura  

## 2018-12-16 ENCOUNTER — Ambulatory Visit (INDEPENDENT_AMBULATORY_CARE_PROVIDER_SITE_OTHER): Payer: 59 | Admitting: *Deleted

## 2018-12-16 DIAGNOSIS — I495 Sick sinus syndrome: Secondary | ICD-10-CM | POA: Diagnosis not present

## 2018-12-18 LAB — CUP PACEART REMOTE DEVICE CHECK
Battery Impedance: 2059 Ohm
Battery Remaining Longevity: 34 mo
Battery Voltage: 2.75 V
Brady Statistic AP VP Percent: 0 %
Brady Statistic AP VS Percent: 98 %
Brady Statistic AS VP Percent: 0 %
Brady Statistic AS VS Percent: 2 %
Date Time Interrogation Session: 20200812202449
Implantable Lead Implant Date: 20090430
Implantable Lead Implant Date: 20090430
Implantable Lead Location: 753859
Implantable Lead Location: 753860
Implantable Lead Model: 5076
Implantable Lead Model: 5076
Implantable Pulse Generator Implant Date: 20090430
Lead Channel Impedance Value: 494 Ohm
Lead Channel Impedance Value: 536 Ohm
Lead Channel Pacing Threshold Amplitude: 0.5 V
Lead Channel Pacing Threshold Amplitude: 0.75 V
Lead Channel Pacing Threshold Pulse Width: 0.4 ms
Lead Channel Pacing Threshold Pulse Width: 0.4 ms
Lead Channel Setting Pacing Amplitude: 2 V
Lead Channel Setting Pacing Amplitude: 2.5 V
Lead Channel Setting Pacing Pulse Width: 0.4 ms
Lead Channel Setting Sensing Sensitivity: 4 mV

## 2018-12-21 NOTE — Progress Notes (Signed)
Patient ID: Max Lozano, male   DOB: 01/27/1951, 68 y.o.   MRN: [id number] Cardiology Office Note  Date:  12/22/2018   ID:  Max Lozano, DOB 01/05/1951, MRN [id number]  PCP:  Max LimesFisher, Donald E, MD   Chief Complaint  Patient presents with  . other    12 mo f/u. medications reviewed verbally.     HPI:  Max Lozano is a 68 year old male with a history of  hypertension,  Paroxysmal atrial fibrillation (Relatively asymptomatic), tachy-brady syndrome  s/p pacemaker  ETT 08/2007 with excellent exercise capacity no evidence of ischemia  ECHO 2009: EF 55% Carotid ultrasound 2014 with minimal plaque b/l  for bruit On CPAP for OSA who presents for routine followup of his atrial fibrillation, HTN  High BP at home 150s on regular basis Was better in 2018 on lisinopril  pacer download: Atrial fib 4.8% of the time Atrial fibrillation burden has decreased on propranolol with diltiazem previously 14% at the end of 2017  OSA, on cpap  denies any tachycardia concerning for atrial fibrillation  EKG personally reviewed by myself on todays visit Shows normal sinus rhythm rate 63 bpm no significant ST or T wave changes  Other past medical history In 2017, labs as below HBa1C 6.5 togtal chol 157, LDL 78  mild carotid arterial disease bilaterally (bruit on exam out of proportion to his disease from 2014, suspect tortuous vessel)  Hospitalized August 16 2014 with discharge 08/18/2014 with upper respiratory infection Acute onset of shortness of breath, treated without biotics  significant family history of coronary artery disease with father who had MI in his late 3550s, he was a smoker, mother with congestive heart failure in her 3880s.   PMH:   has a past medical history of Motion sickness, PPM-Medtronic (10/11/2009), Sick sinus syndrome (HCC), Syncope and collapse, and Wears dentures.  PSH:    Past Surgical History:  Procedure Laterality Date  . COLONOSCOPY WITH PROPOFOL N/A 03/25/2015   Procedure: COLONOSCOPY WITH PROPOFOL;  Surgeon: Midge Miniumarren Wohl, MD;  [facility];  Service: Endoscopy;  Laterality: N/A;  ULCER ASCENDING COLON BX.   . INSERT / REPLACE / REMOVE PACEMAKER  09/04/07   Medtronic    Current Outpatient Medications  Medication Sig Dispense Refill  . atorvastatin (LIPITOR) 10 MG tablet TAKE 1 TABLET(10 MG) BY MOUTH DAILY 90 tablet 3  . diltiazem (CARDIZEM CD) 240 MG 24 hr capsule Take 1 capsule (240 mg total) by mouth daily. 30 capsule 0  . fish oil-omega-3 fatty acids 1000 MG capsule Take 1 g by mouth 2 (two) times daily.      . propranolol ER (INDERAL LA) 80 MG 24 hr capsule Take 1 capsule (80 mg total) by mouth daily. 30 capsule 0  . XARELTO 20 MG TABS tablet TAKE 1 TABLET BY MOUTH EVERY DAY WITH SUPPER 90 tablet 1   No current facility-administered medications for this visit.     Allergies:   Atenolol   Social History:  The patient  reports that he has never smoked. He has never used smokeless tobacco. He reports that he does not drink alcohol or use drugs.   Family History:   family history includes Diabetes in his mother; Heart attack in his father.   Review of Systems: Review of Systems  Constitutional: Negative.   Respiratory: Negative.   Cardiovascular: Negative.   Gastrointestinal: Negative.   Musculoskeletal: Negative.   Neurological: Negative.   Psychiatric/Behavioral: Negative.   All other systems reviewed and are  negative.   PHYSICAL EXAM: VS:  BP (!) 156/84 (BP Location: Left Arm, Patient Position: Sitting, Cuff Size: Normal)   Pulse 63   Wt 214 lb (97.1 kg)   SpO2 98%   BMI 29.85 kg/m  , BMI Body mass index is 29.85 kg/m. Constitutional:  oriented to person, place, and time. No distress.  HENT:  Head: Grossly normal Eyes:  no discharge. No scleral icterus.  Neck: No JVD, no carotid bruits  Cardiovascular: Regular rate and rhythm, no murmurs appreciated Pulmonary/Chest: Clear to auscultation bilaterally, no  wheezes or rails Abdominal: Soft.  no distension.  no tenderness.  Musculoskeletal: Normal range of motion Neurological:  normal muscle tone. Coordination normal. No atrophy Skin: Skin warm and dry Psychiatric: normal affect, pleasant   Recent Labs: 06/17/2018: BUN 10; Creatinine, Ser 0.93; Hemoglobin 15.4; Platelets 130; Potassium 4.3; Sodium 135   Labs will be drawn in the office today  Lipid Panel Lab Results  Component Value Date   CHOL 157 10/06/2015   HDL 37 (L) 10/06/2015   LDLCALC 78 10/06/2015   TRIG 211 (H) 10/06/2015    Labs will be drawn in the office today  Wt Readings from Last 3 Encounters:  12/22/18 214 lb (97.1 kg)  06/17/18 220 lb 8 oz (100 kg)  07/15/17 212 lb 8 oz (96.4 kg)      ASSESSMENT AND PLAN:  Hyperlipidemia - No recent lipid panel available Previously close to goal No changes made to his Lipitor  Elevated glucose -  Hemoglobin A1c 6.5 in 2017  Encouraged low carbohydrate diet  Paroxysmal atrial fibrillation (HCC) Lower atrial fibrillation burden on current medication regiment  reports feeling well on beta-blocker and calcium channel blocker/diltiazem Pacer download reviewed with him in detail  Carotid stenosis  ultrasound in 2014 showing mild bilateral carotid disease Continue aggressive lipid management stable  PPM-Medtronic Followed by Dr. Patricia Nettle node dysfunction [facility]) Pacemaker, no symptoms Stable  HYPERTENSION, BENIGN ESSENTIAL Blood pressure is well controlled on today's visit. No changes made to the medications.   Disposition:   F/U  12 months   Total encounter time more than 25 minutes  Greater than 50% was spent in counseling and coordination of care with the patient    No orders of the defined types were placed in this encounter.  Signed, Esmond Plants, M.D., Ph.D. 12/22/2018  [place], [place]

## 2018-12-22 ENCOUNTER — Encounter: Payer: Self-pay | Admitting: Cardiovascular Disease

## 2018-12-22 ENCOUNTER — Other Ambulatory Visit: Payer: Self-pay

## 2018-12-22 ENCOUNTER — Ambulatory Visit (INDEPENDENT_AMBULATORY_CARE_PROVIDER_SITE_OTHER): Payer: 59 | Admitting: Cardiovascular Disease

## 2018-12-22 VITALS — BP 156/84 | HR 63 | Wt 214.0 lb

## 2018-12-22 DIAGNOSIS — I495 Sick sinus syndrome: Secondary | ICD-10-CM

## 2018-12-22 DIAGNOSIS — I10 Essential (primary) hypertension: Secondary | ICD-10-CM

## 2018-12-22 DIAGNOSIS — I480 Paroxysmal atrial fibrillation: Secondary | ICD-10-CM | POA: Diagnosis not present

## 2018-12-22 DIAGNOSIS — Z950 Presence of cardiac pacemaker: Secondary | ICD-10-CM | POA: Diagnosis not present

## 2018-12-22 DIAGNOSIS — E782 Mixed hyperlipidemia: Secondary | ICD-10-CM

## 2018-12-22 MED ORDER — LISINOPRIL 20 MG PO TABS: 20.0000 mg | ORAL_TABLET | Freq: Every day | ORAL | 3 refills | Status: AC

## 2018-12-22 NOTE — Patient Instructions (Addendum)
Medication Instructions:  Lisinopril 20 mg daily  Coupon for xarelto  If you need a refill on your cardiac medications before your next appointment, please call your pharmacy.    Lab work: No new labs needed   If you have labs (blood work) drawn today and your tests are completely normal, you will receive your results only by: Marland Kitchen MyChart Message (if you have MyChart) OR . A paper copy in the mail If you have any lab test that is abnormal or we need to change your treatment, we will call you to review the results.   Testing/Procedures: No new testing needed   Follow-Up: At [facility], you and your health needs are our priority.  As part of our continuing mission to provide you with exceptional heart care, we have created designated Provider Care Teams.  These Care Teams include your primary Cardiologist (physician) and Advanced Practice Providers (APPs -  Physician Assistants and Nurse Practitioners) who all work together to provide you with the care you need, when you need it.  . You will need a follow up appointment in 12 months .   Please call our office 2 months in advance to schedule this appointment.    . Providers on your designated Care Team:   . Murray Hodgkins, NP . Christell Faith, PA-C . Marrianne Mood, PA-C  Any Other Special Instructions Will Be Listed Below (If Applicable).  For educational health videos Log in to : [email or web address] Or : [email or web address], password : [email or web address]

## 2018-12-23 ENCOUNTER — Other Ambulatory Visit: Payer: Self-pay | Admitting: Cardiovascular Disease

## 2018-12-23 NOTE — Telephone Encounter (Signed)
Last OV 12/22/2018 Scr 0.93 on 06/17/2018 crcl 182ml/min

## 2018-12-23 NOTE — Telephone Encounter (Signed)
Please review for refill. Thank you! 

## 2018-12-24 ENCOUNTER — Encounter: Payer: Self-pay | Admitting: Cardiology

## 2018-12-24 NOTE — Progress Notes (Signed)
Remote pacemaker transmission.   

## 2019-01-01 ENCOUNTER — Other Ambulatory Visit: Payer: Self-pay | Admitting: *Deleted

## 2019-01-01 ENCOUNTER — Telehealth: Payer: Self-pay | Admitting: Cardiovascular Disease

## 2019-01-01 MED ORDER — DILTIAZEM HCL ER COATED BEADS 240 MG PO CP24: 240.0000 mg | ORAL_CAPSULE | Freq: Every day | ORAL | 5 refills | Status: AC

## 2019-01-01 MED ORDER — PROPRANOLOL HCL ER 80 MG PO CP24: 80.0000 mg | ORAL_CAPSULE | Freq: Every day | ORAL | 5 refills | Status: AC

## 2019-01-01 NOTE — Telephone Encounter (Signed)
Requested Prescriptions   Signed Prescriptions Disp Refills  . diltiazem (CARDIZEM CD) 240 MG 24 hr capsule 30 capsule 5    Sig: Take 1 capsule (240 mg total) by mouth daily.    Authorizing Provider: Minna Merritts    Ordering User: Eugenio Hoes, Mulki Roesler C  . propranolol ER (INDERAL LA) 80 MG 24 hr capsule 30 capsule 5    Sig: Take 1 capsule (80 mg total) by mouth daily.    Authorizing Provider: Minna Merritts    Ordering User: Britt Bottom

## 2019-01-01 NOTE — Telephone Encounter (Signed)
°*  STAT* If patient is at the pharmacy, call can be transferred to refill team.   1. Which medications need to be refilled? (please list name of each medication and dose if known)    Propanolol 80 mg po q d    Diltiazem 240 mg po q d      2. Which pharmacy/location (including street and city if local pharmacy) is medication to be sent to? [facility]   3. Do they need a 30 day or 90 day supply? [place]

## 2019-01-01 NOTE — Telephone Encounter (Signed)
Requested Prescriptions   Signed Prescriptions Disp Refills  . diltiazem (CARDIZEM CD) 240 MG 24 hr capsule 30 capsule 5    Sig: Take 1 capsule (240 mg total) by mouth daily.    Authorizing Provider: GOLLAN, TIMOTHY J    Ordering User: Myisha Pickerel C  . propranolol ER (INDERAL LA) 80 MG 24 hr capsule 30 capsule 5    Sig: Take 1 capsule (80 mg total) by mouth daily.    Authorizing Provider: GOLLAN, TIMOTHY J    Ordering User: Mikhayla Phillis C   

## 2019-03-17 ENCOUNTER — Ambulatory Visit (INDEPENDENT_AMBULATORY_CARE_PROVIDER_SITE_OTHER): Payer: 59 | Admitting: *Deleted

## 2019-03-17 DIAGNOSIS — I480 Paroxysmal atrial fibrillation: Secondary | ICD-10-CM

## 2019-03-17 DIAGNOSIS — I495 Sick sinus syndrome: Secondary | ICD-10-CM

## 2019-03-18 LAB — CUP PACEART REMOTE DEVICE CHECK
Battery Impedance: 2216 Ohm
Battery Remaining Longevity: 32 mo
Battery Voltage: 2.75 V
Brady Statistic AP VP Percent: 0 %
Brady Statistic AP VS Percent: 98 %
Brady Statistic AS VP Percent: 0 %
Brady Statistic AS VS Percent: 2 %
Date Time Interrogation Session: 20201111165925
Implantable Lead Implant Date: 20090430
Implantable Lead Implant Date: 20090430
Implantable Lead Location: 753859
Implantable Lead Location: 753860
Implantable Lead Model: 5076
Implantable Lead Model: 5076
Implantable Pulse Generator Implant Date: 20090430
Lead Channel Impedance Value: 491 Ohm
Lead Channel Impedance Value: 522 Ohm
Lead Channel Pacing Threshold Amplitude: 0.5 V
Lead Channel Pacing Threshold Amplitude: 0.75 V
Lead Channel Pacing Threshold Pulse Width: 0.4 ms
Lead Channel Pacing Threshold Pulse Width: 0.4 ms
Lead Channel Setting Pacing Amplitude: 2 V
Lead Channel Setting Pacing Amplitude: 2.5 V
Lead Channel Setting Pacing Pulse Width: 0.4 ms
Lead Channel Setting Sensing Sensitivity: 4 mV

## 2019-03-19 ENCOUNTER — Telehealth: Payer: Self-pay

## 2019-03-19 NOTE — Telephone Encounter (Signed)
Left message for patient to remind of missed remote transmission. LL 

## 2019-04-07 NOTE — Progress Notes (Signed)
Remote pacemaker transmission.   

## 2019-05-07 ENCOUNTER — Other Ambulatory Visit: Payer: Self-pay

## 2019-05-07 ENCOUNTER — Emergency Department: Payer: 59

## 2019-05-07 ENCOUNTER — Emergency Department
Admission: EM | Admit: 2019-05-07 | Discharge: 2019-05-08 | Disposition: A | Discharge status: Other Acute Inpatient Hospital | Payer: 59 | Attending: Emergency Medicine | Admitting: Emergency Medicine

## 2019-05-07 DIAGNOSIS — I4891 Unspecified atrial fibrillation: Secondary | ICD-10-CM | POA: Diagnosis not present

## 2019-05-07 DIAGNOSIS — Z7901 Long term (current) use of anticoagulants: Secondary | ICD-10-CM | POA: Insufficient documentation

## 2019-05-07 DIAGNOSIS — I10 Essential (primary) hypertension: Secondary | ICD-10-CM | POA: Diagnosis not present

## 2019-05-07 DIAGNOSIS — R0602 Shortness of breath: Secondary | ICD-10-CM | POA: Diagnosis present

## 2019-05-07 DIAGNOSIS — U071 COVID-19: Secondary | ICD-10-CM | POA: Insufficient documentation

## 2019-05-07 DIAGNOSIS — Z79899 Other long term (current) drug therapy: Secondary | ICD-10-CM | POA: Insufficient documentation

## 2019-05-07 LAB — COMPREHENSIVE METABOLIC PANEL
ALT: 28 U/L (ref 0–44)
AST: 44 U/L — ABNORMAL HIGH (ref 15–41)
Albumin: 3.7 g/dL (ref 3.5–5.0)
Alkaline Phosphatase: 64 U/L (ref 38–126)
Anion gap: 10 (ref 5–15)
BUN: 21 mg/dL (ref 8–23)
CO2: 23 mmol/L (ref 22–32)
Calcium: 8.6 mg/dL — ABNORMAL LOW (ref 8.9–10.3)
Chloride: 102 mmol/L (ref 98–111)
Creatinine, Ser: 1.03 mg/dL (ref 0.61–1.24)
GFR calc Af Amer: >60 mL/min (ref >60)
GFR calc non Af Amer: >60 mL/min (ref >60)
Glucose, Bld: 229 mg/dL — ABNORMAL HIGH (ref 70–99)
Potassium: 4.1 mmol/L (ref 3.5–5.1)
Sodium: 135 mmol/L (ref 135–145)
Total Bilirubin: 1 mg/dL (ref 0.3–1.2)
Total Protein: 7.5 g/dL (ref 6.5–8.1)

## 2019-05-07 LAB — CBC WITH DIFFERENTIAL/PLATELET
Abs Immature Granulocytes: 0.03 10*3/uL (ref 0.00–0.07)
Basophils Absolute: 0 10*3/uL (ref 0.0–0.1)
Basophils Relative: 0 %
Eosinophils Absolute: 0 10*3/uL (ref 0.0–0.5)
Eosinophils Relative: 0 %
HCT: 42.7 % (ref 39.0–52.0)
Hemoglobin: 14.8 g/dL (ref 13.0–17.0)
Immature Granulocytes: 0 %
Lymphocytes Relative: 28 %
Lymphs Abs: 1.9 10*3/uL (ref 0.7–4.0)
MCH: 30.8 pg (ref 26.0–34.0)
MCHC: 34.7 g/dL (ref 30.0–36.0)
MCV: 88.8 fL (ref 80.0–100.0)
Monocytes Absolute: 0.5 10*3/uL (ref 0.1–1.0)
Monocytes Relative: 8 %
Neutro Abs: 4.5 10*3/uL (ref 1.7–7.7)
Neutrophils Relative %: 64 %
Platelets: 97 10*3/uL — ABNORMAL LOW (ref 150–400)
RBC: 4.81 MIL/uL (ref 4.22–5.81)
RDW: 13.9 % (ref 11.5–15.5)
WBC: 7 10*3/uL (ref 4.0–10.5)
nRBC: 0 % (ref 0.0–0.2)

## 2019-05-07 LAB — TROPONIN I (HIGH SENSITIVITY): Troponin I (High Sensitivity): 15 ng/L (ref <18)

## 2019-05-07 LAB — TRIGLYCERIDES: Triglycerides: 97 mg/dL (ref <150)

## 2019-05-07 LAB — FIBRIN DERIVATIVES D-DIMER (ARMC ONLY): Fibrin derivatives D-dimer (ARMC): 772.25 ng/mL (FEU) — ABNORMAL HIGH (ref 0.00–499.00)

## 2019-05-07 LAB — FIBRINOGEN: Fibrinogen: 734 mg/dL — ABNORMAL HIGH (ref 210–475)

## 2019-05-07 MED ORDER — SODIUM CHLORIDE 0.9 % IV SOLN
1.0000 g | Freq: Once | INTRAVENOUS | Status: AC
  Administered 2019-05-07: 1 g via INTRAVENOUS
  Filled 2019-05-07: qty 10

## 2019-05-07 MED ORDER — DEXAMETHASONE SODIUM PHOSPHATE 10 MG/ML IJ SOLN
10.0000 mg | Freq: Once | INTRAMUSCULAR | Status: AC
  Administered 2019-05-07: 10 mg via INTRAVENOUS
  Filled 2019-05-07: qty 1

## 2019-05-07 MED ORDER — SODIUM CHLORIDE 0.9 % IV SOLN
500.0000 mg | Freq: Once | INTRAVENOUS | Status: AC
  Administered 2019-05-08: 500 mg via INTRAVENOUS
  Filled 2019-05-07: qty 500

## 2019-05-07 NOTE — ED Provider Notes (Signed)
[facility] Emergency Department Provider Note  ____________________________________________   First MD Initiated Contact with Patient 05/07/19 2319     (approximate)  I have reviewed the triage vital signs and the nursing notes.   HISTORY  Chief Complaint No chief complaint on file.   HPI Max Lozano is a 68 y.o. male with below list of previous medical conditions presents to the emergency department secondary to generalized malaise myalgia dyspnea, nonproductive cough and fever x4 days.  States that his wife tested positive for COVID-19 on April 26, 2019 and that he was subsequently tested but it was negative.  EMS states on their arrival the patient's oxygen saturation was 80% with 6 L oxygen via nasal cannula patient's current oxygen saturation is 92%.        Past Medical History:  Diagnosis Date  . Motion sickness    deep sea boats  . PPM-Medtronic 10/11/2009   Qualifier: Diagnosis of  By: Ladona Ridgel, MD, Jerrell Mylar   . Sick sinus syndrome [place] [place])    s/p pacer  . Syncope and collapse   . Wears dentures    full upper and lower    Patient Active Problem List   Diagnosis Date Noted  . OSA (obstructive sleep apnea) 07/22/2017  . Ulceration of intestine   . Second degree hemorrhoids   . Right carotid bruit 10/01/2014  . PPM-Medtronic 10/11/2009  . Hyperlipidemia 11/25/2008  . HYPERTENSION, BENIGN ESSENTIAL 11/25/2008  . Atrial fibrillation (HCC) 11/25/2008  . Sinoatrial node dysfunction (HCC) 11/25/2008    Past Surgical History:  Procedure Laterality Date  . COLONOSCOPY WITH PROPOFOL N/A 03/25/2015   Procedure: COLONOSCOPY WITH PROPOFOL;  Surgeon: Midge Minium, MD;  Location: [facility];  Service: Endoscopy;  Laterality: N/A;  ULCER ASCENDING COLON BX.   [name] / REPLACE / REMOVE PACEMAKER  09/04/07   Medtronic    Prior to Admission medications   Medication Sig Start Date End Date Taking? Authorizing Provider    atorvastatin (LIPITOR) 10 MG tablet TAKE 1 TABLET(10 MG) BY MOUTH DAILY 05/08/18   Antonieta Iba, MD  diltiazem (CARDIZEM CD) 240 MG 24 hr capsule Take 1 capsule (240 mg total) by mouth daily. 01/01/19   Antonieta Iba, MD  fish oil-omega-3 fatty acids 1000 MG capsule Take 1 g by mouth 2 (two) times daily.      [provider]  lisinopril (ZESTRIL) 20 MG tablet Take 1 tablet (20 mg total) by mouth daily. 12/22/18 03/22/19  Antonieta Iba, MD  propranolol ER (INDERAL LA) 80 MG 24 hr capsule Take 1 capsule (80 mg total) by mouth daily. 01/01/19   Antonieta Iba, MD  XARELTO 20 MG TABS tablet TAKE 1 TABLET BY MOUTH ONCE DAILY WITH SUPPER 12/23/18   Antonieta Iba, MD    Allergies Atenolol  Family History  Problem Relation Age of Onset  . Diabetes Mother   . Heart attack Father     Social History Social History   Tobacco Use  . Smoking status: Never Smoker  . Smokeless tobacco: Never Used  Substance Use Topics  . Alcohol use: No  . Drug use: No    Review of Systems Constitutional: Positive for fever/chills Eyes: No visual changes. ENT: No sore throat. Cardiovascular: Denies chest pain. Respiratory: Positive for nonproductive cough and shortness of breath. Gastrointestinal: No abdominal pain.  No nausea, no vomiting.  No diarrhea.  No constipation. Genitourinary: Negative for dysuria. Musculoskeletal: Positive for generalized myalgia Integumentary:  Negative for rash. Neurological: Negative for headaches, focal weakness or numbness.   ____________________________________________   PHYSICAL EXAM:  VITAL SIGNS: ED Triage Vitals  Enc Vitals Group     BP 05/07/19 2224 (!) 141/61     Pulse Rate 05/07/19 2224 (!) 110     Resp --      Temp 05/07/19 2224 99 F (37.2 C)     Temp Source 05/07/19 2224 Oral     SpO2 05/07/19 2224 90 %     Weight 05/07/19 2225 98.4 kg (217 lb)     Height 05/07/19 2225 1.778 m (5\' 10" )     Head Circumference --      Peak  Flow --      Pain Score 05/07/19 2225 0     Pain Loc --      Pain Edu? --      Excl. in [place]? --     Constitutional: Alert and oriented.  Apparent dyspnea Eyes: Conjunctivae are normal.  Head: Atraumatic. Nose: No congestion/rhinnorhea. Mouth/Throat: Patient is wearing a mask. Neck: No stridor.  No meningeal signs.   Cardiovascular: Normal rate, regular rhythm. Good peripheral circulation. Grossly normal heart sounds. Respiratory: Tachypnea, diffuse rhonchi Gastrointestinal: Soft and nontender. No distention.  Musculoskeletal: No lower extremity tenderness nor edema. No gross deformities of extremities. Neurologic:  Normal speech and language. No gross focal neurologic deficits are appreciated.  Skin:  Skin is warm, dry and intact. Psychiatric: Mood and affect are normal. Speech and behavior are normal.  ____________________________________________   LABS (all labs ordered are listed, but only abnormal results are displayed)  Labs Reviewed  CBC WITH DIFFERENTIAL/PLATELET - Abnormal; Notable for the following components:      Result Value   Platelets 97 (*)    All other components within normal limits  COMPREHENSIVE METABOLIC PANEL - Abnormal; Notable for the following components:   Glucose, Bld 229 (*)    Calcium 8.6 (*)    AST 44 (*)    All other components within normal limits  FIBRIN DERIVATIVES D-DIMER ([facility] ONLY) - Abnormal; Notable for the following components:   Fibrin derivatives D-dimer ([facility]) 772.25 (*)    All other components within normal limits  LACTATE DEHYDROGENASE - Abnormal; Notable for the following components:   LDH 422 (*)    All other components within normal limits  FERRITIN - Abnormal; Notable for the following components:   Ferritin 992 (*)    All other components within normal limits  FIBRINOGEN - Abnormal; Notable for the following components:   Fibrinogen 734 (*)    All other components within normal limits  POC SARS CORONAVIRUS 2 AG -  Abnormal; Notable for the following components:   SARS Coronavirus 2 Ag POSITIVE (*)    All other components within normal limits  SARS CORONAVIRUS 2 (TAT 6-24 HRS)  CULTURE, BLOOD (ROUTINE X 2)  CULTURE, BLOOD (ROUTINE X 2)  LACTIC ACID, PLASMA  LACTIC ACID, PLASMA  PROCALCITONIN  TRIGLYCERIDES  C-REACTIVE PROTEIN  POC SARS CORONAVIRUS 2 AG -  ED  TROPONIN I (HIGH SENSITIVITY)  TROPONIN I (HIGH SENSITIVITY)   ____________________________________________  EKG  ED ECG REPORT I, Annetta [place] N Aza Dantes, the attending physician, personally viewed and interpreted this ECG.   Date: 05/07/2019  EKG Time: 10:32 PM  Rate: 65  Rhythm: Atrial paced rhythm  Axis: Normal  Intervals:normal  ST&T Change: none  ____________________________________________  RADIOLOGY I, [place] N Quintyn Dombek, personally viewed and evaluated these images (plain radiographs) as part of my  medical decision making, as well as reviewing the written report by the radiologist.  ED MD interpretation: Vague groundglass opacities midlung zone consistent with pneumonia per radiologist.  Official radiology report(s): DG Chest Portable 1 View  Result Date: 05/07/2019 CLINICAL DATA:  Shortness of breath EXAM: PORTABLE CHEST 1 VIEW COMPARISON:  10/15/2014 FINDINGS: Left-sided pacing device with leads over the right atrium and right ventricle. Vague ground-glass opacity in the mid lung zones. Enlarged cardiomediastinal silhouette. No pleural effusion or pneumothorax. IMPRESSION: Suspect vague ground-glass opacities in the mid lung zones as may be seen with pneumonia, possible atypical or viral pneumonia. Electronically Signed   By: Jasmine PangKim  Fujinaga M.D.   On: 05/07/2019 23:22    ____________________________________________   PROCEDURES    .Critical Care Performed by: Darci CurrentBrown, [place] N, MD Authorized by: Darci CurrentBrown, [place] N, MD   Critical care provider statement:    Critical care time (minutes):  45   Critical care time was  exclusive of:  Separately billable procedures and treating other patients   Critical care was necessary to treat or prevent imminent or life-threatening deterioration of the following conditions:  Respiratory failure   Critical care was time spent personally by me on the following activities:  Development of treatment plan with patient or surrogate, discussions with consultants, evaluation of patient's response to treatment, examination of patient, obtaining history from patient or surrogate, ordering and performing treatments and interventions, ordering and review of laboratory studies, ordering and review of radiographic studies, pulse oximetry, re-evaluation of patient's condition and review of old charts     ____________________________________________   INITIAL IMPRESSION / MDM / ASSESSMENT AND PLAN / ED COURSE  As part of my medical decision making, I reviewed the following data within the electronic MEDICAL RECORD NUMBER   68 year old male presented with above-stated history and physical exam with differential diagnosis including but not limited to COVID-19, pneumonia.  Patient's COVID-19 testing positive here in the emergency department chest x-ray consistent with pneumonia.  Patient received IV ceftriaxone 1 g azithromycin 500 mg, Decadron 10 mg IV.  Patient currently on 6 L via nasal cannula.  Patient [facility]     ____________________________________________  FINAL CLINICAL IMPRESSION(S) / ED DIAGNOSES  Final diagnoses:  COVID-19 virus infection     MEDICATIONS GIVEN DURING THIS VISIT:  Medications  remdesivir 200 mg in sodium chloride 0.9% 250 mL IVPB (200 mg Intravenous New Bag/Given 05/13/2019 0301)    Followed by  remdesivir 100 mg in sodium chloride 0.9 % 100 mL IVPB (has no administration in time range)  cefTRIAXone (ROCEPHIN) 1 g in sodium chloride 0.9 % 100 mL IVPB (0 g Intravenous Stopped 05/21/2019 0037)  azithromycin  (ZITHROMAX) 500 mg in sodium chloride 0.9 % 250 mL IVPB (0 mg Intravenous Stopped 06/07/2019 0301)  dexamethasone (DECADRON) injection 10 mg (10 mg Intravenous Given 05/07/19 2351)  sodium chloride 0.9 % bolus 1,000 mL (0 mLs Intravenous Stopped 05/21/2019 0133)     ED Discharge Orders    None      *Please note:  Allayne Stackerry L Hence was evaluated in Emergency Department on 05/27/2019 for the symptoms described in the history of present illness. He was evaluated in the context of the global COVID-19 pandemic, which necessitated consideration that the patient might be at risk for infection with the SARS-CoV-2 virus that causes COVID-19. Institutional protocols and algorithms that pertain to the evaluation of patients at risk for COVID-19 are in a state of rapid change  based on information released by regulatory bodies including the CDC and federal and state organizations. These policies and algorithms were followed during the patient's care in the ED.  Some ED evaluations and interventions may be delayed as a result of limited staffing during the pandemic.*  Note:  This document was prepared using Dragon voice recognition software and may include unintentional dictation errors.   Darci Current, MD 05/25/2019 [phone]

## 2019-05-07 NOTE — ED Triage Notes (Signed)
Patient's wife was dx with COVID on 12/20 and patient stated he started not feeling well a couple days ago. His breathing became an issue tonight and is short of breath and a little cough and had a fever at home. EMS said he was sating low 80's on RA. Patient is sating 56 on 6L [place] for this nurse in triage.

## 2019-05-08 ENCOUNTER — Encounter (HOSPITAL_COMMUNITY): Payer: Self-pay | Admitting: Internal Medicine

## 2019-05-08 ENCOUNTER — Other Ambulatory Visit: Payer: Self-pay

## 2019-05-08 ENCOUNTER — Inpatient Hospital Stay (HOSPITAL_COMMUNITY)
Admission: AD | Admit: 2019-05-08 | Discharge: 2019-06-08 | DRG: 207 | Disposition: E | Payer: Private Health Insurance - Indemnity | Source: Other Acute Inpatient Hospital | Attending: Pulmonary Disease | Admitting: Pulmonary Disease

## 2019-05-08 DIAGNOSIS — I4891 Unspecified atrial fibrillation: Secondary | ICD-10-CM | POA: Diagnosis not present

## 2019-05-08 DIAGNOSIS — R58 Hemorrhage, not elsewhere classified: Secondary | ICD-10-CM

## 2019-05-08 DIAGNOSIS — I82441 Acute embolism and thrombosis of right tibial vein: Secondary | ICD-10-CM | POA: Diagnosis present

## 2019-05-08 DIAGNOSIS — Z66 Do not resuscitate: Secondary | ICD-10-CM | POA: Diagnosis not present

## 2019-05-08 DIAGNOSIS — I4821 Permanent atrial fibrillation: Secondary | ICD-10-CM | POA: Diagnosis present

## 2019-05-08 DIAGNOSIS — N179 Acute kidney failure, unspecified: Secondary | ICD-10-CM | POA: Diagnosis not present

## 2019-05-08 DIAGNOSIS — R0602 Shortness of breath: Secondary | ICD-10-CM | POA: Diagnosis present

## 2019-05-08 DIAGNOSIS — I82411 Acute embolism and thrombosis of right femoral vein: Secondary | ICD-10-CM | POA: Diagnosis present

## 2019-05-08 DIAGNOSIS — R042 Hemoptysis: Secondary | ICD-10-CM | POA: Diagnosis not present

## 2019-05-08 DIAGNOSIS — J80 Acute respiratory distress syndrome: Secondary | ICD-10-CM | POA: Diagnosis not present

## 2019-05-08 DIAGNOSIS — Z79899 Other long term (current) drug therapy: Secondary | ICD-10-CM | POA: Diagnosis not present

## 2019-05-08 DIAGNOSIS — U071 COVID-19: Secondary | ICD-10-CM | POA: Diagnosis present

## 2019-05-08 DIAGNOSIS — T82528A Displacement of other cardiac and vascular devices and implants, initial encounter: Secondary | ICD-10-CM

## 2019-05-08 DIAGNOSIS — E872 Acidosis: Secondary | ICD-10-CM | POA: Diagnosis not present

## 2019-05-08 DIAGNOSIS — Z833 Family history of diabetes mellitus: Secondary | ICD-10-CM

## 2019-05-08 DIAGNOSIS — J1282 Pneumonia due to coronavirus disease 2019: Secondary | ICD-10-CM | POA: Diagnosis not present

## 2019-05-08 DIAGNOSIS — I482 Chronic atrial fibrillation, unspecified: Secondary | ICD-10-CM

## 2019-05-08 DIAGNOSIS — E874 Mixed disorder of acid-base balance: Secondary | ICD-10-CM | POA: Diagnosis not present

## 2019-05-08 DIAGNOSIS — E785 Hyperlipidemia, unspecified: Secondary | ICD-10-CM | POA: Diagnosis present

## 2019-05-08 DIAGNOSIS — Z01818 Encounter for other preprocedural examination: Secondary | ICD-10-CM

## 2019-05-08 DIAGNOSIS — E1165 Type 2 diabetes mellitus with hyperglycemia: Secondary | ICD-10-CM | POA: Diagnosis not present

## 2019-05-08 DIAGNOSIS — Z950 Presence of cardiac pacemaker: Secondary | ICD-10-CM

## 2019-05-08 DIAGNOSIS — J9601 Acute respiratory failure with hypoxia: Secondary | ICD-10-CM | POA: Diagnosis not present

## 2019-05-08 DIAGNOSIS — I10 Essential (primary) hypertension: Secondary | ICD-10-CM | POA: Diagnosis present

## 2019-05-08 DIAGNOSIS — Z6831 Body mass index (BMI) 31.0-31.9, adult: Secondary | ICD-10-CM | POA: Diagnosis not present

## 2019-05-08 DIAGNOSIS — Z8249 Family history of ischemic heart disease and other diseases of the circulatory system: Secondary | ICD-10-CM | POA: Diagnosis not present

## 2019-05-08 DIAGNOSIS — Z452 Encounter for adjustment and management of vascular access device: Secondary | ICD-10-CM

## 2019-05-08 DIAGNOSIS — Z7901 Long term (current) use of anticoagulants: Secondary | ICD-10-CM | POA: Diagnosis not present

## 2019-05-08 DIAGNOSIS — E875 Hyperkalemia: Secondary | ICD-10-CM | POA: Diagnosis not present

## 2019-05-08 DIAGNOSIS — K5900 Constipation, unspecified: Secondary | ICD-10-CM | POA: Diagnosis not present

## 2019-05-08 DIAGNOSIS — J690 Pneumonitis due to inhalation of food and vomit: Secondary | ICD-10-CM | POA: Diagnosis not present

## 2019-05-08 DIAGNOSIS — E669 Obesity, unspecified: Secondary | ICD-10-CM | POA: Diagnosis present

## 2019-05-08 DIAGNOSIS — E11649 Type 2 diabetes mellitus with hypoglycemia without coma: Secondary | ICD-10-CM | POA: Diagnosis not present

## 2019-05-08 DIAGNOSIS — R680 Hypothermia, not associated with low environmental temperature: Secondary | ICD-10-CM | POA: Diagnosis not present

## 2019-05-08 DIAGNOSIS — R140 Abdominal distension (gaseous): Secondary | ICD-10-CM

## 2019-05-08 DIAGNOSIS — E876 Hypokalemia: Secondary | ICD-10-CM | POA: Diagnosis not present

## 2019-05-08 DIAGNOSIS — Z4659 Encounter for fitting and adjustment of other gastrointestinal appliance and device: Secondary | ICD-10-CM

## 2019-05-08 DIAGNOSIS — J189 Pneumonia, unspecified organism: Secondary | ICD-10-CM

## 2019-05-08 DIAGNOSIS — I495 Sick sinus syndrome: Secondary | ICD-10-CM | POA: Diagnosis present

## 2019-05-08 HISTORY — DX: Unspecified atrial fibrillation: I48.91

## 2019-05-08 LAB — CBC
HCT: 46.7 % (ref 39.0–52.0)
Hemoglobin: 14.9 g/dL (ref 13.0–17.0)
MCH: 30.3 pg (ref 26.0–34.0)
MCHC: 31.9 g/dL (ref 30.0–36.0)
MCV: 95.1 fL (ref 80.0–100.0)
Platelets: 108 10*3/uL — ABNORMAL LOW (ref 150–400)
RBC: 4.91 MIL/uL (ref 4.22–5.81)
RDW: 13.8 % (ref 11.5–15.5)
WBC: 7.2 10*3/uL (ref 4.0–10.5)
nRBC: 0 % (ref 0.0–0.2)

## 2019-05-08 LAB — CREATININE, SERUM
Creatinine, Ser: 1.03 mg/dL (ref 0.61–1.24)
GFR calc Af Amer: >60 mL/min (ref >60)
GFR calc non Af Amer: >60 mL/min (ref >60)

## 2019-05-08 LAB — FERRITIN: Ferritin: 992 ng/mL — ABNORMAL HIGH (ref 24–336)

## 2019-05-08 LAB — POC SARS CORONAVIRUS 2 AG: SARS Coronavirus 2 Ag: POSITIVE — AB

## 2019-05-08 LAB — HIV ANTIBODY (ROUTINE TESTING W REFLEX): HIV Screen 4th Generation wRfx: NONREACTIVE

## 2019-05-08 LAB — LACTATE DEHYDROGENASE: LDH: 422 U/L — ABNORMAL HIGH (ref 98–192)

## 2019-05-08 LAB — LACTIC ACID, PLASMA
Lactic Acid, Venous: 1.5 mmol/L (ref 0.5–1.9)
Lactic Acid, Venous: 1.7 mmol/L (ref 0.5–1.9)

## 2019-05-08 LAB — GLUCOSE, CAPILLARY: Glucose-Capillary: 395 mg/dL — ABNORMAL HIGH (ref 70–99)

## 2019-05-08 LAB — C-REACTIVE PROTEIN: CRP: 6.6 mg/dL — ABNORMAL HIGH (ref <1.0)

## 2019-05-08 LAB — ABO/RH: ABO/RH(D): A POS

## 2019-05-08 LAB — PROCALCITONIN: Procalcitonin: <0.1 ng/mL

## 2019-05-08 MED ORDER — PNEUMOCOCCAL VAC POLYVALENT 25 MCG/0.5ML IJ INJ
0.5000 mL | INJECTION | INTRAMUSCULAR | Status: DC
  Filled 2019-05-08: qty 0.5

## 2019-05-08 MED ORDER — DILTIAZEM HCL ER COATED BEADS 120 MG PO CP24
240.0000 mg | ORAL_CAPSULE | Freq: Every day | ORAL | Status: DC
  Administered 2019-05-08 – 2019-05-16 (×9): 240 mg via ORAL
  Filled 2019-05-08 (×3): qty 2
  Filled 2019-05-08: qty 1
  Filled 2019-05-08 (×5): qty 2

## 2019-05-08 MED ORDER — GUAIFENESIN-DM 100-10 MG/5ML PO SYRP
10.0000 mL | ORAL_SOLUTION | ORAL | Status: DC | PRN
  Administered 2019-05-13: 10 mL via ORAL
  Filled 2019-05-08: qty 10

## 2019-05-08 MED ORDER — ONDANSETRON HCL 4 MG/2ML IJ SOLN: 4.0000 mg | Freq: Four times a day (QID) | INTRAMUSCULAR | Status: DC | PRN

## 2019-05-08 MED ORDER — SODIUM CHLORIDE 0.9 % IV SOLN: 100.0000 mg | Freq: Every day | INTRAVENOUS | Status: DC

## 2019-05-08 MED ORDER — ACETAMINOPHEN 325 MG PO TABS: 650.0000 mg | ORAL_TABLET | Freq: Four times a day (QID) | ORAL | Status: DC | PRN

## 2019-05-08 MED ORDER — SODIUM CHLORIDE 0.9 % IV SOLN
100.0000 mg | Freq: Every day | INTRAVENOUS | Status: AC
  Administered 2019-05-09 – 2019-05-11 (×3): 100 mg via INTRAVENOUS
  Filled 2019-05-08 (×4): qty 20

## 2019-05-08 MED ORDER — ATORVASTATIN CALCIUM 40 MG PO TABS
40.0000 mg | ORAL_TABLET | Freq: Every day | ORAL | Status: DC
  Administered 2019-05-08 – 2019-05-15 (×8): 40 mg via ORAL
  Filled 2019-05-08 (×8): qty 1

## 2019-05-08 MED ORDER — SODIUM CHLORIDE 0.9 % IV SOLN
200.0000 mg | Freq: Once | INTRAVENOUS | Status: AC
  Administered 2019-05-08: 200 mg via INTRAVENOUS
  Filled 2019-05-08: qty 40

## 2019-05-08 MED ORDER — ONDANSETRON HCL 4 MG PO TABS: 4.0000 mg | ORAL_TABLET | Freq: Four times a day (QID) | ORAL | Status: DC | PRN

## 2019-05-08 MED ORDER — ENOXAPARIN SODIUM 40 MG/0.4ML ~~LOC~~ SOLN: 40.0000 mg | SUBCUTANEOUS | Status: DC

## 2019-05-08 MED ORDER — PROPRANOLOL HCL ER 80 MG PO CP24
80.0000 mg | ORAL_CAPSULE | Freq: Every day | ORAL | Status: DC
  Administered 2019-05-08 – 2019-05-15 (×8): 80 mg via ORAL
  Filled 2019-05-08 (×10): qty 1

## 2019-05-08 MED ORDER — HYDROCOD POLST-CPM POLST ER 10-8 MG/5ML PO SUER: 5.0000 mL | Freq: Two times a day (BID) | ORAL | Status: DC | PRN

## 2019-05-08 MED ORDER — ASCORBIC ACID 500 MG PO TABS
500.0000 mg | ORAL_TABLET | Freq: Every day | ORAL | Status: DC
  Administered 2019-05-08 – 2019-05-16 (×9): 500 mg via ORAL
  Filled 2019-05-08 (×9): qty 1

## 2019-05-08 MED ORDER — ZINC SULFATE 220 (50 ZN) MG PO CAPS
220.0000 mg | ORAL_CAPSULE | Freq: Every day | ORAL | Status: DC
  Administered 2019-05-08 – 2019-05-22 (×15): 220 mg via ORAL
  Filled 2019-05-08 (×15): qty 1

## 2019-05-08 MED ORDER — IPRATROPIUM-ALBUTEROL 20-100 MCG/ACT IN AERS
2.0000 | INHALATION_SPRAY | Freq: Four times a day (QID) | RESPIRATORY_TRACT | Status: DC
  Administered 2019-05-08 – 2019-05-15 (×27): 2 via RESPIRATORY_TRACT
  Filled 2019-05-08 (×2): qty 4

## 2019-05-08 MED ORDER — SODIUM CHLORIDE 0.9 % IV BOLUS
1000.0000 mL | Freq: Once | INTRAVENOUS | Status: AC
  Administered 2019-05-08: 1000 mL via INTRAVENOUS

## 2019-05-08 MED ORDER — SODIUM CHLORIDE 0.9 % IV SOLN
200.0000 mg | Freq: Once | INTRAVENOUS | Status: DC
  Filled 2019-05-08: qty 40

## 2019-05-08 MED ORDER — TOCILIZUMAB 400 MG/20ML IV SOLN
790.0000 mg | Freq: Once | INTRAVENOUS | Status: AC
  Administered 2019-05-08: 790 mg via INTRAVENOUS
  Filled 2019-05-08: qty 39.5

## 2019-05-08 MED ORDER — SODIUM CHLORIDE 0.9 % IV SOLN
100.0000 mg | Freq: Every day | INTRAVENOUS | Status: DC
  Administered 2019-05-08: 100 mg via INTRAVENOUS
  Filled 2019-05-08: qty 20

## 2019-05-08 MED ORDER — POLYETHYLENE GLYCOL 3350 17 G PO PACK
17.0000 g | PACK | Freq: Every day | ORAL | Status: DC | PRN
  Administered 2019-05-16 – 2019-05-19 (×4): 17 g via ORAL
  Filled 2019-05-08 (×4): qty 1

## 2019-05-08 MED ORDER — INFLUENZA VAC A&B SA ADJ QUAD 0.5 ML IM PRSY
0.5000 mL | PREFILLED_SYRINGE | INTRAMUSCULAR | Status: DC
  Filled 2019-05-08: qty 0.5

## 2019-05-08 MED ORDER — DEXAMETHASONE SODIUM PHOSPHATE 10 MG/ML IJ SOLN
6.0000 mg | Freq: Two times a day (BID) | INTRAMUSCULAR | Status: DC
  Administered 2019-05-08 – 2019-05-11 (×8): 6 mg via INTRAVENOUS
  Filled 2019-05-08 (×8): qty 1

## 2019-05-08 MED ORDER — RIVAROXABAN 20 MG PO TABS
20.0000 mg | ORAL_TABLET | Freq: Every day | ORAL | Status: DC
  Administered 2019-05-08 – 2019-05-10 (×3): 20 mg via ORAL
  Filled 2019-05-08 (×4): qty 1

## 2019-05-08 NOTE — ED Notes (Signed)
Patient is resting comfortably on 8L [place]. No complaints, awaiting [facility] transport

## 2019-05-08 NOTE — H&P (Signed)
History and Physical    Max Lozano BHA:[id number] DOB: 07-20-1950 DOA: 06/01/2019  PCP: Malva Limes, MD   Patient coming from: [facility]  I have personally briefly reviewed patient's old medical records in [facility]  Chief Complaint: Shortness of breath.  HPI: Max Lozano is a 69 y.o. male with medical history significant of atrial fibrillation, hyperlipidemia, hypertension, motion sickness, history of syncope and collapse, sick sinus syndrome with Medtronic pacemaker insertion who is coming from [facility] after presenting there with progressively worse dyspnea associated with myalgia, and nonproductive cough, fever, chills, fatigue, malaise and decreased appetite for 4 days.  He states that his wife tested positive for COVID-19 on December 20 of 2020 and his initial COVID-19 test was negative.  However, he subsequently developed symptoms.  EMS was called after he became very dyspneic at home.  They describe.on arrival to the scene his O2 sat was 80% and then it increased to 92% on 6 LPM via [place].  He denies headache, chest pain, palpitations, dizziness, diaphoresis, PND, orthopnea or recent pitting edema of the lower extremities.  No abdominal pain, nausea, emesis, diarrhea, constipation, melena or hematochezia.  Denies dysuria, frequency hematuria.  No polyuria, polydipsia, polyphagia or blurred vision.  ED Course: initial vital signs on arrival to the emergency department where temperature 99 F, pulse 110, tachypneic in the 20s and 30s, BP 140/61 mmHg and O2 sat 90% on nasal cannula oxygen.  His CBC was normal except for platelets of 97.  Lactic acid was normal x2.  BMP shows a glucose of 229 and calcium of 8.6 mg/dL.  Calcium normalizes when corrected to albumin.  AST was 44 units/L, all other CMP values are within expected range.  D-dimer was 7.7 mcg/mL.  Triglycerides were normal.  Fibrinogen 734 mg/dL.  Ferritin 992 ng/mL.  LDH was 422 units/L.  SARS coronavirus 2 antigen was positive.  His  chest radiograph shows vague groundglass opacity in the mid lung zones.  Review of Systems: As per HPI otherwise 10 point review of systems negative.   Past Medical History:  Diagnosis Date  . Atrial fibrillation (HCC)   . Hyperlipidemia   . Hypertension   . Motion sickness    deep sea boats  . PPM-Medtronic 10/11/2009   Qualifier: Diagnosis of  By: Ladona Ridgel, MD, Jerrell Mylar   . Sick sinus syndrome [facility])    s/p pacer  . Syncope and collapse   . Wears dentures    full upper and lower    Past Surgical History:  Procedure Laterality Date  . COLONOSCOPY WITH PROPOFOL N/A 03/25/2015   Procedure: COLONOSCOPY WITH PROPOFOL;  Surgeon: Midge Minium, MD;  Location: [facility];  Service: Endoscopy;  Laterality: N/A;  ULCER ASCENDING COLON BX.   [name] / REPLACE / REMOVE PACEMAKER  09/04/07   Medtronic     reports that he has never smoked. He has never used smokeless tobacco. He reports that he does not drink alcohol or use drugs.  Allergies  Allergen Reactions  . Atenolol Rash    Family History  Problem Relation Age of Onset  . Diabetes Mother   . Heart attack Father    Prior to Admission medications   Medication Sig Start Date End Date Taking? Authorizing Provider  atorvastatin (LIPITOR) 10 MG tablet TAKE 1 TABLET(10 MG) BY MOUTH DAILY 05/08/18  Yes Gollan, Tollie Pizza, MD  diltiazem (CARDIZEM CD) 240 MG 24 hr capsule Take 1 capsule (240 mg total) by mouth daily.  01/01/19  Yes Gollan, Tollie Pizza, MD  fish oil-omega-3 fatty acids 1000 MG capsule Take 1 g by mouth 2 (two) times daily.     Yes [provider]  propranolol ER (INDERAL LA) 80 MG 24 hr capsule Take 1 capsule (80 mg total) by mouth daily. 01/01/19  Yes Gollan, Tollie Pizza, MD  XARELTO 20 MG TABS tablet TAKE 1 TABLET BY MOUTH ONCE DAILY WITH SUPPER 12/23/18  Yes Gollan, Tollie Pizza, MD  lisinopril (ZESTRIL) 20 MG tablet Take 1 tablet (20 mg total) by mouth daily. 12/22/18 03/22/19  Antonieta Iba, MD     Physical Exam: Vitals:   05/09/2019 1620 05/19/2019 1641 05/25/2019 1654 05/24/2019 1920  BP:  (!) 160/71  (!) 135/103  Pulse: 60 61  64  Resp: 20 (!) 31  (!) 23  Temp:    98.1 F (36.7 C)  TempSrc:    Oral  SpO2: 92% (!) 89%  93%  Height:   5\' 10"  (1.778 m)     Constitutional: NAD, calm, comfortable Eyes: PERRL, lids and conjunctivae normal ENMT: Mucous membranes are moist. Posterior pharynx clear of any exudate or lesions. Neck: normal, supple, no masses, no thyromegaly Respiratory: Normal respiratory effort. Diffuse crackles, but good air movement.  No accessory muscle use.  Cardiovascular: Regular rate and rhythm, no murmurs / rubs / gallops. No extremity edema. 2+ pedal pulses. No carotid bruits.  Abdomen: Nondistended, obese.  Soft, no tenderness, no masses palpated. No hepatosplenomegaly. Bowel sounds positive.  Musculoskeletal: no clubbing / cyanosis. Good ROM, no contractures. Normal muscle tone.  Skin: no rashes, lesions, ulcers on limited dermatological examination. Neurologic: CN 2-12 grossly intact. Sensation intact, DTR normal. Strength 5/5 in all 4.  Psychiatric: Normal judgment and insight. Alert and oriented x 3. Normal mood.   Labs on Admission: I have personally reviewed following labs and imaging studies  CBC: Recent Labs  Lab 05/07/19 2239 05/14/2019 1420  WBC 7.0 7.2  NEUTROABS 4.5  --   HGB 14.8 14.9  HCT 42.7 46.7  MCV 88.8 95.1  PLT 97* 108*   Basic Metabolic Panel: Recent Labs  Lab 05/07/19 2239 05/25/2019 1420  NA 135  --   K 4.1  --   CL 102  --   CO2 23  --   GLUCOSE 229*  --   BUN 21  --   CREATININE 1.03 1.03  CALCIUM 8.6*  --    GFR: Estimated Creatinine Clearance: 83 mL/min (by C-G formula based on SCr of 1.03 mg/dL). Liver Function Tests: Recent Labs  Lab 05/07/19 2239  AST 44*  ALT 28  ALKPHOS 64  BILITOT 1.0  PROT 7.5  ALBUMIN 3.7   No results for input(s): LIPASE, AMYLASE in the last 168 hours. No results for input(s):  AMMONIA in the last 168 hours. Coagulation Profile: No results for input(s): INR, PROTIME in the last 168 hours. Cardiac Enzymes: No results for input(s): CKTOTAL, CKMB, CKMBINDEX, TROPONINI in the last 168 hours. BNP (last 3 results) No results for input(s): PROBNP in the last 8760 hours. HbA1C: No results for input(s): HGBA1C in the last 72 hours. CBG: No results for input(s): GLUCAP in the last 168 hours. Lipid Profile: Recent Labs    05/07/19 2239  TRIG 97   Thyroid Function Tests: No results for input(s): TSH, T4TOTAL, FREET4, T3FREE, THYROIDAB in the last 72 hours. Anemia Panel: Recent Labs    05/07/19 2239  FERRITIN 992*   Urine analysis: No results found for: COLORURINE,  APPEARANCEUR, LABSPEC, PHURINE, GLUCOSEU, HGBUR, BILIRUBINUR, KETONESUR, PROTEINUR, UROBILINOGEN, NITRITE, LEUKOCYTESUR  Radiological Exams on Admission: DG Chest Portable 1 View  Result Date: 05/07/2019 CLINICAL DATA:  Shortness of breath EXAM: PORTABLE CHEST 1 VIEW COMPARISON:  10/15/2014 FINDINGS: Left-sided pacing device with leads over the right atrium and right ventricle. Vague ground-glass opacity in the mid lung zones. Enlarged cardiomediastinal silhouette. No pleural effusion or pneumothorax. IMPRESSION: Suspect vague ground-glass opacities in the mid lung zones as may be seen with pneumonia, possible atypical or viral pneumonia. Electronically Signed   By: Donavan Foil M.D.   On: 05/07/2019 23:22    EKG: Independently reviewed.  Vent. rate 65 BPM PR interval 184 ms QRS duration 88 ms QT/QTc 400/416 ms P-R-T axes -15 23 -3 Atrial-paced rhythm Abnormal ECG When compared with ECG of 16-Aug-2014. Electronic atrial pacemaker has replaced Sinus rhythm Inverted T waves have replaced nonspecific T wave abnormality in Inferior leads  Assessment/Plan Principal Problem:   Pneumonia due to COVID-19 virus   Acute respiratory failure with hypoxia (HCC)  Admit to progressive  unit/inpatient. Continue supplemental oxygen. Continue bronchodilators. Incentive spirometry. Prone positioning as tolerated. Already received tocilizumab. Continue dexamethasone 6 mg IVP every 12 hours. Continue remdesivir per pharmacy. Follow-up CBC, CMP and inflammatory markers.  Active Problems:   HYPERTENSION, BENIGN ESSENTIAL Continue Cardizem CD 240 mg p.o. daily. Continue Inderal LA 80 mg p.o. daily. Monitor blood pressure and heart rate.    Permanent atrial fibrillation (HCC) Continue diltiazem, propranolol and rivaroxaban    Sinoatrial node dysfunction (HCC) Status post pacemaker placement. Continue cardiac telemetry.    Hyperlipidemia On atorvastatin.      DVT prophylaxis: On Xarelto. Code Status: Full code. Family Communication: Disposition Plan: Admit for COVID-19 pneumonia. Consults called: Admission status: Inpatient/progressive.   Reubin Milan MD [facility]  If 7PM-7AM, please contact night-coverage [email or web address]  06/07/2019, 7:40 PM   This document was created using Dragon voice recognition software and may contain some unintended transcription errors.

## 2019-05-08 NOTE — ED Notes (Signed)
Unable to get answer when calling to give report. [facility] notified.

## 2019-05-08 NOTE — ED Notes (Signed)
Pt asleep when entered room. Pt saturation 93% on 10L humidified HFNC. Pt updated on transfer.

## 2019-05-08 NOTE — ED Notes (Signed)
emtala reviewed by this RN 

## 2019-05-08 NOTE — ED Notes (Signed)
EMS here to transport patient

## 2019-05-08 NOTE — Progress Notes (Signed)
[facility] PROGRESS NOTE    Progress Note  Max Lozano  GUY:[id number] DOB: August 14, 1950 DOA: 2019/05/14 PCP: Malva Limes, MD     Brief Narrative:   Max Lozano is an 69 y.o. male past medical history no nodal dysfunction chronic atrial fibrillation on Xarelto syncope and hypertension with morbid obesity comes into the hospital for 4 days of nonproductive cough and fevers tested positive for COVID-19 on April 25, 2020 2 [facility] ED was found to be satting in the 80s placed on 6 L chest x-ray showed bilateral infiltrates and SARS-CoV-2 was positive  Assessment/Plan:   Acute respiratory failure with hypoxia secondary to pneumonia due to COVID-19: Oxygen requirements have worsened and he is gone from 6 L to 10 L. He was started empirically on IV remdesivir and steroids Try to keep strict I's and O's and daily weights. Try to keep the patient peripherally 16 hours a day if not prone out of bed to chair, will have physical therapy evaluate him. I discussed with him the risk and benefits of being on Actemra and he would like to proceed.The treatment plan and use of medications (actemra)and known side effects were discussed with patient/family, they were clearly explained that there is no proven definitive treatment for COVID-19 infection, any medications used here are based on published clinical articles/anecdotal data which are not peer-reviewed or randomized control trials.  Complete risks and long-term side effects are unknown, however in the best clinical judgment they seem to be of some clinical benefit rather than medical risks.  Patient/family agree with the treatment plan and want to receive the given medications.  HYPERTENSION, BENIGN ESSENTIAL Continue current regimen blood pressure is fairly controlled we will resume diltiazem, propranolol and lisinopril.  Permanent atrial fibrillation (HCC)/Sinoatrial node dysfunction (HCC) Rate control on diltiazem and metoprolol,  continue Xarelto he is currently asymptomatic.  DVT prophylaxis: lovenox Family Communication:none Disposition Plan/Barrier to D/C: unable to determine Code Status:     Code Status Orders  (From admission, onward)         Start     Ordered   05-14-19 1339  Full code  Continuous     05/14/19 1341        Code Status History    This patient has a current code status but no historical code status.   Advance Care Planning Activity        IV Access:    Peripheral IV   Procedures and diagnostic studies:   DG Chest Portable 1 View  Result Date: 05/07/2019 CLINICAL DATA:  Shortness of breath EXAM: PORTABLE CHEST 1 VIEW COMPARISON:  10/15/2014 FINDINGS: Left-sided pacing device with leads over the right atrium and right ventricle. Vague ground-glass opacity in the mid lung zones. Enlarged cardiomediastinal silhouette. No pleural effusion or pneumothorax. IMPRESSION: Suspect vague ground-glass opacities in the mid lung zones as may be seen with pneumonia, possible atypical or viral pneumonia. Electronically Signed   By: Jasmine Pang M.D.   On: 05/07/2019 23:22     Medical Consultants:    None.  Anti-Infectives:   IV remdesivir  Subjective:    Max Lozano relates his breathing is not better than when he came in he feels slightly more short of breath.  Objective:    Vitals:   2019-05-14 1155 05-14-2019 1202  BP: (!) 148/91   Pulse: 80 62  Resp: (!) 24 14  Temp:  (!) 96.6 F (35.9 C)  TempSrc:  Axillary  SpO2:  (!) 88%  SpO2: (!) 88 %  No intake or output data in the 24 hours ending 05/15/2019 1412 There were no vitals filed for this visit.  Exam: General exam: In no acute distress. Respiratory system: Good air movement and diffuse crackles bilaterally Cardiovascular system: S1 & S2 heard, RRR. No JVD. Gastrointestinal system: Abdomen is nondistended, soft and nontender.  Central nervous system: Alert and oriented. No focal neurological  deficits. Extremities: No pedal edema. Skin: No rashes, lesions or ulcers Psychiatry: Judgement and insight appear normal. Mood & affect appropriate.    Data Reviewed:    Labs: Basic Metabolic Panel: Recent Labs  Lab 05/07/19 2239  NA 135  K 4.1  CL 102  CO2 23  GLUCOSE 229*  BUN 21  CREATININE 1.03  CALCIUM 8.6*   GFR Estimated Creatinine Clearance: 83 mL/min (by C-G formula based on SCr of 1.03 mg/dL). Liver Function Tests: Recent Labs  Lab 05/07/19 2239  AST 44*  ALT 28  ALKPHOS 64  BILITOT 1.0  PROT 7.5  ALBUMIN 3.7   No results for input(s): LIPASE, AMYLASE in the last 168 hours. No results for input(s): AMMONIA in the last 168 hours. Coagulation profile No results for input(s): INR, PROTIME in the last 168 hours. COVID-19 Labs  Recent Labs    05/07/19 2239  FERRITIN 992*  LDH 422*  CRP 6.6*    Lab Results  Component Value Date   SARSCOV2NAA Not Detected 12/08/2018    CBC: Recent Labs  Lab 05/07/19 2239  WBC 7.0  NEUTROABS 4.5  HGB 14.8  HCT 42.7  MCV 88.8  PLT 97*   Cardiac Enzymes: No results for input(s): CKTOTAL, CKMB, CKMBINDEX, TROPONINI in the last 168 hours. BNP (last 3 results) No results for input(s): PROBNP in the last 8760 hours. CBG: No results for input(s): GLUCAP in the last 168 hours. D-Dimer: No results for input(s): DDIMER in the last 72 hours. Hgb A1c: No results for input(s): HGBA1C in the last 72 hours. Lipid Profile: Recent Labs    05/07/19 2239  TRIG 97   Thyroid function studies: No results for input(s): TSH, T4TOTAL, T3FREE, THYROIDAB in the last 72 hours.  Invalid input(s): FREET3 Anemia work up: Recent Labs    05/07/19 2239  FERRITIN 992*   Sepsis Labs: Recent Labs  Lab 05/07/19 2239 05/07/19 2333  PROCALCITON <0.10  --   WBC 7.0  --   LATICACIDVEN 1.7 1.5   Microbiology Recent Results (from the past 240 hour(s))  Blood Culture (routine x 2)     Status: None (Preliminary result)    Collection Time: 05/07/19 11:33 PM   Specimen: BLOOD  Result Value Ref Range Status   Specimen Description BLOOD RIGHT ANTECUBITAL  Final   Special Requests   Final    BOTTLES DRAWN AEROBIC AND ANAEROBIC Blood Culture adequate volume   Culture   Final    NO GROWTH < 12 HOURS Performed at [facility], [address] [place], [place] [address]    Report Status PENDING  Incomplete  Blood Culture (routine x 2)     Status: None (Preliminary result)   Collection Time: 05/07/19 11:33 PM   Specimen: BLOOD  Result Value Ref Range Status   Specimen Description BLOOD LEFT ANTECUBITAL  Final   Special Requests   Final    BOTTLES DRAWN AEROBIC AND ANAEROBIC Blood Culture adequate volume   Culture   Final    NO GROWTH < 12 HOURS Performed at [facility], [address] [place], [place] [address]    Report Status PENDING  Incomplete     Medications:   . atorvastatin  40 mg Oral q1800  . dexamethasone (DECADRON) injection  6 mg Intravenous Q12H  . diltiazem  240 mg Oral Daily  . enoxaparin (LOVENOX) injection  40 mg Subcutaneous Q24H  . propranolol ER  80 mg Oral Daily  . rivaroxaban  20 mg Oral Daily   Continuous Infusions: . [START ON 05/09/2019] remdesivir 100 mg in NS 100 mL    . tocilizumab (ACTEMRA) IV       LOS: 0 days   Marinda Elk  [facility]  May 21, 2019, 2:12 PM

## 2019-05-08 NOTE — ED Notes (Signed)
Called [phone] to give report, no answer. [facility] on the way to transport patient to [facility]. Will call back to give report.

## 2019-05-08 NOTE — ED Notes (Signed)
Patient denies being short of breath or gasping for air but is significantly tachypneic. This RN counted breaths, approx 26-30x min. Increased patient to 10L on HFNC, patient now breathing 20x min

## 2019-05-08 NOTE — Progress Notes (Signed)
[facility] night shift transfer acceptance note.   Per Dr. Owens Shark:  HPI Max Lozano is a 69 y.o. male with PMX of Sick Sinus SX, atrial fibrillation on Xarelto, OSA, Syncoe, HTN, hyperlipidemia, obesity, presents to the emergency department secondary to generalized malaise myalgia dyspnea, nonproductive cough and fever x4 days.  States that his wife tested positive for COVID-19 on April 26, 2019 and that he was subsequently tested but it was negative.  EMS states on their arrival the patient's oxygen saturation was 80% with 6 L oxygen via nasal cannula patient's current oxygen saturation is 92%.  Enc Vitals Group     BP 05/07/19 2224 (!) 141/61     Pulse Rate 05/07/19 2224 (!) 110     Resp --      Temp 05/07/19 2224 99 F (37.2 C)     Temp Source 05/07/19 2224 Oral     SpO2 05/07/19 2224 90 %     Weight 05/07/19 2225 98.4 kg (217 lb)     Height 05/07/19 2225 1.778 m (5\' 10" )     Component Value Units  POC SARS Coronavirus 2 Ag [[id number]] (Abnormal)   Collected: 05/25/2019 0114   Updated: 06/03/2019 0115    SARS Coronavirus 2 Ag POSITIVEAbnormal   Procalcitonin [[id number]]   Collected: 05/07/19 2239   Updated: 05/10/2019 0054    Procalcitonin <0.10 ng/mL  SARS CORONAVIRUS 2 (TAT 6-24 HRS) Nasopharyngeal Nasopharyngeal Swab [[id number]]   Collected: 05/07/19 2333   Updated: 05/11/2019 0047   Specimen Source: Nasopharyngeal Swab   Lactate dehydrogenase [[id number]] (Abnormal)   Collected: 05/07/19 2239   Updated: 05/20/2019 0028   Specimen Type: Blood    LDH 422High  U/L  Ferritin [[id number]] (Abnormal)   Collected: 05/07/19 2239   Updated: 06/01/2019 0028   Specimen Type: Blood   Specimen Source: Vein    Ferritin 992High  ng/mL  Lactic acid, plasma [[id number]]   Collected: 05/07/19 2239   Updated: 05/29/2019 0006   Specimen Type: Blood    Lactic Acid, Venous 1.7 mmol/L  Lactic acid, plasma [[id number]]   Collected: 05/07/19 2333   Updated: 05/23/2019 0004   Specimen Type: Blood    Lactic Acid, Venous 1.5 mmol/L  Blood Culture (routine x 2) [[id number]]   Collected: 05/07/19 2333   Updated: 05/07/19 2355   Specimen Type: Blood   Blood Culture (routine x 2) [[id number]]   Collected: 05/07/19 2333   Updated: 05/07/19 2354   Specimen Type: Blood   Fibrinogen [[id number]] (Abnormal)   Collected: 05/07/19 2239   Updated: 05/07/19 2352   Specimen Type: Blood   Specimen Source: Vein    Fibrinogen 734High  mg/dL  Triglycerides [[id number]]   Collected: 05/07/19 2239   Updated: 05/07/19 2349   Specimen Type: Blood    Triglycerides 97 mg/dL  C-reactive protein [[id number]]   Collected: 05/07/19 2239   Updated: 05/07/19 2331   Specimen Type: Blood   Fibrin derivatives D-Dimer ([place] only) [[id number]] (Abnormal)   Collected: 05/07/19 2239   Updated: 05/07/19 2322   Specimen Source: Vein    Fibrin derivatives D-dimer ([place]) 772.25High  ng/mL (FEU)  Comprehensive metabolic panel [[id number]] (Abnormal)   Collected: 05/07/19 2239   Updated: 05/07/19 2320   Specimen Type: Blood   Specimen Source: Vein    Sodium 135 mmol/L   Potassium 4.1 mmol/L   Chloride 102 mmol/L   CO2 23 mmol/L   Glucose, Bld 229High  mg/dL   BUN 21 mg/dL   Creatinine, Ser 1.03 mg/dL  Calcium 8.6Low  mg/dL   Total Protein 7.5 g/dL   Albumin 3.7 g/dL   AST 33ASNK  U/L   ALT 28 U/L   Alkaline Phosphatase 64 U/L   Total Bilirubin 1.0 mg/dL   GFR calc non Af Amer >60 mL/min   GFR calc Af Amer >60 mL/min   Anion gap 10  Troponin I (High Sensitivity) [[id number]]   Collected: 05/07/19 2239   Updated: 05/07/19 2320    Troponin I (High Sensitivity) 15 ng/L  CBC with Differential [[id number]] (Abnormal)   Collected: 05/07/19 2239   Updated: 05/07/19 2305   Specimen Type: Blood   Specimen Source: Vein    WBC 7.0 K/uL   RBC 4.81 MIL/uL   Hemoglobin 14.8 g/dL   HCT 73.5 %   MCV 32.9 fL   MCH 30.8 pg   MCHC 34.7 g/dL   RDW 92.4 %   Platelets 97Low  K/uL   nRBC 0.0 %   Neutrophils Relative %  64 %   Neutro Abs 4.5 K/uL   Lymphocytes Relative 28 %   Lymphs Abs 1.9 K/uL   Monocytes Relative 8 %   Monocytes Absolute 0.5 K/uL   Eosinophils Relative 0 %   Eosinophils Absolute 0.0 K/uL   Basophils Relative 0 %   Basophils Absolute 0.0 K/uL   Immature Granulocytes 0 %   Abs Immature Granulocytes 0.03 K/uL      Study Result  CLINICAL DATA:  Shortness of breath  EXAM: PORTABLE CHEST 1 VIEW  COMPARISON:  10/15/2014  FINDINGS: Left-sided pacing device with leads over the right atrium and right ventricle. Vague ground-glass opacity in the mid lung zones. Enlarged cardiomediastinal silhouette. No pleural effusion or pneumothorax.  IMPRESSION: Suspect vague ground-glass opacities in the mid lung zones as may be seen with pneumonia, possible atypical or viral pneumonia.    DX:  Acute respiratory failure. COVID-19 pneumonia. Accepted to telemetry bed at [facility] once bed and transportation available. Received dexamethasone, remdesivir, ceftriaxone and azithromycin.  Evaluation for Tocilizumab infusion criteria once CRP resulted. See Dr. Theora Gianotti note for further details.  Sanda Klein, MD

## 2019-05-08 NOTE — ED Notes (Signed)
Spoke with respiratory about patient's sats and tachypnea. RT OK with patient's sats, patient states he feels "okay". Will continue to monitor

## 2019-05-08 NOTE — Evaluation (Signed)
Physical Therapy Evaluation Patient Details Name: Max Lozano MRN: [id number] DOB: 23-Jul-1950 Today's Date: 05/11/2019   History of Present Illness  69 y.o. male past medical history no nodal dysfunction chronic atrial fibrillation on Xarelto syncope and hypertension with morbid obesity comes into the hospital for 4 days of nonproductive cough and fevers tested positive for COVID-19 on April 25, 2020 [name] ED was found to be satting in the 80s placed on 6 L chest x-ray showed bilateral infiltrates and SARS-CoV-2 was positive  Clinical Impression   Pt admitted with above diagnosis. PTA was living home with spouse and son and was very independent and working. Pt currently with functional limitations due to the deficits listed below (see PT Problem List). This aftrenoon pt is doing well functionally but he is now needing 15L/min via HFNC. At rest he is able to maintain sats in 90s but with ambulation desats to min 79% with pursed lip breathing is able to recover within 1-2 mins of sitting down. Pt was able to ambulate approx 62ft x 2 with seated rest between needing min guard assist each time.  Pt will benefit from skilled PT to increase his activity tolerance, independence and safety with mobility to allow discharge to the venue listed below.       Follow Up Recommendations No PT follow up    Equipment Recommendations  None recommended by PT    Recommendations for Other Services OT consult     Precautions / Restrictions Precautions Precautions: Other (comment)(desats with activity) Restrictions Weight Bearing Restrictions: No      Mobility  Bed Mobility Overal bed mobility: Needs Assistance Bed Mobility: Supine to Sit;Sit to Supine;Rolling Rolling: Supervision   Supine to sit: Supervision Sit to supine: Supervision      Transfers Overall transfer level: Needs assistance   Transfers: Sit to/from Stand;Stand Pivot Transfers Sit to Stand: Min guard Stand pivot  transfers: Min guard          Ambulation/Gait Ambulation/Gait assistance: Min assist Gait Distance (Feet): 88 Feet Assistive device: None Gait Pattern/deviations: Step-through pattern Gait velocity: fair   General Gait Details: ambulated 2 x 42ft with min guard assist on 15L/min via HFNC desat to min 79%  Stairs            Wheelchair Mobility    Modified Rankin (Stroke Patients Only)       Balance Overall balance assessment: Mild deficits observed, not formally tested                                           Pertinent Vitals/Pain Pain Assessment: No/denies pain    Home Living Family/patient expects to be discharged to:: Private residence Living Arrangements: Children;Spouse/significant other Available Help at Discharge: Family Type of Home: Mobile home Home Access: Ramped entrance     Home Layout: One level Home Equipment: Environmental consultant - 2 wheels;Wheelchair - manual      Prior Function Level of Independence: Independent         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, newspaper        Extremity/Trunk Assessment   Upper Extremity Assessment Upper Extremity Assessment: Overall WFL for tasks assessed    Lower Extremity Assessment Lower Extremity Assessment: Overall WFL for tasks assessed    Cervical / Trunk Assessment Cervical / Trunk Assessment: Normal  Communication   Communication: No difficulties  Cognition Arousal/Alertness:  Awake/alert Behavior During Therapy: WFL for tasks assessed/performed Overall Cognitive Status: No family/caregiver present to determine baseline cognitive functioning                                        General Comments      Exercises Other Exercises Other Exercises: reinforced use of incentive spirometer able to pull Other Exercises: flutter valve use x 5   Assessment/Plan    PT Assessment Patient needs continued PT services  PT Problem List Decreased activity  tolerance;Decreased balance;Decreased coordination       PT Treatment Interventions Gait training;Functional mobility training;Therapeutic activities;Therapeutic exercise;Balance training;Neuromuscular re-education;Patient/family education    PT Goals (Current goals can be found in the Care Plan section)  Acute Rehab PT Goals Patient Stated Goal: to get home PT Goal Formulation: With patient Time For Goal Achievement: May 30, 2019 Potential to Achieve Goals: Good    Frequency Min 3X/week   Barriers to discharge        Co-evaluation               AM-PAC PT "6 Clicks" Mobility  Outcome Measure Help needed turning from your back to your side while in a flat bed without using bedrails?: None Help needed moving from lying on your back to sitting on the side of a flat bed without using bedrails?: None Help needed moving to and from a bed to a chair (including a wheelchair)?: A Little Help needed standing up from a chair using your arms (e.g., wheelchair or bedside chair)?: A Little Help needed to walk in hospital room?: A Little Help needed climbing 3-5 steps with a railing? : A Lot 6 Click Score: 19    End of Session Equipment Utilized During Treatment: Oxygen Activity Tolerance: Treatment limited secondary to medical complications (Comment);Patient limited by fatigue Patient left: in chair;with call bell/phone within reach Nurse Communication: Mobility status PT Visit Diagnosis: Other abnormalities of gait and mobility (R26.89)    Time: 7829-5621 PT Time Calculation (min) (ACUTE ONLY): 22 min   Charges:   PT Evaluation $PT Eval Moderate Complexity: 1 Mod PT Treatments $Gait Training: 8-22 mins        Drema Pry, PT   Freddi Starr 05/10/2019, 4:37 PM

## 2019-05-08 NOTE — ED Notes (Signed)
PATIENT GIVES VERBAL CONSENT FOR TRANSFER TO [facility]

## 2019-05-09 DIAGNOSIS — I495 Sick sinus syndrome: Secondary | ICD-10-CM

## 2019-05-09 DIAGNOSIS — I10 Essential (primary) hypertension: Secondary | ICD-10-CM

## 2019-05-09 DIAGNOSIS — I4821 Permanent atrial fibrillation: Secondary | ICD-10-CM

## 2019-05-09 LAB — COMPREHENSIVE METABOLIC PANEL
ALT: 28 U/L (ref 0–44)
AST: 36 U/L (ref 15–41)
Albumin: 3.2 g/dL — ABNORMAL LOW (ref 3.5–5.0)
Alkaline Phosphatase: 65 U/L (ref 38–126)
Anion gap: 10 (ref 5–15)
BUN: 31 mg/dL — ABNORMAL HIGH (ref 8–23)
CO2: 25 mmol/L (ref 22–32)
Calcium: 9.1 mg/dL (ref 8.9–10.3)
Chloride: 105 mmol/L (ref 98–111)
Creatinine, Ser: 1.02 mg/dL (ref 0.61–1.24)
GFR calc Af Amer: >60 mL/min (ref >60)
GFR calc non Af Amer: >60 mL/min (ref >60)
Glucose, Bld: 337 mg/dL — ABNORMAL HIGH (ref 70–99)
Potassium: 5.2 mmol/L — ABNORMAL HIGH (ref 3.5–5.1)
Sodium: 140 mmol/L (ref 135–145)
Total Bilirubin: 0.7 mg/dL (ref 0.3–1.2)
Total Protein: 6.7 g/dL (ref 6.5–8.1)

## 2019-05-09 LAB — CBC WITH DIFFERENTIAL/PLATELET
Abs Immature Granulocytes: 0.04 10*3/uL (ref 0.00–0.07)
Basophils Absolute: 0 10*3/uL (ref 0.0–0.1)
Basophils Relative: 0 %
Eosinophils Absolute: 0 10*3/uL (ref 0.0–0.5)
Eosinophils Relative: 0 %
HCT: 43.9 % (ref 39.0–52.0)
Hemoglobin: 14.1 g/dL (ref 13.0–17.0)
Immature Granulocytes: 1 %
Lymphocytes Relative: 35 %
Lymphs Abs: 2.7 10*3/uL (ref 0.7–4.0)
MCH: 30.4 pg (ref 26.0–34.0)
MCHC: 32.1 g/dL (ref 30.0–36.0)
MCV: 94.6 fL (ref 80.0–100.0)
Monocytes Absolute: 0.5 10*3/uL (ref 0.1–1.0)
Monocytes Relative: 7 %
Neutro Abs: 4.5 10*3/uL (ref 1.7–7.7)
Neutrophils Relative %: 57 %
Platelets: 107 10*3/uL — ABNORMAL LOW (ref 150–400)
RBC: 4.64 MIL/uL (ref 4.22–5.81)
RDW: 13.8 % (ref 11.5–15.5)
WBC: 7.8 10*3/uL (ref 4.0–10.5)
nRBC: 0 % (ref 0.0–0.2)

## 2019-05-09 LAB — HEMOGLOBIN A1C
Hgb A1c MFr Bld: 8.5 % — ABNORMAL HIGH (ref 4.8–5.6)
Mean Plasma Glucose: 197.25 mg/dL

## 2019-05-09 LAB — GLUCOSE, CAPILLARY
Glucose-Capillary: 282 mg/dL — ABNORMAL HIGH (ref 70–99)
Glucose-Capillary: 346 mg/dL — ABNORMAL HIGH (ref 70–99)
Glucose-Capillary: 327 mg/dL — ABNORMAL HIGH (ref 70–99)
Glucose-Capillary: 350 mg/dL — ABNORMAL HIGH (ref 70–99)

## 2019-05-09 MED ORDER — INSULIN ASPART 100 UNIT/ML ~~LOC~~ SOLN
6.0000 [IU] | Freq: Three times a day (TID) | SUBCUTANEOUS | Status: DC
  Administered 2019-05-09 – 2019-05-11 (×6): 6 [IU] via SUBCUTANEOUS

## 2019-05-09 MED ORDER — INSULIN DETEMIR 100 UNIT/ML ~~LOC~~ SOLN
20.0000 [IU] | Freq: Every day | SUBCUTANEOUS | Status: DC
  Administered 2019-05-09: 20 [IU] via SUBCUTANEOUS
  Filled 2019-05-09 (×2): qty 0.2

## 2019-05-09 MED ORDER — TOCILIZUMAB 400 MG/20ML IV SOLN
760.0000 mg | Freq: Once | INTRAVENOUS | Status: AC
  Administered 2019-05-09: 760 mg via INTRAVENOUS
  Filled 2019-05-09: qty 38

## 2019-05-09 MED ORDER — INSULIN ASPART 100 UNIT/ML ~~LOC~~ SOLN
0.0000 [IU] | Freq: Three times a day (TID) | SUBCUTANEOUS | Status: DC
  Administered 2019-05-09 – 2019-05-10 (×3): 15 [IU] via SUBCUTANEOUS
  Administered 2019-05-10 (×2): 11 [IU] via SUBCUTANEOUS
  Administered 2019-05-11 (×2): 7 [IU] via SUBCUTANEOUS
  Administered 2019-05-11: 11 [IU] via SUBCUTANEOUS
  Administered 2019-05-12 (×2): 3 [IU] via SUBCUTANEOUS
  Administered 2019-05-13 (×2): 15 [IU] via SUBCUTANEOUS

## 2019-05-09 MED ORDER — INSULIN ASPART 100 UNIT/ML ~~LOC~~ SOLN
0.0000 [IU] | Freq: Every day | SUBCUTANEOUS | Status: DC
  Administered 2019-05-09: 4 [IU] via SUBCUTANEOUS
  Administered 2019-05-10 – 2019-05-11 (×2): 3 [IU] via SUBCUTANEOUS
  Administered 2019-05-13: 2 [IU] via SUBCUTANEOUS

## 2019-05-09 NOTE — Plan of Care (Addendum)
Patient up to chair majority of the day. Currently on 15L HFNC with sats dropping to 80's. Patient educated on the benefits of proning, use of IS and flutter valve. No s/s of pain or distress and no use of accessory muscles. Patient stated he updated his family. Will continue to monitor for remainder of shift.    Problem: Education: Goal: Knowledge of risk factors and measures for prevention of condition will improve 05/09/2019 1803 by Winnifred Friar, RN Outcome: Progressing 05/09/2019 1803 by Winnifred Friar, RN Outcome: Progressing   Problem: Coping: Goal: Psychosocial and spiritual needs will be supported 05/09/2019 1803 by Winnifred Friar, RN Outcome: Progressing 05/09/2019 1803 by Winnifred Friar, RN Outcome: Progressing   Problem: Respiratory: Goal: Will maintain a patent airway 05/09/2019 1803 by Winnifred Friar, RN Outcome: Progressing 05/09/2019 1803 by Winnifred Friar, RN Outcome: Progressing Goal: Complications related to the disease process, condition or treatment will be avoided or minimized 05/09/2019 1803 by Winnifred Friar, RN Outcome: Progressing 05/09/2019 1803 by Winnifred Friar, RN Outcome: Progressing   Problem: Education: Goal: Knowledge of General Education information will improve Description: Including pain rating scale, medication(s)/side effects and non-pharmacologic comfort measures 05/09/2019 1803 by Winnifred Friar, RN Outcome: Progressing 05/09/2019 1803 by Winnifred Friar, RN Outcome: Progressing   Problem: Health Behavior/Discharge Planning: Goal: Ability to manage health-related needs will improve 05/09/2019 1803 by Winnifred Friar, RN Outcome: Progressing 05/09/2019 1803 by Winnifred Friar, RN Outcome: Progressing   Problem: Clinical Measurements: Goal: Ability to maintain clinical measurements within normal limits will improve 05/09/2019 1803 by Winnifred Friar, RN Outcome: Progressing 05/09/2019 1803 by Winnifred Friar, RN Outcome: Progressing Goal: Will remain free from infection 05/09/2019 1803 by Winnifred Friar, RN Outcome: Progressing 05/09/2019 1803 by Winnifred Friar, RN Outcome: Progressing Goal: Diagnostic test results will improve 05/09/2019 1803 by Winnifred Friar, RN Outcome: Progressing 05/09/2019 1803 by Winnifred Friar, RN Outcome: Progressing Goal: Respiratory complications will improve 05/09/2019 1803 by Winnifred Friar, RN Outcome: Progressing 05/09/2019 1803 by Winnifred Friar, RN Outcome: Progressing Goal: Cardiovascular complication will be avoided 05/09/2019 1803 by Winnifred Friar, RN Outcome: Progressing 05/09/2019 1803 by Winnifred Friar, RN Outcome: Progressing   Problem: Activity: Goal: Risk for activity intolerance will decrease 05/09/2019 1803 by Winnifred Friar, RN Outcome: Progressing 05/09/2019 1803 by Winnifred Friar, RN Outcome: Progressing   Problem: Nutrition: Goal: Adequate nutrition will be maintained 05/09/2019 1803 by Winnifred Friar, RN Outcome: Progressing 05/09/2019 1803 by Winnifred Friar, RN Outcome: Progressing   Problem: Coping: Goal: Level of anxiety will decrease 05/09/2019 1803 by Winnifred Friar, RN Outcome: Progressing 05/09/2019 1803 by Winnifred Friar, RN Outcome: Progressing   Problem: Elimination: Goal: Will not experience complications related to bowel motility 05/09/2019 1803 by Winnifred Friar, RN Outcome: Progressing 05/09/2019 1803 by Winnifred Friar, RN Outcome: Progressing Goal: Will not experience complications related to urinary retention 05/09/2019 1803 by Winnifred Friar, RN Outcome: Progressing 05/09/2019 1803 by Winnifred Friar, RN Outcome: Progressing   Problem: Pain Managment: Goal: General experience of comfort will improve 05/09/2019 1803 by Winnifred Friar, RN Outcome: Progressing 05/09/2019 1803 by Winnifred Friar, RN Outcome: Progressing   Problem: Safety: Goal: Ability  to remain free from injury will improve 05/09/2019 1803 by Winnifred Friar, RN Outcome: Progressing 05/09/2019 1803 by Winnifred Friar, RN Outcome: Progressing   Problem: Skin Integrity: Goal: Risk for impaired  skin integrity will decrease 05/09/2019 1803 by Orvan Falconer, RN Outcome: Progressing 05/09/2019 1803 by Orvan Falconer, RN Outcome: Progressing

## 2019-05-09 NOTE — Progress Notes (Signed)
[facility] PROGRESS NOTE    Progress Note  Max Lozano  XFG:[id number] DOB: Jan 05, 1951 DOA: May 27, 2019 PCP: Birdie Sons, MD     Brief Narrative:   Max Lozano is an 69 y.o. male past medical history no nodal dysfunction chronic atrial fibrillation on Xarelto syncope and hypertension with morbid obesity comes into the hospital for 4 days of nonproductive cough and fevers tested positive for COVID-19 on April 25, 2020 [place] ED was found to be satting in the 80s placed on 6 L chest x-ray showed bilateral infiltrates and SARS-CoV-2 was positive  Assessment/Plan:   Acute respiratory failure with hypoxia secondary to pneumonia due to COVID-19:  His oxygen requirements have worsened he is now requiring 15 L on nonrebreather to keep saturations greater than 88%. Continue IV remdesivir and steroids he did receive Actemra on 2019-05-27 Strict I's and O's and daily weights, try to keep the patient peripherally 16 hours a day, if not prone out of bed to chair. Consult physical therapy. He has agreed to a second dose of Actemra, The treatment plan and use of Actemra and known side effects were discussed with patient/family, they were clearly explained that there is no proven definitive treatment for COVID-19 infection, any medications used here are based on published clinical articles/anecdotal data which are not peer-reviewed or randomized control trials.  Complete risks and long-term side effects are unknown, however in the best clinical judgment they seem to be of some clinical benefit rather than medical risks.  Patient/family agree with the treatment plan and want to receive the given medications.  HYPERTENSION, BENIGN ESSENTIAL Blood pressure is fairly controlled continue diltiazem and propranolol.  Permanent atrial fibrillation (HCC)/Sinoatrial node dysfunction (HCC) Rate control on diltiazem and metoprolol, continue Xarelto he is currently asymptomatic.  Uncontrolled  diabetes mellitus type 2 without complications: Was running extremely high. Last A1c on 2017 was 6.5, will start him on long-acting insulin plus sliding scale resistance.  Check an A1c today.  DVT prophylaxis: lovenox Family Communication:none Disposition Plan/Barrier to D/C: unable to determine Code Status:     Code Status Orders  (From admission, onward)         Start     Ordered   May 27, 2019 1339  Full code  Continuous     05/27/19 1341        Code Status History    This patient has a current code status but no historical code status.   Advance Care Planning Activity        IV Access:    Peripheral IV   Procedures and diagnostic studies:   DG Chest Portable 1 View  Result Date: 05/07/2019 CLINICAL DATA:  Shortness of breath EXAM: PORTABLE CHEST 1 VIEW COMPARISON:  10/15/2014 FINDINGS: Left-sided pacing device with leads over the right atrium and right ventricle. Vague ground-glass opacity in the mid lung zones. Enlarged cardiomediastinal silhouette. No pleural effusion or pneumothorax. IMPRESSION: Suspect vague ground-glass opacities in the mid lung zones as may be seen with pneumonia, possible atypical or viral pneumonia. Electronically Signed   By: Donavan Foil M.D.   On: 05/07/2019 23:22     Medical Consultants:    None.  Anti-Infectives:   IV remdesivir  Subjective:    Max Lozano relates his breathing is not better than when he came in he feels slightly more short of breath.  Objective:    Vitals:   05/27/2019 2300 05/09/19 0200 05/09/19 0435 05/09/19 0600  BP: (!) 150/75 (!) 150/76 Marland Kitchen)  148/74   Pulse: 60 60 60   Resp: (!) 26 (!) 25 (!) 25   Temp: 98.9 F (37.2 C) 98.6 F (37 C) (!) 97.5 F (36.4 C)   TempSrc: Oral Oral Oral   SpO2:  (!) 89% (!) 86% 90%  Height:       SpO2: 90 % O2 Flow Rate (L/min): 15 L/min   Intake/Output Summary (Last 24 hours) at 05/09/2019 0711 Last data filed at 05/09/2019 0600 Gross per 24 hour  Intake 900 ml   Output 2200 ml  Net -1300 ml   There were no vitals filed for this visit.  Exam: General exam: In no acute distress. Respiratory system: Good air movement and clear to auscultation. Cardiovascular system: S1 & S2 heard, RRR. No JVD. Gastrointestinal system: Abdomen is nondistended, soft and nontender.  Central nervous system: Alert and oriented. No focal neurological deficits. Extremities: No pedal edema. Skin: No rashes, lesions or ulcers Psychiatry: Judgement and insight appear normal. Mood & affect appropriate.    Data Reviewed:    Labs: Basic Metabolic Panel: Recent Labs  Lab 05/07/19 2239 06/04/2019 1420 05/09/19 0217  NA 135  --  140  K 4.1  --  5.2*  CL 102  --  105  CO2 23  --  25  GLUCOSE 229*  --  337*  BUN 21  --  31*  CREATININE 1.03 1.03 1.02  CALCIUM 8.6*  --  9.1   GFR Estimated Creatinine Clearance: 83.8 mL/min (by C-G formula based on SCr of 1.02 mg/dL). Liver Function Tests: Recent Labs  Lab 05/07/19 2239 05/09/19 0217  AST 44* 36  ALT 28 28  ALKPHOS 64 65  BILITOT 1.0 0.7  PROT 7.5 6.7  ALBUMIN 3.7 3.2*   No results for input(s): LIPASE, AMYLASE in the last 168 hours. No results for input(s): AMMONIA in the last 168 hours. Coagulation profile No results for input(s): INR, PROTIME in the last 168 hours. COVID-19 Labs  Recent Labs    05/07/19 2239  FERRITIN 992*  LDH 422*  CRP 6.6*    Lab Results  Component Value Date   SARSCOV2NAA Not Detected 12/08/2018    CBC: Recent Labs  Lab 05/07/19 2239 05/14/2019 1420 05/09/19 0217  WBC 7.0 7.2 7.8  NEUTROABS 4.5  --  4.5  HGB 14.8 14.9 14.1  HCT 42.7 46.7 43.9  MCV 88.8 95.1 94.6  PLT 97* 108* 107*   Cardiac Enzymes: No results for input(s): CKTOTAL, CKMB, CKMBINDEX, TROPONINI in the last 168 hours. BNP (last 3 results) No results for input(s): PROBNP in the last 8760 hours. CBG: Recent Labs  Lab 05/19/2019 2122  GLUCAP 395*   D-Dimer: No results for input(s): DDIMER in  the last 72 hours. Hgb A1c: No results for input(s): HGBA1C in the last 72 hours. Lipid Profile: Recent Labs    05/07/19 2239  TRIG 97   Thyroid function studies: No results for input(s): TSH, T4TOTAL, T3FREE, THYROIDAB in the last 72 hours.  Invalid input(s): FREET3 Anemia work up: Recent Labs    05/07/19 2239  FERRITIN 992*   Sepsis Labs: Recent Labs  Lab 05/07/19 2239 05/07/19 2333 05/15/2019 1420 05/09/19 0217  PROCALCITON <0.10  --   --   --   WBC 7.0  --  7.2 7.8  LATICACIDVEN 1.7 1.5  --   --    Microbiology Recent Results (from the past 240 hour(s))  Blood Culture (routine x 2)     Status: None (Preliminary result)  Collection Time: 05/07/19 11:33 PM   Specimen: BLOOD  Result Value Ref Range Status   Specimen Description BLOOD RIGHT ANTECUBITAL  Final   Special Requests   Final    BOTTLES DRAWN AEROBIC AND ANAEROBIC Blood Culture adequate volume   Culture   Final    NO GROWTH 2 DAYS Performed at [facility], [address] [place], [place] [address]    Report Status PENDING  Incomplete  Blood Culture (routine x 2)     Status: None (Preliminary result)   Collection Time: 05/07/19 11:33 PM   Specimen: BLOOD  Result Value Ref Range Status   Specimen Description BLOOD LEFT ANTECUBITAL  Final   Special Requests   Final    BOTTLES DRAWN AEROBIC AND ANAEROBIC Blood Culture adequate volume   Culture   Final    NO GROWTH 2 DAYS Performed at [facility], [address] [place], [place] [address]    Report Status PENDING  Incomplete     Medications:   . vitamin C  500 mg Oral Daily  . atorvastatin  40 mg Oral q1800  . dexamethasone (DECADRON) injection  6 mg Intravenous Q12H  . diltiazem  240 mg Oral Daily  . influenza vaccine adjuvanted  0.5 mL Intramuscular Tomorrow-1000  . Ipratropium-Albuterol  2 puff Inhalation Q6H  . pneumococcal 23 valent vaccine  0.5 mL Intramuscular Tomorrow-1000  . propranolol ER  80 mg Oral Daily  .  rivaroxaban  20 mg Oral Q supper  . zinc sulfate  220 mg Oral Daily   Continuous Infusions: . remdesivir 100 mg in NS 100 mL       LOS: 1 day   Marinda Elk  [facility]  05/09/2019, 7:11 AM

## 2019-05-10 LAB — COMPREHENSIVE METABOLIC PANEL
ALT: 28 U/L (ref 0–44)
AST: 37 U/L (ref 15–41)
Albumin: 3.3 g/dL — ABNORMAL LOW (ref 3.5–5.0)
Alkaline Phosphatase: 80 U/L (ref 38–126)
Anion gap: 10 (ref 5–15)
BUN: 37 mg/dL — ABNORMAL HIGH (ref 8–23)
CO2: 24 mmol/L (ref 22–32)
Calcium: 8.8 mg/dL — ABNORMAL LOW (ref 8.9–10.3)
Chloride: 106 mmol/L (ref 98–111)
Creatinine, Ser: 0.9 mg/dL (ref 0.61–1.24)
GFR calc Af Amer: >60 mL/min (ref >60)
GFR calc non Af Amer: >60 mL/min (ref >60)
Glucose, Bld: 278 mg/dL — ABNORMAL HIGH (ref 70–99)
Potassium: 4.2 mmol/L (ref 3.5–5.1)
Sodium: 140 mmol/L (ref 135–145)
Total Bilirubin: 0.4 mg/dL (ref 0.3–1.2)
Total Protein: 6.7 g/dL (ref 6.5–8.1)

## 2019-05-10 LAB — CBC WITH DIFFERENTIAL/PLATELET
Abs Immature Granulocytes: 0.15 10*3/uL — ABNORMAL HIGH (ref 0.00–0.07)
Basophils Absolute: 0 10*3/uL (ref 0.0–0.1)
Basophils Relative: 0 %
Eosinophils Absolute: 0 10*3/uL (ref 0.0–0.5)
Eosinophils Relative: 0 %
HCT: 42.6 % (ref 39.0–52.0)
Hemoglobin: 13.9 g/dL (ref 13.0–17.0)
Immature Granulocytes: 1 %
Lymphocytes Relative: 26 %
Lymphs Abs: 4.4 10*3/uL — ABNORMAL HIGH (ref 0.7–4.0)
MCH: 30.8 pg (ref 26.0–34.0)
MCHC: 32.6 g/dL (ref 30.0–36.0)
MCV: 94.5 fL (ref 80.0–100.0)
Monocytes Absolute: 0.8 10*3/uL (ref 0.1–1.0)
Monocytes Relative: 5 %
Neutro Abs: 11.8 10*3/uL — ABNORMAL HIGH (ref 1.7–7.7)
Neutrophils Relative %: 68 %
Platelets: 136 10*3/uL — ABNORMAL LOW (ref 150–400)
RBC: 4.51 MIL/uL (ref 4.22–5.81)
RDW: 13.8 % (ref 11.5–15.5)
WBC: 17.2 10*3/uL — ABNORMAL HIGH (ref 4.0–10.5)
nRBC: 0 % (ref 0.0–0.2)

## 2019-05-10 LAB — GLUCOSE, CAPILLARY
Glucose-Capillary: 258 mg/dL — ABNORMAL HIGH (ref 70–99)
Glucose-Capillary: 311 mg/dL — ABNORMAL HIGH (ref 70–99)
Glucose-Capillary: 277 mg/dL — ABNORMAL HIGH (ref 70–99)

## 2019-05-10 LAB — D-DIMER, QUANTITATIVE: D-Dimer, Quant: >20 ug/mL-FEU — ABNORMAL HIGH (ref 0.00–0.50)

## 2019-05-10 LAB — PROCALCITONIN: Procalcitonin: <0.1 ng/mL

## 2019-05-10 LAB — C-REACTIVE PROTEIN: CRP: 2 mg/dL — ABNORMAL HIGH (ref <1.0)

## 2019-05-10 MED ORDER — INSULIN DETEMIR 100 UNIT/ML ~~LOC~~ SOLN
40.0000 [IU] | Freq: Two times a day (BID) | SUBCUTANEOUS | Status: DC
  Administered 2019-05-10 – 2019-05-11 (×2): 40 [IU] via SUBCUTANEOUS
  Filled 2019-05-10 (×3): qty 0.4

## 2019-05-10 NOTE — Plan of Care (Addendum)
Patient up to chair majority of the day. Patient O2 sats continue to drop in low 80% with HFNC at 15L. No accessory muscle use, no s/s of pain or distress. Updated daughter Wilkie Aye. Will continue to monitor.  Problem: Education: Goal: Knowledge of risk factors and measures for prevention of condition will improve 05/10/2019 0903 by Winnifred Friar, RN Outcome: Progressing 05/10/2019 0903 by Winnifred Friar, RN Outcome: Progressing   Problem: Coping: Goal: Psychosocial and spiritual needs will be supported 05/10/2019 0903 by Winnifred Friar, RN Outcome: Progressing 05/10/2019 0903 by Winnifred Friar, RN Outcome: Progressing   Problem: Respiratory: Goal: Will maintain a patent airway 05/10/2019 0903 by Winnifred Friar, RN Outcome: Progressing 05/10/2019 0903 by Winnifred Friar, RN Outcome: Progressing Goal: Complications related to the disease process, condition or treatment will be avoided or minimized 05/10/2019 0903 by Winnifred Friar, RN Outcome: Progressing 05/10/2019 0903 by Winnifred Friar, RN Outcome: Progressing   Problem: Education: Goal: Knowledge of General Education information will improve Description: Including pain rating scale, medication(s)/side effects and non-pharmacologic comfort measures 05/10/2019 0903 by Winnifred Friar, RN Outcome: Progressing 05/10/2019 0903 by Winnifred Friar, RN Outcome: Progressing   Problem: Health Behavior/Discharge Planning: Goal: Ability to manage health-related needs will improve 05/10/2019 0903 by Winnifred Friar, RN Outcome: Progressing 05/10/2019 0903 by Winnifred Friar, RN Outcome: Progressing   Problem: Clinical Measurements: Goal: Ability to maintain clinical measurements within normal limits will improve 05/10/2019 0903 by Winnifred Friar, RN Outcome: Progressing 05/10/2019 0903 by Winnifred Friar, RN Outcome: Progressing Goal: Will remain free from infection 05/10/2019 0903 by Winnifred Friar,  RN Outcome: Progressing 05/10/2019 0903 by Winnifred Friar, RN Outcome: Progressing Goal: Diagnostic test results will improve 05/10/2019 0903 by Winnifred Friar, RN Outcome: Progressing 05/10/2019 0903 by Winnifred Friar, RN Outcome: Progressing Goal: Respiratory complications will improve 05/10/2019 0903 by Winnifred Friar, RN Outcome: Progressing 05/10/2019 0903 by Winnifred Friar, RN Outcome: Progressing Goal: Cardiovascular complication will be avoided 05/10/2019 0903 by Winnifred Friar, RN Outcome: Progressing 05/10/2019 0903 by Winnifred Friar, RN Outcome: Progressing   Problem: Activity: Goal: Risk for activity intolerance will decrease 05/10/2019 0903 by Winnifred Friar, RN Outcome: Progressing 05/10/2019 0903 by Winnifred Friar, RN Outcome: Progressing   Problem: Nutrition: Goal: Adequate nutrition will be maintained 05/10/2019 0903 by Winnifred Friar, RN Outcome: Progressing 05/10/2019 0903 by Winnifred Friar, RN Outcome: Progressing   Problem: Coping: Goal: Level of anxiety will decrease 05/10/2019 0903 by Winnifred Friar, RN Outcome: Progressing 05/10/2019 0903 by Winnifred Friar, RN Outcome: Progressing   Problem: Elimination: Goal: Will not experience complications related to bowel motility 05/10/2019 0903 by Winnifred Friar, RN Outcome: Progressing 05/10/2019 0903 by Winnifred Friar, RN Outcome: Progressing Goal: Will not experience complications related to urinary retention 05/10/2019 0903 by Winnifred Friar, RN Outcome: Progressing 05/10/2019 0903 by Winnifred Friar, RN Outcome: Progressing   Problem: Pain Managment: Goal: General experience of comfort will improve 05/10/2019 0903 by Winnifred Friar, RN Outcome: Progressing 05/10/2019 0903 by Winnifred Friar, RN Outcome: Progressing   Problem: Safety: Goal: Ability to remain free from injury will improve 05/10/2019 0903 by Winnifred Friar, RN Outcome:  Progressing 05/10/2019 0903 by Winnifred Friar, RN Outcome: Progressing   Problem: Skin Integrity: Goal: Risk for impaired skin integrity will decrease 05/10/2019 0903 by Winnifred Friar, RN Outcome: Progressing 05/10/2019 0903 by Winnifred Friar, RN  Outcome: Progressing   

## 2019-05-11 ENCOUNTER — Inpatient Hospital Stay (HOSPITAL_COMMUNITY): Payer: Private Health Insurance - Indemnity

## 2019-05-11 DIAGNOSIS — R791 Abnormal coagulation profile: Secondary | ICD-10-CM

## 2019-05-11 LAB — COMPREHENSIVE METABOLIC PANEL
ALT: 31 U/L (ref 0–44)
AST: 40 U/L (ref 15–41)
Albumin: 3.3 g/dL — ABNORMAL LOW (ref 3.5–5.0)
Alkaline Phosphatase: 110 U/L (ref 38–126)
Anion gap: 10 (ref 5–15)
BUN: 34 mg/dL — ABNORMAL HIGH (ref 8–23)
CO2: 24 mmol/L (ref 22–32)
Calcium: 8.9 mg/dL (ref 8.9–10.3)
Chloride: 108 mmol/L (ref 98–111)
Creatinine, Ser: 0.85 mg/dL (ref 0.61–1.24)
GFR calc Af Amer: >60 mL/min (ref >60)
GFR calc non Af Amer: >60 mL/min (ref >60)
Glucose, Bld: 209 mg/dL — ABNORMAL HIGH (ref 70–99)
Potassium: 4.5 mmol/L (ref 3.5–5.1)
Sodium: 142 mmol/L (ref 135–145)
Total Bilirubin: 0.7 mg/dL (ref 0.3–1.2)
Total Protein: 6.4 g/dL — ABNORMAL LOW (ref 6.5–8.1)

## 2019-05-11 LAB — GLUCOSE, CAPILLARY
Glucose-Capillary: 287 mg/dL — ABNORMAL HIGH (ref 70–99)
Glucose-Capillary: 232 mg/dL — ABNORMAL HIGH (ref 70–99)
Glucose-Capillary: 268 mg/dL — ABNORMAL HIGH (ref 70–99)
Glucose-Capillary: 218 mg/dL — ABNORMAL HIGH (ref 70–99)
Glucose-Capillary: 273 mg/dL — ABNORMAL HIGH (ref 70–99)

## 2019-05-11 LAB — CBC WITH DIFFERENTIAL/PLATELET
Abs Immature Granulocytes: 0.32 10*3/uL — ABNORMAL HIGH (ref 0.00–0.07)
Basophils Absolute: 0.1 10*3/uL (ref 0.0–0.1)
Basophils Relative: 0 %
Eosinophils Absolute: 0.1 10*3/uL (ref 0.0–0.5)
Eosinophils Relative: 0 %
HCT: 43.1 % (ref 39.0–52.0)
Hemoglobin: 13.9 g/dL (ref 13.0–17.0)
Immature Granulocytes: 1 %
Lymphocytes Relative: 27 %
Lymphs Abs: 6.2 10*3/uL — ABNORMAL HIGH (ref 0.7–4.0)
MCH: 30.1 pg (ref 26.0–34.0)
MCHC: 32.3 g/dL (ref 30.0–36.0)
MCV: 93.3 fL (ref 80.0–100.0)
Monocytes Absolute: 1 10*3/uL (ref 0.1–1.0)
Monocytes Relative: 4 %
Neutro Abs: 15.5 10*3/uL — ABNORMAL HIGH (ref 1.7–7.7)
Neutrophils Relative %: 68 %
Platelets: 134 10*3/uL — ABNORMAL LOW (ref 150–400)
RBC: 4.62 MIL/uL (ref 4.22–5.81)
RDW: 13.7 % (ref 11.5–15.5)
WBC: 23 10*3/uL — ABNORMAL HIGH (ref 4.0–10.5)
nRBC: 0 % (ref 0.0–0.2)

## 2019-05-11 LAB — PROCALCITONIN: Procalcitonin: <0.1 ng/mL

## 2019-05-11 LAB — D-DIMER, QUANTITATIVE: D-Dimer, Quant: >20 ug/mL-FEU — ABNORMAL HIGH (ref 0.00–0.50)

## 2019-05-11 LAB — C-REACTIVE PROTEIN: CRP: 1.2 mg/dL — ABNORMAL HIGH (ref <1.0)

## 2019-05-11 MED ORDER — VANCOMYCIN HCL 2000 MG/400ML IV SOLN
2000.0000 mg | Freq: Once | INTRAVENOUS | Status: AC
  Administered 2019-05-11: 2000 mg via INTRAVENOUS
  Filled 2019-05-11: qty 400

## 2019-05-11 MED ORDER — INSULIN DETEMIR 100 UNIT/ML ~~LOC~~ SOLN
20.0000 [IU] | Freq: Once | SUBCUTANEOUS | Status: AC
  Administered 2019-05-11: 20 [IU] via SUBCUTANEOUS
  Filled 2019-05-11: qty 0.2

## 2019-05-11 MED ORDER — INSULIN ASPART 100 UNIT/ML ~~LOC~~ SOLN
15.0000 [IU] | Freq: Three times a day (TID) | SUBCUTANEOUS | Status: DC
  Administered 2019-05-11 – 2019-05-12 (×4): 15 [IU] via SUBCUTANEOUS

## 2019-05-11 MED ORDER — SODIUM CHLORIDE 0.9 % IV SOLN
2.0000 g | Freq: Three times a day (TID) | INTRAVENOUS | Status: AC
  Administered 2019-05-11 – 2019-05-15 (×14): 2 g via INTRAVENOUS
  Filled 2019-05-11 (×11): qty 2

## 2019-05-11 MED ORDER — INSULIN ASPART 100 UNIT/ML ~~LOC~~ SOLN
9.0000 [IU] | Freq: Three times a day (TID) | SUBCUTANEOUS | Status: DC
  Administered 2019-05-11: 9 [IU] via SUBCUTANEOUS

## 2019-05-11 MED ORDER — IOHEXOL 350 MG/ML SOLN
100.0000 mL | Freq: Once | INTRAVENOUS | Status: AC | PRN
  Administered 2019-05-11: 100 mL via INTRAVENOUS

## 2019-05-11 MED ORDER — AMLODIPINE BESYLATE 5 MG PO TABS
5.0000 mg | ORAL_TABLET | Freq: Every day | ORAL | Status: DC
  Administered 2019-05-11 – 2019-05-15 (×5): 5 mg via ORAL
  Filled 2019-05-11 (×5): qty 1

## 2019-05-11 MED ORDER — ENOXAPARIN SODIUM 100 MG/ML ~~LOC~~ SOLN
1.0000 mg/kg | Freq: Two times a day (BID) | SUBCUTANEOUS | Status: DC
  Administered 2019-05-12 – 2019-05-17 (×12): 100 mg via SUBCUTANEOUS
  Filled 2019-05-11 (×16): qty 1

## 2019-05-11 MED ORDER — ENOXAPARIN SODIUM 100 MG/ML ~~LOC~~ SOLN
1.0000 mg/kg | Freq: Once | SUBCUTANEOUS | Status: AC
  Administered 2019-05-11: 100 mg via SUBCUTANEOUS
  Filled 2019-05-11: qty 1

## 2019-05-11 MED ORDER — HYDRALAZINE HCL 25 MG PO TABS: 25.0000 mg | ORAL_TABLET | Freq: Four times a day (QID) | ORAL | Status: DC | PRN

## 2019-05-11 MED ORDER — INSULIN DETEMIR 100 UNIT/ML ~~LOC~~ SOLN
55.0000 [IU] | Freq: Two times a day (BID) | SUBCUTANEOUS | Status: DC
  Administered 2019-05-11 – 2019-05-12 (×3): 55 [IU] via SUBCUTANEOUS
  Filled 2019-05-11 (×4): qty 0.55

## 2019-05-11 MED ORDER — VANCOMYCIN HCL IN DEXTROSE 1-5 GM/200ML-% IV SOLN
1000.0000 mg | Freq: Two times a day (BID) | INTRAVENOUS | Status: AC
  Administered 2019-05-12 – 2019-05-15 (×8): 1000 mg via INTRAVENOUS
  Filled 2019-05-11 (×10): qty 200

## 2019-05-11 NOTE — Progress Notes (Addendum)
Pharmacy Antibiotic Note  Max Lozano is a 69 y.o. male admitted on 05-10-2019 with COVID-19 PNA.   Pharmacy has been consulted for empiric vancomycin and cefepime dosing. WBC significantly elevated though patient is on steroids. Low threshold to stop antibiotics per MD.   PCT < 0.1 x 2  Plan: Vancomycin 2000 mg iv once followed by vancomycin 1000 mg IV Q 12 hrs. Goal AUC 400-550. Expected AUC: 0.85 SCr used: 523  Cefepime 2 g IV q 8 h  F/U SCr, culture results, MRSA PCR, duration of therapy  Height: 5\' 10"  (177.8 cm) IBW/kg (Calculated) : 73  Temp (24hrs), Avg:97.8 F (36.6 C), Min:97.4 F (36.3 C), Max:98.4 F (36.9 C)  Recent Labs  Lab 05/07/19 2239 05/07/19 2333 2019/05/10 1420 05/09/19 0217 05/10/19 0119 05/11/19 0142  WBC 7.0  --  7.2 7.8 17.2* 23.0*  CREATININE 1.03  --  1.03 1.02 0.90 0.85  LATICACIDVEN 1.7 1.5  --   --   --   --     Estimated Creatinine Clearance: 100.6 mL/min (by C-G formula based on SCr of 0.85 mg/dL).    Allergies  Allergen Reactions  . Atenolol Rash    Antimicrobials this admission: 1/4 vancomycin >>  1/4 cefepime >>    Dose adjustments this admission:   Microbiology results: 12/31 BCx: ngtd 1/4 MRSA PCR:   Thank you for allowing pharmacy to be a part of this patient's care.  1/32 D 05/11/2019 11:57 AM

## 2019-05-11 NOTE — Progress Notes (Addendum)
[facility] PROGRESS NOTE    Progress Note  Max Lozano  ALP:[id number] DOB: [birth year] DOA: 05/17/2019 PCP: Malva Limes, MD     Brief Narrative:   Max Lozano is an 69 y.o. male past medical history no nodal dysfunction chronic atrial fibrillation on Xarelto syncope and hypertension with morbid obesity comes into the hospital for 4 days of nonproductive cough and fevers tested positive for COVID-19 on April 25, 2020 [facility] ED was found to be satting in the 80s placed on 6 L chest x-ray showed bilateral infiltrates and SARS-CoV-2 was positive  Assessment/Plan:   Acute respiratory failure with hypoxia secondary to pneumonia due to COVID-19:  Max Lozano is requiring 15 L of oxygen to keep saturations greater than 88%. Continue IV remdesivir and steroids, he did receive Actemra on 05/17/2019 and again on 05/08/2018.  Try to keep strict I's and O's, daily weights, try to keep the patient prone for at least 16 hours a day if not prone out of bed to chair, continue incentive spirometry and flutter valve. Physical therapy therapy evaluated the patient recommended home health PT. His D-dimer this morning is greater than 20 his CRP is improving, he is currently on Xarelto he is not tachycardic. Lower ext Doppler showed new DVT CT angio negative for PE. He failed Xarelto, change to Lovenox full Therapy for VTE.  HYPERTENSION, BENIGN ESSENTIAL His blood pressure has been trending up, will go ahead and give you IV fluid continue diltiazem and metoprolol start him on Norvasc and oral hydralazine as needed.  Permanent atrial fibrillation (HCC)/Sinoatrial node dysfunction (HCC) Rate control on diltiazem and metoprolol, continue Xarelto he is currently asymptomatic.  Uncontrolled diabetes mellitus type 2 without complications: Blood glucose was extremely high his long-acting insulin and his sliding scale was changed to resistant and his blood glucose improving.  We will continue to  follow closely throughout the day.  DVT prophylaxis: lovenox Family Communication:none Disposition Plan/Barrier to D/C: unable to determine Code Status:     Code Status Orders  (From admission, onward)         Start     Ordered   06/06/2019 1339  Full code  Continuous     05/09/2019 1341        Code Status History    This patient has a current code status but no historical code status.   Advance Care Planning Activity        IV Access:    Peripheral IV   Procedures and diagnostic studies:   No results found.   Medical Consultants:    None.  Anti-Infectives:   IV remdesivir  Subjective:    Max Lozano relates his breathing is worse than yesterday.  Objective:    Vitals:   05/10/19 1600 05/10/19 1900 05/11/19 0700 05/11/19 0730  BP: (!) 172/75 (!) 170/79  (!) 186/73  Pulse: (!) 59 61    Resp: (!) 35 (!) 21    Temp: 97.6 F (36.4 C) 97.8 F (36.6 C)  (!) 97.4 F (36.3 C)  TempSrc: Oral Oral  Oral  SpO2: (!) 87% 92% 99%   Height:       SpO2: 99 % O2 Flow Rate (L/min): 15 L/min   Intake/Output Summary (Last 24 hours) at 05/11/2019 0803 Last data filed at 05/11/2019 0500 Gross per 24 hour  Intake 730 ml  Output 350 ml  Net 380 ml   There were no vitals filed for this visit.  Exam: General exam: In  no acute distress, not able to speak in full sentences. Respiratory system: Good air movement and diffuse crackles bilaterally Cardiovascular system: S1 & S2 heard, RRR. No JVD. Gastrointestinal system: Abdomen is nondistended, soft and nontender.  Central nervous system: Alert and oriented. No focal neurological deficits. Extremities: No pedal edema. Skin: No rashes, lesions or ulcers Psychiatry: Judgement and insight appear normal. Mood & affect appropriate.    Data Reviewed:    Labs: Basic Metabolic Panel: Recent Labs  Lab 05/07/19 2239 06/04/2019 1420 05/09/19 0217 05/10/19 0119 05/11/19 0142  NA 135  --  140 140 142  K 4.1  --   5.2* 4.2 4.5  CL 102  --  105 106 108  CO2 23  --  25 24 24   GLUCOSE 229*  --  337* 278* 209*  BUN 21  --  31* 37* 34*  CREATININE 1.03 1.03 1.02 0.90 0.85  CALCIUM 8.6*  --  9.1 8.8* 8.9   GFR Estimated Creatinine Clearance: 100.6 mL/min (by C-G formula based on SCr of 0.85 mg/dL). Liver Function Tests: Recent Labs  Lab 05/07/19 2239 05/09/19 0217 05/10/19 0119 05/11/19 0142  AST 44* 36 37 40  ALT 28 28 28 31   ALKPHOS 64 65 80 110  BILITOT 1.0 0.7 0.4 0.7  PROT 7.5 6.7 6.7 6.4*  ALBUMIN 3.7 3.2* 3.3* 3.3*   No results for input(s): LIPASE, AMYLASE in the last 168 hours. No results for input(s): AMMONIA in the last 168 hours. Coagulation profile No results for input(s): INR, PROTIME in the last 168 hours. COVID-19 Labs  Recent Labs    05/10/19 0935 05/10/19 1000 05/11/19 0142  DDIMER >20.00*  --  >20.00*  CRP  --  2.0* 1.2*    Lab Results  Component Value Date   SARSCOV2NAA Not Detected 12/08/2018    CBC: Recent Labs  Lab 05/07/19 2239 05/28/2019 1420 05/09/19 0217 05/10/19 0119 05/11/19 0142  WBC 7.0 7.2 7.8 17.2* 23.0*  NEUTROABS 4.5  --  4.5 11.8* 15.5*  HGB 14.8 14.9 14.1 13.9 13.9  HCT 42.7 46.7 43.9 42.6 43.1  MCV 88.8 95.1 94.6 94.5 93.3  PLT 97* 108* 107* 136* 134*   Cardiac Enzymes: No results for input(s): CKTOTAL, CKMB, CKMBINDEX, TROPONINI in the last 168 hours. BNP (last 3 results) No results for input(s): PROBNP in the last 8760 hours. CBG: Recent Labs  Lab 05/09/19 1631 05/09/19 2034 05/10/19 0737 05/10/19 1133 05/10/19 1636  GLUCAP 327* 350* 258* 311* 277*   D-Dimer: Recent Labs    05/10/19 0935 05/11/19 0142  DDIMER >20.00* >20.00*   Hgb A1c: Recent Labs    05/09/19 0217  HGBA1C 8.5*   Lipid Profile: No results for input(s): CHOL, HDL, LDLCALC, TRIG, CHOLHDL, LDLDIRECT in the last 72 hours. Thyroid function studies: No results for input(s): TSH, T4TOTAL, T3FREE, THYROIDAB in the last 72 hours.  Invalid input(s):  FREET3 Anemia work up: No results for input(s): VITAMINB12, FOLATE, FERRITIN, TIBC, IRON, RETICCTPCT in the last 72 hours. Sepsis Labs: Recent Labs  Lab 05/07/19 2239 05/07/19 2333 05/31/2019 1420 05/09/19 0217 05/10/19 0119 05/10/19 0935 05/11/19 0142  PROCALCITON <0.10  --   --   --   --  <0.10 <0.10  WBC 7.0  --  7.2 7.8 17.2*  --  23.0*  LATICACIDVEN 1.7 1.5  --   --   --   --   --    Microbiology Recent Results (from the past 240 hour(s))  Blood Culture (routine x 2)  Status: None (Preliminary result)   Collection Time: 05/07/19 11:33 PM   Specimen: BLOOD  Result Value Ref Range Status   Specimen Description BLOOD RIGHT ANTECUBITAL  Final   Special Requests   Final    BOTTLES DRAWN AEROBIC AND ANAEROBIC Blood Culture adequate volume   Culture   Final    NO GROWTH 4 DAYS Performed at [facility], [address], [place], [place] [address]    Report Status PENDING  Incomplete  Blood Culture (routine x 2)     Status: None (Preliminary result)   Collection Time: 05/07/19 11:33 PM   Specimen: BLOOD  Result Value Ref Range Status   Specimen Description BLOOD LEFT ANTECUBITAL  Final   Special Requests   Final    BOTTLES DRAWN AEROBIC AND ANAEROBIC Blood Culture adequate volume   Culture   Final    NO GROWTH 4 DAYS Performed at [facility], [address] [place], [place] [address]    Report Status PENDING  Incomplete     Medications:   . vitamin C  500 mg Oral Daily  . atorvastatin  40 mg Oral q1800  . dexamethasone (DECADRON) injection  6 mg Intravenous Q12H  . diltiazem  240 mg Oral Daily  . influenza vaccine adjuvanted  0.5 mL Intramuscular Tomorrow-1000  . insulin aspart  0-20 Units Subcutaneous TID WC  . insulin aspart  0-5 Units Subcutaneous QHS  . insulin aspart  6 Units Subcutaneous TID WC  . insulin detemir  40 Units Subcutaneous BID  . Ipratropium-Albuterol  2 puff Inhalation Q6H  . pneumococcal 23 valent vaccine  0.5 mL  Intramuscular Tomorrow-1000  . propranolol ER  80 mg Oral Daily  . rivaroxaban  20 mg Oral Q supper  . zinc sulfate  220 mg Oral Daily   Continuous Infusions: . remdesivir 100 mg in NS 100 mL 100 mg (05/10/19 0859)     LOS: 3 days   Charlynne Cousins  [facility]  05/11/2019, 8:03 AM

## 2019-05-11 NOTE — Plan of Care (Addendum)
Patient up to chair. No s/s of pain or distress. Patient currently on 15L HFNC and 15L Non-Rebreather. Attempted to call wife and daughter no answer/ unable to leave message, will attempt to call later. Will continue to monitor for remainder of shift.   Problem: Education: Goal: Knowledge of risk factors and measures for prevention of condition will improve 05/11/2019 1325 by Orvan Falconer, RN Outcome: Progressing 05/11/2019 1324 by Orvan Falconer, RN Outcome: Progressing   Problem: Coping: Goal: Psychosocial and spiritual needs will be supported 05/11/2019 1325 by Orvan Falconer, RN Outcome: Progressing 05/11/2019 1324 by Orvan Falconer, RN Outcome: Progressing   Problem: Respiratory: Goal: Will maintain a patent airway 05/11/2019 1325 by Orvan Falconer, RN Outcome: Progressing 05/11/2019 1324 by Orvan Falconer, RN Outcome: Progressing Goal: Complications related to the disease process, condition or treatment will be avoided or minimized 05/11/2019 1325 by Orvan Falconer, RN Outcome: Progressing 05/11/2019 1324 by Orvan Falconer, RN Outcome: Progressing   Problem: Education: Goal: Knowledge of General Education information will improve Description: Including pain rating scale, medication(s)/side effects and non-pharmacologic comfort measures 05/11/2019 1325 by Orvan Falconer, RN Outcome: Progressing 05/11/2019 1324 by Orvan Falconer, RN Outcome: Progressing   Problem: Health Behavior/Discharge Planning: Goal: Ability to manage health-related needs will improve 05/11/2019 1325 by Orvan Falconer, RN Outcome: Progressing 05/11/2019 1324 by Orvan Falconer, RN Outcome: Progressing   Problem: Clinical Measurements: Goal: Ability to maintain clinical measurements within normal limits will improve 05/11/2019 1325 by Orvan Falconer, RN Outcome: Progressing 05/11/2019 1324 by Orvan Falconer, RN Outcome: Progressing Goal: Will remain free from  infection 05/11/2019 1325 by Orvan Falconer, RN Outcome: Progressing 05/11/2019 1324 by Orvan Falconer, RN Outcome: Progressing Goal: Diagnostic test results will improve 05/11/2019 1325 by Orvan Falconer, RN Outcome: Progressing 05/11/2019 1324 by Orvan Falconer, RN Outcome: Progressing Goal: Respiratory complications will improve 05/11/2019 1325 by Orvan Falconer, RN Outcome: Progressing 05/11/2019 1324 by Orvan Falconer, RN Outcome: Progressing Goal: Cardiovascular complication will be avoided 05/11/2019 1325 by Orvan Falconer, RN Outcome: Progressing 05/11/2019 1324 by Orvan Falconer, RN Outcome: Progressing   Problem: Activity: Goal: Risk for activity intolerance will decrease 05/11/2019 1325 by Orvan Falconer, RN Outcome: Progressing 05/11/2019 1324 by Orvan Falconer, RN Outcome: Progressing   Problem: Nutrition: Goal: Adequate nutrition will be maintained 05/11/2019 1325 by Orvan Falconer, RN Outcome: Progressing 05/11/2019 1324 by Orvan Falconer, RN Outcome: Progressing   Problem: Coping: Goal: Level of anxiety will decrease 05/11/2019 1325 by Orvan Falconer, RN Outcome: Progressing 05/11/2019 1324 by Orvan Falconer, RN Outcome: Progressing   Problem: Elimination: Goal: Will not experience complications related to bowel motility 05/11/2019 1325 by Orvan Falconer, RN Outcome: Progressing 05/11/2019 1324 by Orvan Falconer, RN Outcome: Progressing Goal: Will not experience complications related to urinary retention 05/11/2019 1325 by Orvan Falconer, RN Outcome: Progressing 05/11/2019 1324 by Orvan Falconer, RN Outcome: Progressing   Problem: Pain Managment: Goal: General experience of comfort will improve 05/11/2019 1325 by Orvan Falconer, RN Outcome: Progressing 05/11/2019 1324 by Orvan Falconer, RN Outcome: Progressing   Problem: Safety: Goal: Ability to remain free from injury will improve 05/11/2019 1325 by  Orvan Falconer, RN Outcome: Progressing 05/11/2019 1324 by Orvan Falconer, RN Outcome: Progressing   Problem: Skin Integrity: Goal: Risk for impaired skin integrity will decrease 05/11/2019 1325 by Orvan Falconer, RN Outcome: Progressing 05/11/2019  1324 by Winnifred Friar, RN Outcome: Progressing

## 2019-05-11 NOTE — Progress Notes (Signed)
ANTICOAGULATION CONSULT NOTE - Initial Consult  Pharmacy Consult for Lovenox Indication: DVT (failed rivaroxaban)  Allergies  Allergen Reactions  . Atenolol Rash    Patient Measurements: Height: 5\' 10"  (177.8 cm) IBW/kg (Calculated) : 73   Vital Signs: Temp: 97.8 F (36.6 C) (01/04 1144) Temp Source: Axillary (01/04 1144) BP: 163/76 (01/04 1144) Pulse Rate: 61 (01/04 1144)  Labs: Recent Labs    05/09/19 0217 05/10/19 0119 05/11/19 0142  HGB 14.1 13.9 13.9  HCT 43.9 42.6 43.1  PLT 107* 136* 134*  CREATININE 1.02 0.90 0.85    Estimated Creatinine Clearance: 100.6 mL/min (by C-G formula based on SCr of 0.85 mg/dL).   Medical History: Past Medical History:  Diagnosis Date  . Atrial fibrillation (HCC)   . Hyperlipidemia   . Hypertension   . Motion sickness    deep sea boats  . PPM-[employer] 10/11/2009   Qualifier: Diagnosis of  By: 12/11/2009, MD, Ladona Ridgel   . Sick sinus syndrome [facility])    s/p pacer  . Syncope and collapse   . Wears dentures    full upper and lower    Medications:  Rivaroxaban 20 mg daily (PTA for AF)  Assessment: 69 y/o M admitted for COVID-19 PNA. Patient is on rivaroxaban for a h/o AF. LE dopplers today revealed DVT. Pharmacy consulted to initiate Lovenox.   Goal of Therapy:  Anti-Xa level 0.6-1 units/ml 4hrs after LMWH dose given Monitor platelets by anticoagulation protocol: Yes   Plan:  Lovenox 1 mg/kg q 12 hours.  F/U renal funciton, CBC, s/s of bleeding  73 D 05/11/2019,2:12 PM

## 2019-05-11 NOTE — Progress Notes (Signed)
Chaplain responding to [facility] CARE CONSULT re: pt requesting prayer.  Introduced spiritual care with pt, who was welcoming of support.   Pt preparing for procedures.  Spiritual care will follow up with pt for continued support and prayers during admission.   Max Lozano, MDiv, [facility] Lead Clinical Chaplain  [facility], Behavioral [facility]

## 2019-05-11 NOTE — Progress Notes (Signed)
Lower extremity venous has been completed.   Preliminary results in CV Proc.   Blanch Media 05/11/2019 12:43 PM

## 2019-05-12 LAB — CBC WITH DIFFERENTIAL/PLATELET
Abs Immature Granulocytes: 0.4 10*3/uL — ABNORMAL HIGH (ref 0.00–0.07)
Basophils Absolute: 0.1 10*3/uL (ref 0.0–0.1)
Basophils Relative: 0 %
Eosinophils Absolute: 0 10*3/uL (ref 0.0–0.5)
Eosinophils Relative: 0 %
HCT: 43.5 % (ref 39.0–52.0)
Hemoglobin: 14.2 g/dL (ref 13.0–17.0)
Immature Granulocytes: 2 %
Lymphocytes Relative: 30 %
Lymphs Abs: 8.2 10*3/uL — ABNORMAL HIGH (ref 0.7–4.0)
MCH: 30.6 pg (ref 26.0–34.0)
MCHC: 32.6 g/dL (ref 30.0–36.0)
MCV: 93.8 fL (ref 80.0–100.0)
Monocytes Absolute: 1.2 10*3/uL — ABNORMAL HIGH (ref 0.1–1.0)
Monocytes Relative: 4 %
Neutro Abs: 17.4 10*3/uL — ABNORMAL HIGH (ref 1.7–7.7)
Neutrophils Relative %: 64 %
Platelets: 119 10*3/uL — ABNORMAL LOW (ref 150–400)
RBC: 4.64 MIL/uL (ref 4.22–5.81)
RDW: 13.7 % (ref 11.5–15.5)
WBC: 27.2 10*3/uL — ABNORMAL HIGH (ref 4.0–10.5)
nRBC: 0 % (ref 0.0–0.2)

## 2019-05-12 LAB — COMPREHENSIVE METABOLIC PANEL
ALT: 34 U/L (ref 0–44)
AST: 40 U/L (ref 15–41)
Albumin: 3.4 g/dL — ABNORMAL LOW (ref 3.5–5.0)
Alkaline Phosphatase: 131 U/L — ABNORMAL HIGH (ref 38–126)
Anion gap: 12 (ref 5–15)
BUN: 33 mg/dL — ABNORMAL HIGH (ref 8–23)
CO2: 22 mmol/L (ref 22–32)
Calcium: 8.6 mg/dL — ABNORMAL LOW (ref 8.9–10.3)
Chloride: 105 mmol/L (ref 98–111)
Creatinine, Ser: 0.77 mg/dL (ref 0.61–1.24)
GFR calc Af Amer: >60 mL/min (ref >60)
GFR calc non Af Amer: >60 mL/min (ref >60)
Glucose, Bld: 174 mg/dL — ABNORMAL HIGH (ref 70–99)
Potassium: 4.1 mmol/L (ref 3.5–5.1)
Sodium: 139 mmol/L (ref 135–145)
Total Bilirubin: 0.8 mg/dL (ref 0.3–1.2)
Total Protein: 6.3 g/dL — ABNORMAL LOW (ref 6.5–8.1)

## 2019-05-12 LAB — MRSA PCR SCREENING: MRSA by PCR: POSITIVE — AB

## 2019-05-12 LAB — C-REACTIVE PROTEIN: CRP: 0.8 mg/dL (ref <1.0)

## 2019-05-12 LAB — GLUCOSE, CAPILLARY
Glucose-Capillary: 107 mg/dL — ABNORMAL HIGH (ref 70–99)
Glucose-Capillary: 148 mg/dL — ABNORMAL HIGH (ref 70–99)
Glucose-Capillary: 132 mg/dL — ABNORMAL HIGH (ref 70–99)
Glucose-Capillary: 122 mg/dL — ABNORMAL HIGH (ref 70–99)

## 2019-05-12 LAB — CULTURE, BLOOD (ROUTINE X 2)
Culture: NO GROWTH
Culture: NO GROWTH
Special Requests: ADEQUATE
Special Requests: ADEQUATE

## 2019-05-12 LAB — PROCALCITONIN: Procalcitonin: <0.1 ng/mL

## 2019-05-12 LAB — PATHOLOGIST SMEAR REVIEW

## 2019-05-12 LAB — D-DIMER, QUANTITATIVE: D-Dimer, Quant: >20 ug/mL-FEU — ABNORMAL HIGH (ref 0.00–0.50)

## 2019-05-12 MED ORDER — DEXAMETHASONE SODIUM PHOSPHATE 10 MG/ML IJ SOLN
6.0000 mg | INTRAMUSCULAR | Status: DC
  Administered 2019-05-12 – 2019-05-18 (×7): 6 mg via INTRAVENOUS
  Filled 2019-05-12 (×7): qty 1

## 2019-05-12 MED ORDER — MUPIROCIN 2 % EX OINT
1.0000 "application " | TOPICAL_OINTMENT | Freq: Two times a day (BID) | CUTANEOUS | Status: AC
  Administered 2019-05-12 – 2019-05-17 (×9): 1 via NASAL
  Filled 2019-05-12: qty 22

## 2019-05-12 MED ORDER — CHLORHEXIDINE GLUCONATE CLOTH 2 % EX PADS
6.0000 | MEDICATED_PAD | Freq: Every day | CUTANEOUS | Status: AC
  Administered 2019-05-12 – 2019-05-16 (×5): 6 via TOPICAL

## 2019-05-12 NOTE — Progress Notes (Signed)
Pt states he is too anxious to take mask off

## 2019-05-12 NOTE — Progress Notes (Signed)
[facility] PROGRESS NOTE    Progress Note  Max Lozano  QIO:[id number] DOB: [birth year] DOA: 05/18/2019 PCP: Malva Limes, MD     Brief Narrative:   Max Lozano is an 69 y.o. male past medical history no nodal dysfunction chronic atrial fibrillation on Xarelto syncope and hypertension with morbid obesity comes into the hospital for 4 days of nonproductive cough and fevers tested positive for COVID-19 on April 25, 2020 2 [facility] ED was found to be satting in the 80s placed on 6 L chest x-ray showed bilateral infiltrates and SARS-CoV-2 was positive  Assessment/Plan:   Acute respiratory failure with hypoxia secondary to pneumonia due to COVID-19: Max Lozano is requiring 15 L of oxygen to keep saturations greater than 88%. He has completed his course of IV remdesivir, has received 2 doses of Actemra, will start to wean down steroids. Try to keep the patient prone for early 16 hours a day, if not prone out of bed to chair, continues into spirometry and flutter valve. Physical therapy evaluated the patient recommended home health PT. D-Dimer has been persistently greater than 20 and his CRP is unremarkable.  Lower extremity Doppler that showed new DVT, CT angio of the chest showed groundglass opacity bilateral but no PE. He has failed oral Xarelto (he was on Xarelto for atrial fibrillation) he has been switched to full dose Lovenox.  His D-dimer continues to be persistently greater than 20.  New right femoral vein and right posterior tibial vein DVT: By vascular Doppler on 05/11/2019, he has failed Xarelto, Xarelto has been discontinued. He was started on full dose Lovenox.  CT angio of the chest showed no PE but extensive groundglass attenuation throughout both lungs more prominent on the basis. Question of the rise in his leukocytosis likely due to his PE.  HYPERTENSION, BENIGN ESSENTIAL He was started on Norvasc, continue diltiazem and metoprolol and hydralazine as needed as  needed blood pressures fairly controlled.  Permanent atrial fibrillation (HCC)/Sinoatrial node dysfunction (HCC) Rate control on diltiazem and metoprolol, continue Xarelto he is currently asymptomatic.  Uncontrolled diabetes mellitus type 2 without complications: His blood glucose  is improving, he is on high-dose long-acting insulin plus sliding scale resistant base. He is tolerating his diet.  DVT prophylaxis: lovenox Family Communication:none Disposition Plan/Barrier to D/C: unable to determine Code Status:     Code Status Orders  (From admission, onward)         Start     Ordered   18-May-2019 1339  Full code  Continuous     05-18-2019 1341        Code Status History    This patient has a current code status but no historical code status.   Advance Care Planning Activity        IV Access:    Peripheral IV   Procedures and diagnostic studies:   CT ANGIO CHEST PE W OR WO CONTRAST  Result Date: 05/11/2019 CLINICAL DATA:  Dyspnea on exertion. Hypoxia. COVID-19 pneumonia. Currently being treated with remdesivir and steroids. EXAM: CT ANGIOGRAPHY CHEST WITH CONTRAST TECHNIQUE: Multidetector CT imaging of the chest was performed using the standard protocol during bolus administration of intravenous contrast. Multiplanar CT image reconstructions and MIPs were obtained to evaluate the vascular anatomy. CONTRAST:  OMNIPAQUE IOHEXOL 350 MG/ML SOLN COMPARISON:  One-view chest x-ray 05/07/2019 FINDINGS: Cardiovascular: The heart mildly enlarged. Pacing wires are in place. Coronary artery calcifications are present. Aortic arch great vessel origins are within normal limits. Pulmonary artery  opacification is excellent. No focal filling defects are present to suggest pulmonary emboli. Mediastinum/Nodes: Subcentimeter mediastinal lymph nodes are likely reactive. Small hiatal hernia is present. Esophagus is otherwise unremarkable. Thoracic inlet is within normal limits. Water density  the collection adjacent to the right atrium is consistent with a benign pericardial cyst. Lungs/Pleura: Extensive ground-glass attenuation is present throughout both lungs, most prominent at the lung bases. No significant pleural disease or pneumothorax is present. There are no significant effusions. Upper Abdomen: There is diffuse fatty infiltration of the liver. No solid organ lesions are present. No significant retroperitoneal adenopathy is present. Musculoskeletal: Degenerative changes are present in the thoracic spine. Fused anterior osteophytes are noted throughout the lower thoracic spine. Vertebral body heights are maintained. No focal lytic or blastic lesions are present. Review of the MIP images confirms the above findings. IMPRESSION: 1. No evidence for pulmonary embolus. 2. Mild cardiomegaly without failure. 3. Extensive ground-glass attenuation throughout both lungs, most prominent at the lung bases. Findings are most concerning for multifocal pneumonia in this patient with known COVID-19 disease. 4. Small hiatal hernia. 5. Hepatic steatosis. Electronically Signed   By: San Morelle M.D.   On: 05/11/2019 13:03   VAS [place] LOWER EXTREMITY VENOUS (DVT)  Result Date: 05/11/2019  Lower Venous Study Indications: D dimer.  Performing Technologist: Abram Sander RVS  Examination Guidelines: A complete evaluation includes B-mode imaging, spectral Doppler, color Doppler, and power Doppler as needed of all accessible portions of each vessel. Bilateral testing is considered an integral part of a complete examination. Limited examinations for reoccurring indications may be performed as noted.  +---------+---------------+---------+-----------+----------+-----------------+ RIGHT    CompressibilityPhasicitySpontaneityPropertiesThrombus Aging    +---------+---------------+---------+-----------+----------+-----------------+ CFV      Full           Yes      Yes                                     +---------+---------------+---------+-----------+----------+-----------------+ SFJ      Full                                                           +---------+---------------+---------+-----------+----------+-----------------+ FV Prox  Full                                                           +---------+---------------+---------+-----------+----------+-----------------+ FV Mid   Full                                                           +---------+---------------+---------+-----------+----------+-----------------+ FV DistalNone                                         Age Indeterminate +---------+---------------+---------+-----------+----------+-----------------+ PFV      Full                                                           +---------+---------------+---------+-----------+----------+-----------------+  POP      Full           Yes      Yes                                    +---------+---------------+---------+-----------+----------+-----------------+ PTV      None                                         Age Indeterminate +---------+---------------+---------+-----------+----------+-----------------+ PERO                                                  Not visualized    +---------+---------------+---------+-----------+----------+-----------------+   +---------+---------------+---------+-----------+----------+--------------+ LEFT     CompressibilityPhasicitySpontaneityPropertiesThrombus Aging +---------+---------------+---------+-----------+----------+--------------+ CFV      Full           Yes      Yes                                 +---------+---------------+---------+-----------+----------+--------------+ SFJ      Full                                                        +---------+---------------+---------+-----------+----------+--------------+ FV Prox  Full                                                         +---------+---------------+---------+-----------+----------+--------------+ FV Mid   Full                                                        +---------+---------------+---------+-----------+----------+--------------+ FV DistalFull                                                        +---------+---------------+---------+-----------+----------+--------------+ PFV      Full                                                        +---------+---------------+---------+-----------+----------+--------------+ POP      Full           Yes      Yes                                 +---------+---------------+---------+-----------+----------+--------------+ PTV  Full                                                        +---------+---------------+---------+-----------+----------+--------------+ PERO                                                  Not visualized +---------+---------------+---------+-----------+----------+--------------+     Summary: Right: Findings consistent with age indeterminate deep vein thrombosis involving the right femoral vein, and right posterior tibial veins. No cystic structure found in the popliteal fossa. Left: There is no evidence of deep vein thrombosis in the lower extremity. No cystic structure found in the popliteal fossa.  *See table(s) above for measurements and observations. Electronically signed by Fabienne Brunsharles Fields MD on 05/11/2019 at 4:43:42 PM.    Final      Medical Consultants:    None.  Anti-Infectives:   IV remdesivir  Subjective:    Allayne Stackerry L Runions relates his breathing is about the same as yesterday.  Objective:    Vitals:   05/12/19 0413 05/12/19 0720 05/12/19 0848 05/12/19 0911  BP: (!) 164/70 133/79    Pulse: (!) 59 60 64 62  Resp: 18 (!) 30 (!) 28   Temp: 98 F (36.7 C) 97.8 F (36.6 C)    TempSrc:  Axillary    SpO2: 91% 95% (!) 86%   Height:       SpO2: (!) 86 % O2 Flow Rate (L/min): 15 L/min(15L  HFNC/15L NRB)   Intake/Output Summary (Last 24 hours) at 05/12/2019 1132 Last data filed at 05/12/2019 0900 Gross per 24 hour  Intake 655.72 ml  Output 550 ml  Net 105.72 ml   There were no vitals filed for this visit.  Exam: General exam: In no acute distress. Respiratory system: Good air movement and diffuse crackles bilaterally. Cardiovascular system: S1 & S2 heard, RRR. No JVD. Gastrointestinal system: Abdomen is nondistended, soft and nontender.  Central nervous system: Alert and oriented. No focal neurological deficits. Extremities: No pedal edema. Skin: No rashes, lesions or ulcers Psychiatry: Judgement and insight appear normal. Mood & affect appropriate.     Data Reviewed:    Labs: Basic Metabolic Panel: Recent Labs  Lab 05/07/19 2239 05/29/2019 1420 05/09/19 0217 05/10/19 0119 05/11/19 0142 05/12/19 0130  NA 135  --  140 140 142 139  K 4.1  --  5.2* 4.2 4.5 4.1  CL 102  --  105 106 108 105  CO2 23  --  25 24 24 22   GLUCOSE 229*  --  337* 278* 209* 174*  BUN 21  --  31* 37* 34* 33*  CREATININE 1.03 1.03 1.02 0.90 0.85 0.77  CALCIUM 8.6*  --  9.1 8.8* 8.9 8.6*   GFR Estimated Creatinine Clearance: 106.9 mL/min (by C-G formula based on SCr of 0.77 mg/dL). Liver Function Tests: Recent Labs  Lab 05/07/19 2239 05/09/19 0217 05/10/19 0119 05/11/19 0142 05/12/19 0130  AST 44* 36 37 40 40  ALT 28 28 28 31  34  ALKPHOS 64 65 80 110 131*  BILITOT 1.0 0.7 0.4 0.7 0.8  PROT 7.5 6.7 6.7 6.4* 6.3*  ALBUMIN 3.7 3.2* 3.3* 3.3* 3.4*   No results for input(s):  LIPASE, AMYLASE in the last 168 hours. No results for input(s): AMMONIA in the last 168 hours. Coagulation profile No results for input(s): INR, PROTIME in the last 168 hours. COVID-19 Labs  Recent Labs    05/10/19 0935 05/10/19 1000 05/11/19 0142 05/12/19 0130  DDIMER >20.00*  --  >20.00* >20.00*  CRP  --  2.0* 1.2* 0.8    Lab Results  Component Value Date   SARSCOV2NAA Not Detected 12/08/2018      CBC: Recent Labs  Lab 05/07/19 2239 05/11/2019 1420 05/09/19 0217 05/10/19 0119 05/11/19 0142 05/12/19 0130  WBC 7.0 7.2 7.8 17.2* 23.0* 27.2*  NEUTROABS 4.5  --  4.5 11.8* 15.5* 17.4*  HGB 14.8 14.9 14.1 13.9 13.9 14.2  HCT 42.7 46.7 43.9 42.6 43.1 43.5  MCV 88.8 95.1 94.6 94.5 93.3 93.8  PLT 97* 108* 107* 136* 134* 119*   Cardiac Enzymes: No results for input(s): CKTOTAL, CKMB, CKMBINDEX, TROPONINI in the last 168 hours. BNP (last 3 results) No results for input(s): PROBNP in the last 8760 hours. CBG: Recent Labs  Lab 05/11/19 0746 05/11/19 1139 05/11/19 1646 05/11/19 1939 05/12/19 0733  GLUCAP 232* 268* 218* 273* 107*   D-Dimer: Recent Labs    05/11/19 0142 05/12/19 0130  DDIMER >20.00* >20.00*   Hgb A1c: No results for input(s): HGBA1C in the last 72 hours. Lipid Profile: No results for input(s): CHOL, HDL, LDLCALC, TRIG, CHOLHDL, LDLDIRECT in the last 72 hours. Thyroid function studies: No results for input(s): TSH, T4TOTAL, T3FREE, THYROIDAB in the last 72 hours.  Invalid input(s): FREET3 Anemia work up: No results for input(s): VITAMINB12, FOLATE, FERRITIN, TIBC, IRON, RETICCTPCT in the last 72 hours. Sepsis Labs: Recent Labs  Lab 05/07/19 2239 05/07/19 2333 05/09/19 0217 05/10/19 0119 05/10/19 0935 05/11/19 0142 05/12/19 0130  PROCALCITON <0.10  --   --   --  <0.10 <0.10 <0.10  WBC 7.0  --  7.8 17.2*  --  23.0* 27.2*  LATICACIDVEN 1.7 1.5  --   --   --   --   --    Microbiology Recent Results (from the past 240 hour(s))  Blood Culture (routine x 2)     Status: None   Collection Time: 05/07/19 11:33 PM   Specimen: BLOOD  Result Value Ref Range Status   Specimen Description BLOOD RIGHT ANTECUBITAL  Final   Special Requests   Final    BOTTLES DRAWN AEROBIC AND ANAEROBIC Blood Culture adequate volume   Culture   Final    NO GROWTH 5 DAYS Performed at [facility], [address], [place], [place] [address]    Report Status  05/12/2019 FINAL  Final  Blood Culture (routine x 2)     Status: None   Collection Time: 05/07/19 11:33 PM   Specimen: BLOOD  Result Value Ref Range Status   Specimen Description BLOOD LEFT ANTECUBITAL  Final   Special Requests   Final    BOTTLES DRAWN AEROBIC AND ANAEROBIC Blood Culture adequate volume   Culture   Final    NO GROWTH 5 DAYS Performed at [facility], [address] [place], [place] [address]    Report Status 05/12/2019 FINAL  Final     Medications:   . amLODipine  5 mg Oral Daily  . vitamin C  500 mg Oral Daily  . atorvastatin  40 mg Oral q1800  . dexamethasone (DECADRON) injection  6 mg Intravenous Q24H  . diltiazem  240 mg Oral Daily  . enoxaparin (LOVENOX)  injection  1 mg/kg Subcutaneous Q12H  . influenza vaccine adjuvanted  0.5 mL Intramuscular Tomorrow-1000  . insulin aspart  0-20 Units Subcutaneous TID WC  . insulin aspart  0-5 Units Subcutaneous QHS  . insulin aspart  15 Units Subcutaneous TID WC  . insulin detemir  55 Units Subcutaneous BID  . Ipratropium-Albuterol  2 puff Inhalation Q6H  . pneumococcal 23 valent vaccine  0.5 mL Intramuscular Tomorrow-1000  . propranolol ER  80 mg Oral Daily  . zinc sulfate  220 mg Oral Daily   Continuous Infusions: . ceFEPime (MAXIPIME) IV 2 g (05/12/19 0413)  . vancomycin Stopped (05/12/19 0700)     LOS: 4 days   Marinda Elk  [facility]  05/12/2019, 11:32 AM

## 2019-05-12 NOTE — Progress Notes (Signed)
   05/12/19 0720  Vitals  Temp 97.8 F (36.6 C)  Temp Source Axillary  BP 133/79  MAP (mmHg) 96  BP Location Right Arm  BP Method Automatic  Patient Position (if appropriate) Lying  Pulse Rate 60  Pulse Rate Source Monitor  Resp (!) 30  Level of Consciousness  Level of Consciousness Alert  Oxygen Therapy  SpO2 95 %  O2 Device Non-rebreather Mask;HFNC  O2 Flow Rate (L/min) 15 L/min (HFNC and NRB at 15L/min)  Patient Activity (if Appropriate) In bed  Pulse Oximetry Type Continuous  Oximetry Probe Site Changed Yes  Pain Assessment  Pain Scale 0-10  Pain Score 0  MEWS Score  MEWS RR 2  MEWS Pulse 0  MEWS Systolic 0  MEWS LOC 0  MEWS Temp 0  MEWS Score 2  MEWS Score Color Yellow  MEWS Assessment  Is this an acute change? Yes (change in MEWS.  RR up. o2 needs unchanged however)  MEWS guidelines implemented *See Row Information* Yellow  Provider Notification  Provider Name/Title Dr. David Stall  Date Provider Notified 05/12/19  Time Provider Notified 0725  Notification Type Page  Notification Reason Change in status (MEWS yellow)  Response No new orders  Date of Provider Response 05/12/19  Time of Provider Response [phone]

## 2019-05-12 NOTE — Progress Notes (Signed)
CRITICAL VALUE ALERT  Critical Value:  Positive MRSA Nare   Date & Time Notied:  1620 05/12/19   Provider Notified: David Stall   Orders Received/Actions taken: Follow protocol. Daily CHG baths & bactroban in nares

## 2019-05-12 NOTE — Progress Notes (Signed)
   05/12/19 1235  Family/Significant Other Communication  Family/Significant Other Update Called;Updated;Other (Comment) [place] Citizen, Wife, [phone])    Wife requesting MD to call with daily updates at his convenience.

## 2019-05-12 NOTE — Progress Notes (Signed)
Physical Therapy Treatment Patient Details Name: Max Lozano MRN: [id number] DOB: December 24, 1950 Today's Date: 05/12/2019    History of Present Illness 69 y.o. male past medical history no nodal dysfunction chronic atrial fibrillation on Xarelto syncope and hypertension with morbid obesity comes into the hospital for 4 days of nonproductive cough and fevers tested positive for COVID-19 on April 25, 2020 2 [facility] ED was found to be satting in the 80s placed on 6 L chest x-ray showed bilateral infiltrates and SARS-CoV-2 was positive    PT Comments    Pt is highly motivated to participate and improve so he can go home. He is needing increased 02 today at 15L/min via HFNC and 15L via NRB, sats remain in 90s with exception of after ambulation when monitor briefly read 83% but pt immediately increased back up to high 80s and into 90s again. Completed seated there ex for UE with thera-band and BLE without, ambulated approx 17ft with SBA and no AD. As pt is needing increased 02 therapist is recommedning home health PT at dc.   Follow Up Recommendations  Home health PT     Equipment Recommendations  Other (comment)(02 pending progress)    Recommendations for Other Services       Precautions / Restrictions Precautions Precautions: Other (comment)(increased 02 needs for activity) Restrictions Weight Bearing Restrictions: No    Mobility  Bed Mobility               General bed mobility comments: pt sitting in recliner at therapist arrival to room  Transfers Overall transfer level: Needs assistance Equipment used: None Transfers: Sit to/from Stand Sit to Stand: Supervision            Ambulation/Gait Ambulation/Gait assistance: Supervision Gait Distance (Feet): 60 Feet Assistive device: None Gait Pattern/deviations: Step-through pattern Gait velocity: reduced   General Gait Details: ambulated 1 x 60 ft today with no AD and SBA, on 15L via HFNC and 15 L NRB. monitor reads  min desat to 83% but very briefly and increased back to 90s again.   Stairs             Wheelchair Mobility    Modified Rankin (Stroke Patients Only)       Balance Overall balance assessment: Mild deficits observed, not formally tested                                          Cognition Arousal/Alertness: Awake/alert Behavior During Therapy: WFL for tasks assessed/performed Overall Cognitive Status: No family/caregiver present to determine baseline cognitive functioning                                 General Comments: seems to be at baseline functioning      Exercises General Exercises - Upper Extremity Shoulder Flexion: AROM;Strengthening;10 reps;Theraband Theraband Level (Shoulder Flexion): Level 2 (Red) Shoulder Horizontal ABduction: AROM;Strengthening;Both;10 reps;Theraband Theraband Level (Shoulder Horizontal Abduction): Level 2 (Red) Elbow Flexion: AROM;Strengthening;10 reps;Theraband Theraband Level (Elbow Flexion): Level 2 (Red) Elbow Extension: AROM;Strengthening;10 reps;Theraband Theraband Level (Elbow Extension): Level 2 (Red) General Exercises - Lower Extremity Ankle Circles/Pumps: AROM;Both;10 reps Long Arc Quad: AROM;Both;10 reps Hip Flexion/Marching: AROM;Both;10 reps Other Exercises Other Exercises: flutter valve  Other Exercises: sit<>stand x 5    General Comments        Pertinent Vitals/Pain Pain Assessment: No/denies  pain    Home Living                      Prior Function            PT Goals (current goals can now be found in the care plan section) Acute Rehab PT Goals Patient Stated Goal: states is ready to get moving so he can go home PT Goal Formulation: With patient Time For Goal Achievement: 2019-06-19 Potential to Achieve Goals: Good Progress towards PT goals: Progressing toward goals    Frequency    Min 3X/week      PT Plan Discharge plan needs to be updated     Co-evaluation              AM-PAC PT "6 Clicks" Mobility   Outcome Measure  Help needed turning from your back to your side while in a flat bed without using bedrails?: None Help needed moving from lying on your back to sitting on the side of a flat bed without using bedrails?: None Help needed moving to and from a bed to a chair (including a wheelchair)?: A Little Help needed standing up from a chair using your arms (e.g., wheelchair or bedside chair)?: A Little Help needed to walk in hospital room?: A Little Help needed climbing 3-5 steps with a railing? : A Lot 6 Click Score: 19    End of Session Equipment Utilized During Treatment: Oxygen Activity Tolerance: Treatment limited secondary to medical complications (Comment) Patient left: in chair;with call bell/phone within reach Nurse Communication: Mobility status PT Visit Diagnosis: Other abnormalities of gait and mobility (R26.89)     Time: 7062-3762 PT Time Calculation (min) (ACUTE ONLY): 40 min  Charges:  $Gait Training: 8-22 mins $Therapeutic Exercise: 8-22 mins $Therapeutic Activity: 8-22 mins                     Horald Chestnut, PT    Delford Field 05/12/2019, 12:51 PM

## 2019-05-12 NOTE — Progress Notes (Signed)
   05/12/19 1214  Family/Significant Other Communication  Family/Significant Other Update Called;Updated;Other (Comment) Wilkie Aye, daughter, [phone])

## 2019-05-13 LAB — GLUCOSE, CAPILLARY
Glucose-Capillary: 45 mg/dL — ABNORMAL LOW (ref 70–99)
Glucose-Capillary: 70 mg/dL (ref 70–99)
Glucose-Capillary: 215 mg/dL — ABNORMAL HIGH (ref 70–99)
Glucose-Capillary: 307 mg/dL — ABNORMAL HIGH (ref 70–99)
Glucose-Capillary: 308 mg/dL — ABNORMAL HIGH (ref 70–99)

## 2019-05-13 LAB — COMPREHENSIVE METABOLIC PANEL
ALT: 57 U/L — ABNORMAL HIGH (ref 0–44)
AST: 67 U/L — ABNORMAL HIGH (ref 15–41)
Albumin: 3.4 g/dL — ABNORMAL LOW (ref 3.5–5.0)
Alkaline Phosphatase: 153 U/L — ABNORMAL HIGH (ref 38–126)
Anion gap: 10 (ref 5–15)
BUN: 30 mg/dL — ABNORMAL HIGH (ref 8–23)
CO2: 23 mmol/L (ref 22–32)
Calcium: 8.5 mg/dL — ABNORMAL LOW (ref 8.9–10.3)
Chloride: 109 mmol/L (ref 98–111)
Creatinine, Ser: 0.68 mg/dL (ref 0.61–1.24)
GFR calc Af Amer: >60 mL/min (ref >60)
GFR calc non Af Amer: >60 mL/min (ref >60)
Glucose, Bld: 60 mg/dL — ABNORMAL LOW (ref 70–99)
Potassium: 4.3 mmol/L (ref 3.5–5.1)
Sodium: 142 mmol/L (ref 135–145)
Total Bilirubin: 1.1 mg/dL (ref 0.3–1.2)
Total Protein: 6.3 g/dL — ABNORMAL LOW (ref 6.5–8.1)

## 2019-05-13 LAB — CBC WITH DIFFERENTIAL/PLATELET
Abs Immature Granulocytes: 0.75 10*3/uL — ABNORMAL HIGH (ref 0.00–0.07)
Basophils Absolute: 0.1 10*3/uL (ref 0.0–0.1)
Basophils Relative: 0 %
Eosinophils Absolute: 0 10*3/uL (ref 0.0–0.5)
Eosinophils Relative: 0 %
HCT: 45.4 % (ref 39.0–52.0)
Hemoglobin: 14.9 g/dL (ref 13.0–17.0)
Immature Granulocytes: 2 %
Lymphocytes Relative: 42 %
Lymphs Abs: 15.4 10*3/uL — ABNORMAL HIGH (ref 0.7–4.0)
MCH: 30.6 pg (ref 26.0–34.0)
MCHC: 32.8 g/dL (ref 30.0–36.0)
MCV: 93.2 fL (ref 80.0–100.0)
Monocytes Absolute: 1 10*3/uL (ref 0.1–1.0)
Monocytes Relative: 3 %
Neutro Abs: 19.4 10*3/uL — ABNORMAL HIGH (ref 1.7–7.7)
Neutrophils Relative %: 53 %
Platelets: 146 10*3/uL — ABNORMAL LOW (ref 150–400)
RBC: 4.87 MIL/uL (ref 4.22–5.81)
RDW: 13.9 % (ref 11.5–15.5)
WBC: 36.8 10*3/uL — ABNORMAL HIGH (ref 4.0–10.5)
nRBC: 0.1 % (ref 0.0–0.2)

## 2019-05-13 LAB — C-REACTIVE PROTEIN: CRP: 0.5 mg/dL (ref <1.0)

## 2019-05-13 LAB — D-DIMER, QUANTITATIVE: D-Dimer, Quant: 15.77 ug/mL-FEU — ABNORMAL HIGH (ref 0.00–0.50)

## 2019-05-13 MED ORDER — INSULIN DETEMIR 100 UNIT/ML ~~LOC~~ SOLN
40.0000 [IU] | Freq: Two times a day (BID) | SUBCUTANEOUS | Status: DC
  Administered 2019-05-13 (×2): 40 [IU] via SUBCUTANEOUS
  Filled 2019-05-13 (×3): qty 0.4

## 2019-05-13 MED ORDER — INSULIN ASPART 100 UNIT/ML ~~LOC~~ SOLN
12.0000 [IU] | Freq: Three times a day (TID) | SUBCUTANEOUS | Status: DC
  Administered 2019-05-13 (×2): 12 [IU] via SUBCUTANEOUS

## 2019-05-13 NOTE — Progress Notes (Signed)
PROGRESS NOTE    Max Lozano  ZOX:[id number] DOB: 06/05/1950 DOA: 2019/05/21 PCP: Malva Limes, MD    Brief Narrative:  69 year old gentleman with history of chronic atrial fibrillation on Xarelto, sick sinus syndrome status post pacemaker, history of syncope and hypertension presented to the emergency room with 4 days of nonproductive cough and fever after diagnosis of COVID-19 on April 26, 2019.  Initially saturating 80% on room air.  Initially required 6 L of nasal cannula oxygen.   Assessment & Plan:   Principal Problem:   Pneumonia due to COVID-19 virus Active Problems:   Hyperlipidemia   HYPERTENSION, BENIGN ESSENTIAL   Permanent atrial fibrillation (HCC)   Sinoatrial node dysfunction (HCC)   Acute respiratory failure with hypoxia (HCC)  Pneumonia due to COVID-19 virus, acute hypoxemic respiratory failure due to COVID-19: Continue to monitor due to significant symptoms, still on significant oxygen requirement. chest physiotherapy, incentive spirometry, deep breathing exercises, sputum induction, mucolytic's and bronchodilators. Supplemental oxygen to keep saturations more than 89% Covid directed therapy with , steroids, dexamethasone. remdesivir, finished 5 days of therapy. actemra, received 2 subsequent doses on 1/1, 1/2. antibiotics, treated with vancomycin and cefepime. For greatly elevated WBC count and significant hypoxia. Due to severity of symptoms, patient will need daily inflammatory markers, chest x-rays, liver function test to monitor and direct COVID-19 therapies and pneumonia.  New right femoral vein and right posterior tibial vein DVT on Xarelto: Found to have new DVT while therapeutic on Xarelto.  CT scan of the chest showed no evidence of pulmonary embolism. Xarelto stopped, started on therapeutic Lovenox that we will continue until clinical improvement.  Permanent A. Fib/sinoatrial node dysfunction with pacemaker: Stable.  Rate controlled on Cardizem  and metoprolol.  Hypertension: Blood pressure stable on Norvasc and diltiazem.  Uncontrolled type 2 diabetes with hypoglycemia: Newly diagnosed diabetes. Not on treatment at home.  Hemoglobin A1c 8.5. Hypoglycemic event on high-dose insulin 05/13/2019. Good appetite and intake. Reduce dose of long-acting insulin and prandial insulin today.  DVT prophylaxis: Lovenox subcu Code Status: Full code Family Communication: None Disposition Plan: Remains critically sick.   Consultants:   None  Procedures:   None  Antimicrobials:  Antibiotics Given (last 72 hours)    Date/Time Action Medication Dose Rate   05/11/19 0914 New Bag/Given   remdesivir 100 mg in sodium chloride 0.9 % 100 mL IVPB 100 mg 200 mL/hr   05/11/19 1229 New Bag/Given   ceFEPIme (MAXIPIME) 2 g in sodium chloride 0.9 % 100 mL IVPB 2 g 200 mL/hr   05/11/19 1312 New Bag/Given   vancomycin (VANCOREADY) IVPB 2000 mg/400 mL 2,000 mg 200 mL/hr   05/11/19 1947 New Bag/Given   ceFEPIme (MAXIPIME) 2 g in sodium chloride 0.9 % 100 mL IVPB 2 g 200 mL/hr   05/12/19 0025 New Bag/Given   vancomycin (VANCOCIN) IVPB 1000 mg/200 mL premix 1,000 mg 200 mL/hr   05/12/19 0413 New Bag/Given   ceFEPIme (MAXIPIME) 2 g in sodium chloride 0.9 % 100 mL IVPB 2 g 200 mL/hr   05/12/19 1206 New Bag/Given   ceFEPIme (MAXIPIME) 2 g in sodium chloride 0.9 % 100 mL IVPB 2 g 200 mL/hr   05/12/19 1412 New Bag/Given   vancomycin (VANCOCIN) IVPB 1000 mg/200 mL premix 1,000 mg 200 mL/hr   05/12/19 2100 New Bag/Given   ceFEPIme (MAXIPIME) 2 g in sodium chloride 0.9 % 100 mL IVPB 2 g 200 mL/hr   05/13/19 0003 New Bag/Given   vancomycin (VANCOCIN) IVPB 1000 mg/200  mL premix 1,000 mg 200 mL/hr   05/13/19 0401 New Bag/Given   ceFEPIme (MAXIPIME) 2 g in sodium chloride 0.9 % 100 mL IVPB 2 g 200 mL/hr   05/13/19 1232 New Bag/Given   ceFEPIme (MAXIPIME) 2 g in sodium chloride 0.9 % 100 mL IVPB 2 g 200 mL/hr   05/13/19 1311 New Bag/Given   vancomycin  (VANCOCIN) IVPB 1000 mg/200 mL premix 1,000 mg 200 mL/hr         Subjective: Patient seen and examined.  No overnight events.  Still remains on significant oxygen and high oxygen requirement with mobility.  Objective: Vitals:   05/13/19 0936 05/13/19 1104 05/13/19 1120 05/13/19 1224  BP:  (!) 151/75 (!) 151/75   Pulse: 61 60 61 (!) 59  Resp:  (!) 26 (!) 26 (!) 22  Temp:  97.8 F (36.6 C)    TempSrc:  Oral    SpO2:  93% 94% 92%  Height:        Intake/Output Summary (Last 24 hours) at 05/13/2019 1409 Last data filed at 05/13/2019 0901 Gross per 24 hour  Intake 1320 ml  Output 450 ml  Net 870 ml   There were no vitals filed for this visit.  Examination:  General exam: Appears in mild respiratory distress with high oxygen demand. Respiratory system: Clear to auscultation. Respiratory effort normal.  No added sounds. Cardiovascular system: S1 & S2 heard, RRR.  Pacemaker in place. Gastrointestinal system: Abdomen is nondistended, soft and nontender.  Central nervous system: Alert and oriented. No focal neurological deficits. Extremities: Symmetric 5 x 5 power. Skin: No rashes, lesions or ulcers Psychiatry: Judgement and insight appear normal. Mood & affect appropriate.     Data Reviewed: I have personally reviewed following labs and imaging studies  CBC: Recent Labs  Lab 05/09/19 0217 05/10/19 0119 05/11/19 0142 05/12/19 0130 05/13/19 0245  WBC 7.8 17.2* 23.0* 27.2* 36.8*  NEUTROABS 4.5 11.8* 15.5* 17.4* 19.4*  HGB 14.1 13.9 13.9 14.2 14.9  HCT 43.9 42.6 43.1 43.5 45.4  MCV 94.6 94.5 93.3 93.8 93.2  PLT 107* 136* 134* 119* 146*   Basic Metabolic Panel: Recent Labs  Lab 05/09/19 0217 05/10/19 0119 05/11/19 0142 05/12/19 0130 05/13/19 0245  NA 140 140 142 139 142  K 5.2* 4.2 4.5 4.1 4.3  CL 105 106 108 105 109  CO2 25 24 24 22 23   GLUCOSE 337* 278* 209* 174* 60*  BUN 31* 37* 34* 33* 30*  CREATININE 1.02 0.90 0.85 0.77 0.68  CALCIUM 9.1 8.8* 8.9 8.6*  8.5*   GFR: Estimated Creatinine Clearance: 106.9 mL/min (by C-G formula based on SCr of 0.68 mg/dL). Liver Function Tests: Recent Labs  Lab 05/09/19 0217 05/10/19 0119 05/11/19 0142 05/12/19 0130 05/13/19 0245  AST 36 37 40 40 67*  ALT 28 28 31  34 57*  ALKPHOS 65 80 110 131* 153*  BILITOT 0.7 0.4 0.7 0.8 1.1  PROT 6.7 6.7 6.4* 6.3* 6.3*  ALBUMIN 3.2* 3.3* 3.3* 3.4* 3.4*   No results for input(s): LIPASE, AMYLASE in the last 168 hours. No results for input(s): AMMONIA in the last 168 hours. Coagulation Profile: No results for input(s): INR, PROTIME in the last 168 hours. Cardiac Enzymes: No results for input(s): CKTOTAL, CKMB, CKMBINDEX, TROPONINI in the last 168 hours. BNP (last 3 results) No results for input(s): PROBNP in the last 8760 hours. HbA1C: No results for input(s): HGBA1C in the last 72 hours. CBG: Recent Labs  Lab 05/12/19 2056 05/13/19 0725 05/13/19 07/11/19  05/13/19 0946 05/13/19 1124  GLUCAP 122* 45* 70 215* 307*   Lipid Profile: No results for input(s): CHOL, HDL, LDLCALC, TRIG, CHOLHDL, LDLDIRECT in the last 72 hours. Thyroid Function Tests: No results for input(s): TSH, T4TOTAL, FREET4, T3FREE, THYROIDAB in the last 72 hours. Anemia Panel: No results for input(s): VITAMINB12, FOLATE, FERRITIN, TIBC, IRON, RETICCTPCT in the last 72 hours. Sepsis Labs: Recent Labs  Lab 05/07/19 2239 05/07/19 2333 05/10/19 0935 05/11/19 0142 05/12/19 0130  PROCALCITON <0.10  --  <0.10 <0.10 <0.10  LATICACIDVEN 1.7 1.5  --   --   --     Recent Results (from the past 240 hour(s))  Blood Culture (routine x 2)     Status: None   Collection Time: 05/07/19 11:33 PM   Specimen: BLOOD  Result Value Ref Range Status   Specimen Description BLOOD RIGHT ANTECUBITAL  Final   Special Requests   Final    BOTTLES DRAWN AEROBIC AND ANAEROBIC Blood Culture adequate volume   Culture   Final    NO GROWTH 5 DAYS Performed at [facility], [address]  [place], [place] [address]    Report Status 05/12/2019 FINAL  Final  Blood Culture (routine x 2)     Status: None   Collection Time: 05/07/19 11:33 PM   Specimen: BLOOD  Result Value Ref Range Status   Specimen Description BLOOD LEFT ANTECUBITAL  Final   Special Requests   Final    BOTTLES DRAWN AEROBIC AND ANAEROBIC Blood Culture adequate volume   Culture   Final    NO GROWTH 5 DAYS Performed at [facility], [address] [place], [place] [address]    Report Status 05/12/2019 FINAL  Final  MRSA PCR Screening     Status: Abnormal   Collection Time: 05/12/19  9:10 AM   Specimen: Nasopharyngeal  Result Value Ref Range Status   MRSA by PCR POSITIVE (A) NEGATIVE Final    Comment:        The GeneXpert MRSA Assay (FDA approved for NASAL specimens only), is one component of a comprehensive MRSA colonization surveillance program. It is not intended to diagnose MRSA infection nor to guide or monitor treatment for MRSA infections. RESULT CALLED TO, READ BACK BY AND VERIFIED WITH: DOBSON,RN ON 05/12/19 @ 1515 BY LE Performed at [facility], [place] [address], [place], [place] [address]          Radiology Studies: No results found.      Scheduled Meds: . amLODipine  5 mg Oral Daily  . vitamin C  500 mg Oral Daily  . atorvastatin  40 mg Oral q1800  . Chlorhexidine Gluconate Cloth  6 each Topical Q0600  . dexamethasone (DECADRON) injection  6 mg Intravenous Q24H  . diltiazem  240 mg Oral Daily  . enoxaparin (LOVENOX) injection  1 mg/kg Subcutaneous Q12H  . influenza vaccine adjuvanted  0.5 mL Intramuscular Tomorrow-1000  . insulin aspart  0-20 Units Subcutaneous TID WC  . insulin aspart  0-5 Units Subcutaneous QHS  . insulin aspart  12 Units Subcutaneous TID WC  . insulin detemir  40 Units Subcutaneous BID  . Ipratropium-Albuterol  2 puff Inhalation Q6H  . mupirocin ointment  1 application Nasal BID  . pneumococcal 23 valent vaccine  0.5 mL  Intramuscular Tomorrow-1000  . propranolol ER  80 mg Oral Daily  . zinc sulfate  220 mg Oral Daily   Continuous Infusions: . ceFEPime (MAXIPIME) IV Stopped (05/13/19 1309)  .  vancomycin 1,000 mg (05/13/19 1311)     LOS: 5 days    Time spent: 25 minutes    Dorcas Carrow, MD [facility] Pager [phone]

## 2019-05-13 NOTE — Progress Notes (Signed)
Spoke to wife Rayfield Citizen with an update on plan of care. Will set up patient's iphone to facetime wife later this evening.

## 2019-05-13 NOTE — Progress Notes (Signed)
   05/13/19 1623  Family/Significant Other Communication  Family/Significant Other Update Called;Updated;Other (Comment) (Wife, Rayfield Citizen, [phone])   Update phone call was unanswered.  Phone call went straight to voicemail, with patient wife's voicemail box being full.  RN unable to leave voicemail.

## 2019-05-13 NOTE — Progress Notes (Signed)
CRITICAL VALUE ALERT  Critical Value:  bloodsugar 45  Date & Time Notied:  1-6 07 35  Provider Notified: Lyndel Safe ghimire   Orders Received/Actions taken: follow hypoglycemic protocol

## 2019-05-13 NOTE — Progress Notes (Signed)
Follow up for continued support around spiritual care consult.    Building rapport, providing emotional support at bedside.  Max Lozano is in good spirits today.  Noting feeling surreal and distant being in hospital.  Max Lozano's spouse is at home with grandson. She contracted COVID prior to [place], but was not hospitalized.  He speaks with chaplain about golf, work,  and other elements of life outside of [place].    Max Lozano, MDiv, [facility]  Lead Clinical Chaplain  [facility], Behavioral Health

## 2019-05-14 ENCOUNTER — Inpatient Hospital Stay (HOSPITAL_COMMUNITY): Payer: Private Health Insurance - Indemnity

## 2019-05-14 LAB — COMPREHENSIVE METABOLIC PANEL
ALT: 88 U/L — ABNORMAL HIGH (ref 0–44)
AST: 69 U/L — ABNORMAL HIGH (ref 15–41)
Albumin: 3.3 g/dL — ABNORMAL LOW (ref 3.5–5.0)
Alkaline Phosphatase: 188 U/L — ABNORMAL HIGH (ref 38–126)
Anion gap: 11 (ref 5–15)
BUN: 26 mg/dL — ABNORMAL HIGH (ref 8–23)
CO2: 18 mmol/L — ABNORMAL LOW (ref 22–32)
Calcium: 8.3 mg/dL — ABNORMAL LOW (ref 8.9–10.3)
Chloride: 108 mmol/L (ref 98–111)
Creatinine, Ser: 0.76 mg/dL (ref 0.61–1.24)
GFR calc Af Amer: >60 mL/min (ref >60)
GFR calc non Af Amer: >60 mL/min (ref >60)
Glucose, Bld: 51 mg/dL — ABNORMAL LOW (ref 70–99)
Potassium: 4.1 mmol/L (ref 3.5–5.1)
Sodium: 137 mmol/L (ref 135–145)
Total Bilirubin: 1.1 mg/dL (ref 0.3–1.2)
Total Protein: 6.2 g/dL — ABNORMAL LOW (ref 6.5–8.1)

## 2019-05-14 LAB — CBC WITH DIFFERENTIAL/PLATELET
Abs Immature Granulocytes: 0.37 10*3/uL — ABNORMAL HIGH (ref 0.00–0.07)
Basophils Absolute: 0.1 10*3/uL (ref 0.0–0.1)
Basophils Relative: 0 %
Eosinophils Absolute: 0.1 10*3/uL (ref 0.0–0.5)
Eosinophils Relative: 0 %
HCT: 46.1 % (ref 39.0–52.0)
Hemoglobin: 15.2 g/dL (ref 13.0–17.0)
Immature Granulocytes: 2 %
Lymphocytes Relative: 39 %
Lymphs Abs: 9.3 10*3/uL — ABNORMAL HIGH (ref 0.7–4.0)
MCH: 31 pg (ref 26.0–34.0)
MCHC: 33 g/dL (ref 30.0–36.0)
MCV: 93.9 fL (ref 80.0–100.0)
Monocytes Absolute: 0.6 10*3/uL (ref 0.1–1.0)
Monocytes Relative: 2 %
Neutro Abs: 13.6 10*3/uL — ABNORMAL HIGH (ref 1.7–7.7)
Neutrophils Relative %: 57 %
Platelets: 118 10*3/uL — ABNORMAL LOW (ref 150–400)
RBC: 4.91 MIL/uL (ref 4.22–5.81)
RDW: 13.9 % (ref 11.5–15.5)
WBC: 23.9 10*3/uL — ABNORMAL HIGH (ref 4.0–10.5)
nRBC: 0.1 % (ref 0.0–0.2)

## 2019-05-14 LAB — C-REACTIVE PROTEIN: CRP: <0.5 mg/dL (ref <1.0)

## 2019-05-14 LAB — PHOSPHORUS: Phosphorus: 4.1 mg/dL (ref 2.5–4.6)

## 2019-05-14 LAB — GLUCOSE, CAPILLARY
Glucose-Capillary: 223 mg/dL — ABNORMAL HIGH (ref 70–99)
Glucose-Capillary: 285 mg/dL — ABNORMAL HIGH (ref 70–99)
Glucose-Capillary: 261 mg/dL — ABNORMAL HIGH (ref 70–99)
Glucose-Capillary: 221 mg/dL — ABNORMAL HIGH (ref 70–99)

## 2019-05-14 LAB — MAGNESIUM: Magnesium: 2.1 mg/dL (ref 1.7–2.4)

## 2019-05-14 MED ORDER — LIP MEDEX EX OINT
TOPICAL_OINTMENT | CUTANEOUS | Status: DC | PRN
  Filled 2019-05-14 (×2): qty 7

## 2019-05-14 MED ORDER — DEXTROSE 50 % IV SOLN
INTRAVENOUS | Status: AC
  Administered 2019-05-14: 50 mL
  Filled 2019-05-14: qty 50

## 2019-05-14 MED ORDER — INSULIN ASPART 100 UNIT/ML ~~LOC~~ SOLN
0.0000 [IU] | Freq: Every day | SUBCUTANEOUS | Status: DC
  Administered 2019-05-14 – 2019-05-16 (×2): 2 [IU] via SUBCUTANEOUS

## 2019-05-14 MED ORDER — DEXTROSE 50 % IV SOLN: 50.0000 mL | INTRAVENOUS | Status: AC

## 2019-05-14 MED ORDER — FUROSEMIDE 10 MG/ML IJ SOLN
40.0000 mg | Freq: Once | INTRAMUSCULAR | Status: AC
  Administered 2019-05-14: 40 mg via INTRAVENOUS
  Filled 2019-05-14: qty 4

## 2019-05-14 MED ORDER — SALINE SPRAY 0.65 % NA SOLN
1.0000 | NASAL | Status: DC | PRN
  Filled 2019-05-14: qty 44

## 2019-05-14 MED ORDER — INSULIN ASPART 100 UNIT/ML ~~LOC~~ SOLN
1.0000 [IU] | Freq: Three times a day (TID) | SUBCUTANEOUS | Status: DC
  Administered 2019-05-14 – 2019-05-15 (×4): 1 [IU] via SUBCUTANEOUS

## 2019-05-14 NOTE — Progress Notes (Signed)
Physical Therapy Treatment Patient Details Name: Max Lozano MRN: [id number] DOB: May 04, 1951 Today's Date: 05/14/2019    History of Present Illness 69 y.o. male past medical history no nodal dysfunction chronic atrial fibrillation on Xarelto syncope and hypertension with morbid obesity comes into the hospital for 4 days of nonproductive cough and fevers tested positive for COVID-19 on April 25, 2020 2 [facility] ED was found to be satting in the 80s placed on 6 L chest x-ray showed bilateral infiltrates and SARS-CoV-2 was positive    PT Comments    Pt functionally does well but does not seem to have insight into deficits, or understand 02 needs vs activity. Therapist has attempted to educate on energy conservation but will need reinforcement of this. Pt able to get from bed to recliner with SBA/min guard assist after set up and line management assist. He was on  HHFNC (02 flow rate 50L, Fi02 100%) and NRB this am, he does desat some to high 80s with mobility but needs increased time and cues to recover, during recovery was noted to be attempting to do other things like try to urinate needing cues to stop breathe first then will continue with other tasks when ready. Completed seated therapeutic exercises in chair and pt sats remained between 95-97% with this. Will continue to work with pt educate and advance as he tolerates.    Follow Up Recommendations  Home health PT     Equipment Recommendations       Recommendations for Other Services       Precautions / Restrictions Precautions Precautions: Fall;ICD/Pacemaker Precaution Comments: desat with activity Restrictions Weight Bearing Restrictions: No    Mobility  Bed Mobility Overal bed mobility: Needs Assistance Bed Mobility: Supine to Sit;Rolling Rolling: Supervision   Supine to sit: Min guard        Transfers Overall transfer level: Needs assistance Equipment used: None Transfers: Sit to/from Stand;Stand Pivot  Transfers Sit to Stand: Min guard Stand pivot transfers: Min guard       General transfer comment: needs line management and set up assist  Ambulation/Gait             General Gait Details: did not attempt ambulation as yet sec to line length   Stairs             Wheelchair Mobility    Modified Rankin (Stroke Patients Only)       Balance Overall balance assessment: Mild deficits observed, not formally tested                                          Cognition Arousal/Alertness: Lethargic Behavior During Therapy: Impulsive(does not quite grasp his 02 needs vs activity level) Overall Cognitive Status: No family/caregiver present to determine baseline cognitive functioning                                 General Comments: seems to be close to baseline functioning, this is new experience may just need more education      Exercises General Exercises - Lower Extremity Ankle Circles/Pumps: AROM;Strengthening;10 reps Long Arc Quad: AROM;Strengthening;10 reps Heel Slides: AROM;Both;10 reps Hip ABduction/ADduction: AROM;Strengthening;10 reps Straight Leg Raises: PROM;Strengthening;10 reps    General Comments        Pertinent Vitals/Pain Pain Assessment: Faces Faces Pain Scale: Hurts little more Pain  Location: w/ increased work of breathing after desat Pain Descriptors / Indicators: Discomfort;Grimacing Pain Intervention(s): Limited activity within patient's tolerance    Home Living                      Prior Function            PT Goals (current goals can now be found in the care plan section) Acute Rehab PT Goals Patient Stated Goal: states can not catch his breath PT Goal Formulation: With patient Time For Goal Achievement: 05-23-19 Potential to Achieve Goals: Good Progress towards PT goals: Not progressing toward goals - comment(increased 02 needs limiting)    Frequency    Min 3X/week      PT Plan       Co-evaluation              AM-PAC PT "6 Clicks" Mobility   Outcome Measure  Help needed turning from your back to your side while in a flat bed without using bedrails?: None Help needed moving from lying on your back to sitting on the side of a flat bed without using bedrails?: None Help needed moving to and from a bed to a chair (including a wheelchair)?: A Little Help needed standing up from a chair using your arms (e.g., wheelchair or bedside chair)?: A Little Help needed to walk in hospital room?: A Little Help needed climbing 3-5 steps with a railing? : A Lot 6 Click Score: 19    End of Session Equipment Utilized During Treatment: Oxygen Activity Tolerance: Treatment limited secondary to medical complications (Comment);Patient limited by fatigue;Patient limited by lethargy Patient left: in chair;with call bell/phone within reach Nurse Communication: Mobility status PT Visit Diagnosis: Other abnormalities of gait and mobility (R26.89)     Time: 1914-7829 PT Time Calculation (min) (ACUTE ONLY): 26 min  Charges:  $Therapeutic Exercise: 8-22 mins $Therapeutic Activity: 8-22 mins                     Horald Chestnut, PT    Delford Field 05/14/2019, 12:37 PM

## 2019-05-14 NOTE — Progress Notes (Signed)
Assumed care of pt ~0815

## 2019-05-14 NOTE — Progress Notes (Signed)
CSW completed referral for [facility] with [facility] services (pt/ot)- CSW will continued to follow for DC planning services.   Stacy Gardner, LCSW Transitions of Care Department System Wide Float  [phone]

## 2019-05-14 NOTE — Progress Notes (Signed)
Pt found to be lethargic, hard to arouse. Confused, unable to answer any orientation questions. CBG checked, 21. D50 pushed. Pt immediately became alert and oriented x4. Vitals stable. MD notified of event.

## 2019-05-14 NOTE — Progress Notes (Signed)
PROGRESS NOTE    Max Lozano  ZOX:[id number] DOB: 07-30-1950 DOA: 05-24-2019 PCP: Malva Limes, MD    Brief Narrative:  69 year old gentleman with history of chronic atrial fibrillation on Xarelto, sick sinus syndrome status post pacemaker, history of syncope and hypertension presented to the emergency room with 4 days of nonproductive cough and fever after diagnosis of COVID-19 on April 26, 2019.  Initially saturating 80% on room air.  Initially required 6 L of nasal cannula oxygen now with increasing oxygen requirement. 05/14/2019: On 50 L high flow nasal cannula oxygen.  Chest x-ray with worsening infiltrates.   Assessment & Plan:   Principal Problem:   Pneumonia due to COVID-19 virus Active Problems:   Hyperlipidemia   HYPERTENSION, BENIGN ESSENTIAL   Permanent atrial fibrillation (HCC)   Sinoatrial node dysfunction (HCC)   Acute respiratory failure with hypoxia (HCC)  Pneumonia due to COVID-19 virus, acute hypoxemic respiratory failure due to COVID-19: Continue to monitor due to significant symptoms, still on significant oxygen requirement. chest physiotherapy, incentive spirometry, deep breathing exercises, sputum induction, mucolytic's and bronchodilators. Supplemental oxygen to keep saturations more than 89% Covid directed therapy with , steroids, dexamethasone. remdesivir, finished 5 days of therapy. actemra, received 2 subsequent doses on 1/1, 1/2. antibiotics, treated with vancomycin and cefepime. For greatly elevated WBC count and significant hypoxia. Due to severity of symptoms, patient will need daily inflammatory markers, chest x-rays, liver function test to monitor and direct COVID-19 therapies and pneumonia. Chest x-ray was ordered and reviewed today, shows worsening multifocal pneumonia. Intermittent diuresis.  New right femoral vein and right posterior tibial vein DVT on Xarelto: Found to have new DVT while therapeutic on Xarelto.  CT scan of the chest  showed no evidence of pulmonary embolism. Xarelto stopped, started on therapeutic Lovenox that we will continue until clinical improvement.  Anticipate conversion to another NOAC on discharge.  Permanent A. Fib/sinoatrial node dysfunction with pacemaker: Stable.  Rate controlled on Cardizem and metoprolol.  Hypertension: Blood pressure stable on Norvasc and diltiazem.  Uncontrolled type 2 diabetes with hypoglycemia: Newly diagnosed diabetes. Not on treatment at home.  Hemoglobin A1c 8.5. Hypoglycemic event on morning of 1/6, 1/7.  Significant hypoglycemia with altered mentation. Discontinue all long-acting insulins. Will keep on customize sliding scale only to cover for more than 200.  DVT prophylaxis: Lovenox subcu Code Status: Full code Family Communication: None, wife unable to pick up the phone.  We will try again. Disposition Plan: Remains critically sick.   Consultants:   None  Procedures:   None  Antimicrobials:  Antibiotics Given (last 72 hours)    Date/Time Action Medication Dose Rate   05/11/19 1947 New Bag/Given   ceFEPIme (MAXIPIME) 2 g in sodium chloride 0.9 % 100 mL IVPB 2 g 200 mL/hr   05/12/19 0025 New Bag/Given   vancomycin (VANCOCIN) IVPB 1000 mg/200 mL premix 1,000 mg 200 mL/hr   05/12/19 0413 New Bag/Given   ceFEPIme (MAXIPIME) 2 g in sodium chloride 0.9 % 100 mL IVPB 2 g 200 mL/hr   05/12/19 1206 New Bag/Given   ceFEPIme (MAXIPIME) 2 g in sodium chloride 0.9 % 100 mL IVPB 2 g 200 mL/hr   05/12/19 1412 New Bag/Given   vancomycin (VANCOCIN) IVPB 1000 mg/200 mL premix 1,000 mg 200 mL/hr   05/12/19 2100 New Bag/Given   ceFEPIme (MAXIPIME) 2 g in sodium chloride 0.9 % 100 mL IVPB 2 g 200 mL/hr   05/13/19 0003 New Bag/Given   vancomycin (VANCOCIN) IVPB 1000 mg/200 mL  premix 1,000 mg 200 mL/hr   05/13/19 0401 New Bag/Given   ceFEPIme (MAXIPIME) 2 g in sodium chloride 0.9 % 100 mL IVPB 2 g 200 mL/hr   05/13/19 1232 New Bag/Given   ceFEPIme (MAXIPIME) 2 g in  sodium chloride 0.9 % 100 mL IVPB 2 g 200 mL/hr   05/13/19 1311 New Bag/Given   vancomycin (VANCOCIN) IVPB 1000 mg/200 mL premix 1,000 mg 200 mL/hr   05/13/19 2021 New Bag/Given   ceFEPIme (MAXIPIME) 2 g in sodium chloride 0.9 % 100 mL IVPB 2 g 200 mL/hr   05/13/19 2355 New Bag/Given   vancomycin (VANCOCIN) IVPB 1000 mg/200 mL premix 1,000 mg 200 mL/hr   05/14/19 0422 New Bag/Given   ceFEPIme (MAXIPIME) 2 g in sodium chloride 0.9 % 100 mL IVPB 2 g 200 mL/hr   05/14/19 1325 New Bag/Given   ceFEPIme (MAXIPIME) 2 g in sodium chloride 0.9 % 100 mL IVPB 2 g 200 mL/hr         Subjective: Patient seen and examined.   Overnight developed more hypoxia, needed heated high flow and transferred to progressive.  Currently on 15 L of oxygen. Patient himself is asking when he is going home. Objective: Vitals:   05/14/19 0800 05/14/19 0830 05/14/19 0900 05/14/19 1324  BP: (!) 170/83   133/61  Pulse: 60 (!) 59 (!) 59   Resp:      Temp:   (!) 97.5 F (36.4 C)   TempSrc:   Axillary   SpO2: 97% 92% 91%   Height:        Intake/Output Summary (Last 24 hours) at 05/14/2019 1339 Last data filed at 05/14/2019 1200 Gross per 24 hour  Intake 360 ml  Output 2050 ml  Net -1690 ml   There were no vitals filed for this visit.  Examination:  General exam: Appears in moderate respiratory distress with high oxygen demand. Respiratory system: Clear to auscultation. Respiratory effort normal.  No added sounds. Cardiovascular system: S1 & S2 heard, RRR.  Pacemaker in place left precordium. Gastrointestinal system: Abdomen is nondistended, soft and nontender.  Central nervous system: Alert and oriented. No focal neurological deficits. Extremities: Symmetric 5 x 5 power. Skin: No rashes, lesions or ulcers Psychiatry: Judgement appear normal. Mood & affect appropriate.     Data Reviewed: I have personally reviewed following labs and imaging studies  CBC: Recent Labs  Lab 05/10/19 0119 05/11/19 0142  05/12/19 0130 05/13/19 0245 05/14/19 0200  WBC 17.2* 23.0* 27.2* 36.8* 23.9*  NEUTROABS 11.8* 15.5* 17.4* 19.4* 13.6*  HGB 13.9 13.9 14.2 14.9 15.2  HCT 42.6 43.1 43.5 45.4 46.1  MCV 94.5 93.3 93.8 93.2 93.9  PLT 136* 134* 119* 146* 118*   Basic Metabolic Panel: Recent Labs  Lab 05/10/19 0119 05/11/19 0142 05/12/19 0130 05/13/19 0245 05/14/19 0200  NA 140 142 139 142 137  K 4.2 4.5 4.1 4.3 4.1  CL 106 108 105 109 108  CO2 24 24 22 23  18*  GLUCOSE 278* 209* 174* 60* 51*  BUN 37* 34* 33* 30* 26*  CREATININE 0.90 0.85 0.77 0.68 0.76  CALCIUM 8.8* 8.9 8.6* 8.5* 8.3*  MG  --   --   --   --  2.1  PHOS  --   --   --   --  4.1   GFR: Estimated Creatinine Clearance: 106.9 mL/min (by C-G formula based on SCr of 0.76 mg/dL). Liver Function Tests: Recent Labs  Lab 05/10/19 0119 05/11/19 0142 05/12/19 0130 05/13/19 0245  05/14/19 0200  AST 37 40 40 67* 69*  ALT 28 31 34 57* 88*  ALKPHOS 80 110 131* 153* 188*  BILITOT 0.4 0.7 0.8 1.1 1.1  PROT 6.7 6.4* 6.3* 6.3* 6.2*  ALBUMIN 3.3* 3.3* 3.4* 3.4* 3.3*   No results for input(s): LIPASE, AMYLASE in the last 168 hours. No results for input(s): AMMONIA in the last 168 hours. Coagulation Profile: No results for input(s): INR, PROTIME in the last 168 hours. Cardiac Enzymes: No results for input(s): CKTOTAL, CKMB, CKMBINDEX, TROPONINI in the last 168 hours. BNP (last 3 results) No results for input(s): PROBNP in the last 8760 hours. HbA1C: No results for input(s): HGBA1C in the last 72 hours. CBG: Recent Labs  Lab 05/13/19 1124 05/13/19 1633 05/13/19 1957 05/14/19 0748 05/14/19 1200  GLUCAP 307* 308* 223* 285* 261*   Lipid Profile: No results for input(s): CHOL, HDL, LDLCALC, TRIG, CHOLHDL, LDLDIRECT in the last 72 hours. Thyroid Function Tests: No results for input(s): TSH, T4TOTAL, FREET4, T3FREE, THYROIDAB in the last 72 hours. Anemia Panel: No results for input(s): VITAMINB12, FOLATE, FERRITIN, TIBC, IRON,  RETICCTPCT in the last 72 hours. Sepsis Labs: Recent Labs  Lab 05/07/19 2239 05/07/19 2333 05/10/19 0935 05/11/19 0142 05/12/19 0130  PROCALCITON <0.10  --  <0.10 <0.10 <0.10  LATICACIDVEN 1.7 1.5  --   --   --     Recent Results (from the past 240 hour(s))  Blood Culture (routine x 2)     Status: None   Collection Time: 05/07/19 11:33 PM   Specimen: BLOOD  Result Value Ref Range Status   Specimen Description BLOOD RIGHT ANTECUBITAL  Final   Special Requests   Final    BOTTLES DRAWN AEROBIC AND ANAEROBIC Blood Culture adequate volume   Culture   Final    NO GROWTH 5 DAYS Performed at [facility], [address] [place], [place] [address]    Report Status 05/12/2019 FINAL  Final  Blood Culture (routine x 2)     Status: None   Collection Time: 05/07/19 11:33 PM   Specimen: BLOOD  Result Value Ref Range Status   Specimen Description BLOOD LEFT ANTECUBITAL  Final   Special Requests   Final    BOTTLES DRAWN AEROBIC AND ANAEROBIC Blood Culture adequate volume   Culture   Final    NO GROWTH 5 DAYS Performed at [facility], [address] [place], [place] [address]    Report Status 05/12/2019 FINAL  Final  MRSA PCR Screening     Status: Abnormal   Collection Time: 05/12/19  9:10 AM   Specimen: Nasopharyngeal  Result Value Ref Range Status   MRSA by PCR POSITIVE (A) NEGATIVE Final    Comment:        The GeneXpert MRSA Assay (FDA approved for NASAL specimens only), is one component of a comprehensive MRSA colonization surveillance program. It is not intended to diagnose MRSA infection nor to guide or monitor treatment for MRSA infections. RESULT CALLED TO, READ BACK BY AND VERIFIED WITH: DOBSON,RN ON 05/12/19 @ 1515 BY LE Performed at [facility], [address], [place], [place] [address]          Radiology Studies: DG CHEST PORT 1 VIEW  Result Date: 05/14/2019 CLINICAL DATA:  COVID-19 EXAM: PORTABLE CHEST 1 VIEW  COMPARISON:  05/07/2019 FINDINGS: Similar elevation of the right hemidiaphragm. Persistent bilateral opacities with relative sparing of the upper lungs. Aeration has worsened since the prior study. No significant pleural  effusion. No pneumothorax. Similar cardiomediastinal contours. Left chest wall pacemaker is again noted. IMPRESSION: Multifocal pneumonia with worsening aeration since 05/07/2019. Electronically Signed   By: Macy Mis M.D.   On: 05/14/2019 08:53        Scheduled Meds: . amLODipine  5 mg Oral Daily  . vitamin C  500 mg Oral Daily  . atorvastatin  40 mg Oral q1800  . Chlorhexidine Gluconate Cloth  6 each Topical Q0600  . dexamethasone (DECADRON) injection  6 mg Intravenous Q24H  . dextrose  50 mL Intravenous STAT  . diltiazem  240 mg Oral Daily  . enoxaparin (LOVENOX) injection  1 mg/kg Subcutaneous Q12H  . influenza vaccine adjuvanted  0.5 mL Intramuscular Tomorrow-1000  . insulin aspart  0-5 Units Subcutaneous QHS  . insulin aspart  1 Units Subcutaneous TID WC  . Ipratropium-Albuterol  2 puff Inhalation Q6H  . mupirocin ointment  1 application Nasal BID  . pneumococcal 23 valent vaccine  0.5 mL Intramuscular Tomorrow-1000  . propranolol ER  80 mg Oral Daily  . zinc sulfate  220 mg Oral Daily   Continuous Infusions: . ceFEPime (MAXIPIME) IV 2 g (05/14/19 1325)  . vancomycin Stopped (05/14/19 0800)     LOS: 6 days    Time spent: 25 minutes    Barb Merino, MD [facility] Pager [phone]

## 2019-05-14 NOTE — Progress Notes (Signed)
Patient pressed call light to alert nurse that his IV was leaking, upon entering the room patient was breathing heavily and stated that he couldn't get his breath. O2 connections were checked and pulse ox was changed. O2 sats were not increasing past 60's and patient was in distress so rapid was called. Patient was transferred to room 166 and heated high flow was applied by respiratory therapy and his sats increased to upper 80's. Patient son was called to inform him of this occurrence and patients room change. Report given to receiving nurse Kathe Mariner, R.N.

## 2019-05-14 NOTE — Progress Notes (Signed)
ANTICOAGULATION CONSULT NOTE -Follow up  Pharmacy Consult for Lovenox Indication: DVT (failed rivaroxaban)  Allergies  Allergen Reactions  . Atenolol Rash    Patient Measurements: Height: 5\' 10"  (177.8 cm) IBW/kg (Calculated) : 73  Actual weight 98.4 kg   Vital Signs: Temp: 97.5 F (36.4 C) (01/07 0900) Temp Source: Axillary (01/07 0900) BP: 170/83 (01/07 0800) Pulse Rate: 59 (01/07 0900)  Labs: Recent Labs    05/12/19 0130 05/13/19 0245 05/14/19 0200  HGB 14.2 14.9 15.2  HCT 43.5 45.4 46.1  PLT 119* 146* 118*  CREATININE 0.77 0.68 0.76    Estimated Creatinine Clearance: 106.9 mL/min (by C-G formula based on SCr of 0.76 mg/dL).   Medical History: Past Medical History:  Diagnosis Date  . Atrial fibrillation (HCC)   . Hyperlipidemia   . Hypertension   . Motion sickness    deep sea boats  . PPM-[employer] 10/11/2009   Qualifier: Diagnosis of  By: 12/11/2009, MD, Ladona Ridgel   . Sick sinus syndrome [facility])    s/p pacer  . Syncope and collapse   . Wears dentures    full upper and lower    Medications:  Rivaroxaban 20 mg daily (PTA for AF)  Assessment: 69 y/o M admitted 05-10-19 for COVID-19 PNA. Patient was on rivaroxaban PTA for a h/o AF. LE dopplers on 05/11/19 revealed DVT. Pharmacy consulted to initiate Lovenox.  H/H stable wnl, pltc 97k on admit >,improved to 134 > 146>118K today.  No bleeding noted.  SCr stable <1   Goal of Therapy:  Anti-Xa level 0.6-1 units/ml 4hrs after LMWH dose given Monitor platelets by anticoagulation protocol: Yes   Plan:  Continue Lovenox 100 mg (1 mg/kg) q 12 hours.  F/U renal funciton, CBC, s/s of bleeding  07/09/19, RPh Clinical Pharmacist 05/14/2019,11:24 AM

## 2019-05-14 NOTE — Progress Notes (Signed)
Pharmacy Antibiotic Note  Max Lozano is a 69 y.o. male admitted on 06/06/2019 with COVID-19 PNA.   Pharmacy has been consulted 1/4 for empiric vancomycin and cefepime dosing. WBC significantly elevated though patient is on steroids. PCT < 0.1 x 3. CRP 6.6 > 1.2> wnl.  Afebrile,   MRSA PCR positive,  Blood cultures negative.  SCr wnl stable  Plan: Vancomycin  1000 mg IV Q 12 hrs. Goal AUC 400-550. Expected AUC: 523 SCr used:  0.85  Cefepime 2 g IV q 8 h  F/U SCr, culture results,, duration of therapy, vancomycin levels per protocol.  Height: 5\' 10"  (177.8 cm) IBW/kg (Calculated) : 73  Temp (24hrs), Avg:97.6 F (36.4 C), Min:97 F (36.1 C), Max:98.2 F (36.8 C)  Recent Labs  Lab 05/07/19 2239 05/07/19 2333 05/10/19 0119 05/11/19 0142 05/12/19 0130 05/13/19 0245 05/14/19 0200  WBC 7.0  --  17.2* 23.0* 27.2* 36.8* 23.9*  CREATININE 1.03  --  0.90 0.85 0.77 0.68 0.76  LATICACIDVEN 1.7 1.5  --   --   --   --   --     Estimated Creatinine Clearance: 106.9 mL/min (by C-G formula based on SCr of 0.76 mg/dL).    Allergies  Allergen Reactions  . Atenolol Rash    Antimicrobials this admission: 1/4 vancomycin >>  1/4 cefepime >>    Dose adjustments this admission:   Microbiology results: 12/31 BCx: negF 1/4 MRSA PCR:  positive  Thank you for allowing pharmacy to be a part of this patient's care. 1/32, RPh Clinical Pharmacist 05/14/2019 11:50 AM

## 2019-05-15 LAB — CBC WITH DIFFERENTIAL/PLATELET
Abs Immature Granulocytes: 0.22 10*3/uL — ABNORMAL HIGH (ref 0.00–0.07)
Basophils Absolute: 0.1 10*3/uL (ref 0.0–0.1)
Basophils Relative: 0 %
Eosinophils Absolute: 0.1 10*3/uL (ref 0.0–0.5)
Eosinophils Relative: 1 %
HCT: 43.9 % (ref 39.0–52.0)
Hemoglobin: 14.7 g/dL (ref 13.0–17.0)
Immature Granulocytes: 1 %
Lymphocytes Relative: 38 %
Lymphs Abs: 7.1 10*3/uL — ABNORMAL HIGH (ref 0.7–4.0)
MCH: 31.1 pg (ref 26.0–34.0)
MCHC: 33.5 g/dL (ref 30.0–36.0)
MCV: 92.8 fL (ref 80.0–100.0)
Monocytes Absolute: 0.5 10*3/uL (ref 0.1–1.0)
Monocytes Relative: 3 %
Neutro Abs: 10.9 10*3/uL — ABNORMAL HIGH (ref 1.7–7.7)
Neutrophils Relative %: 57 %
Platelets: 94 10*3/uL — ABNORMAL LOW (ref 150–400)
RBC: 4.73 MIL/uL (ref 4.22–5.81)
RDW: 14.3 % (ref 11.5–15.5)
WBC: 18.9 10*3/uL — ABNORMAL HIGH (ref 4.0–10.5)
nRBC: 0 % (ref 0.0–0.2)

## 2019-05-15 LAB — COMPREHENSIVE METABOLIC PANEL
ALT: 105 U/L — ABNORMAL HIGH (ref 0–44)
AST: 69 U/L — ABNORMAL HIGH (ref 15–41)
Albumin: 3.3 g/dL — ABNORMAL LOW (ref 3.5–5.0)
Alkaline Phosphatase: 195 U/L — ABNORMAL HIGH (ref 38–126)
Anion gap: 11 (ref 5–15)
BUN: 29 mg/dL — ABNORMAL HIGH (ref 8–23)
CO2: 22 mmol/L (ref 22–32)
Calcium: 8.3 mg/dL — ABNORMAL LOW (ref 8.9–10.3)
Chloride: 103 mmol/L (ref 98–111)
Creatinine, Ser: 0.87 mg/dL (ref 0.61–1.24)
GFR calc Af Amer: >60 mL/min (ref >60)
GFR calc non Af Amer: >60 mL/min (ref >60)
Glucose, Bld: 190 mg/dL — ABNORMAL HIGH (ref 70–99)
Potassium: 4.3 mmol/L (ref 3.5–5.1)
Sodium: 136 mmol/L (ref 135–145)
Total Bilirubin: 1.4 mg/dL — ABNORMAL HIGH (ref 0.3–1.2)
Total Protein: 6.1 g/dL — ABNORMAL LOW (ref 6.5–8.1)

## 2019-05-15 LAB — GLUCOSE, CAPILLARY
Glucose-Capillary: 249 mg/dL — ABNORMAL HIGH (ref 70–99)
Glucose-Capillary: 183 mg/dL — ABNORMAL HIGH (ref 70–99)
Glucose-Capillary: 227 mg/dL — ABNORMAL HIGH (ref 70–99)
Glucose-Capillary: 209 mg/dL — ABNORMAL HIGH (ref 70–99)

## 2019-05-15 MED ORDER — LORAZEPAM 2 MG/ML IJ SOLN
1.0000 mg | Freq: Once | INTRAMUSCULAR | Status: AC
  Administered 2019-05-15: 1 mg via INTRAVENOUS

## 2019-05-15 MED ORDER — LORAZEPAM 2 MG/ML IJ SOLN
INTRAMUSCULAR | Status: AC
  Filled 2019-05-15: qty 1

## 2019-05-15 MED ORDER — INSULIN ASPART 100 UNIT/ML ~~LOC~~ SOLN
0.0000 [IU] | Freq: Three times a day (TID) | SUBCUTANEOUS | Status: DC
  Administered 2019-05-15: 3 [IU] via SUBCUTANEOUS
  Administered 2019-05-16: 5 [IU] via SUBCUTANEOUS

## 2019-05-15 NOTE — Progress Notes (Signed)
Pt had a coughing episode and 02 sats dropped to 60% on 100% HFNC 55 L02. No reserve, Rapid response called, MD notified, and pt tx to ICU at 2300. Bedside report given to Jerene Bears, RN.

## 2019-05-15 NOTE — Progress Notes (Signed)
PROGRESS NOTE    Max Lozano  NIO:[id number] DOB: 1950/10/03 DOA: 05-16-2019 PCP: Malva Limes, MD    Brief Narrative:  69 year old gentleman with history of chronic atrial fibrillation on Xarelto, sick sinus syndrome status post pacemaker, history of syncope and hypertension presented to the emergency room with 4 days of nonproductive cough and fever after diagnosis of COVID-19 on April 26, 2019.  Initially saturating 80% on room air.  Initially required 6 L of nasal cannula oxygen now with increasing oxygen requirement. 05/14/2019: On 50 L high flow nasal cannula oxygen.  Chest x-ray with worsening infiltrates.   Assessment & Plan:   Principal Problem:   Pneumonia due to COVID-19 virus Active Problems:   Hyperlipidemia   HYPERTENSION, BENIGN ESSENTIAL   Permanent atrial fibrillation (HCC)   Sinoatrial node dysfunction (HCC)   Acute respiratory failure with hypoxia (HCC)  Pneumonia due to COVID-19 virus, acute hypoxemic respiratory failure due to COVID-19: Continue to monitor due to significant symptoms, still on significant oxygen requirement. chest physiotherapy, incentive spirometry, deep breathing exercises, sputum induction, mucolytic's and bronchodilators. Supplemental oxygen to keep saturations more than 85 % Covid directed therapy with , steroids, dexamethasone. remdesivir, finished 5 days of therapy. actemra, received 2 subsequent doses on 1/1, 1/2. antibiotics, treated with vancomycin and cefepime. For greatly elevated WBC count and significant hypoxia. Negative cultures.  Will finish 5 days of therapy. Due to severity of symptoms, patient will need daily inflammatory markers, chest x-rays, liver function test to monitor and direct COVID-19 therapies and pneumonia. Intermittent diuresis.  Additional dose today.  New right femoral vein and right posterior tibial vein DVT on Xarelto: Found to have new DVT while therapeutic on Xarelto.  CT scan of the chest showed no  evidence of pulmonary embolism. Xarelto stopped, started on therapeutic Lovenox that we will continue until clinical improvement.  Anticipate conversion to another NOAC on discharge.  Permanent A. Fib/sinoatrial node dysfunction with pacemaker: Stable.  Rate controlled on Cardizem and metoprolol.  Hypertension: Blood pressure stable on Norvasc and diltiazem.  Uncontrolled type 2 diabetes with hypoglycemia: Newly diagnosed diabetes. Not on treatment at home.  Hemoglobin A1c 8.5. Hypoglycemic event on morning of 1/6, 1/7.  Significant hypoglycemia with altered mentation. Discontinued all long-acting insulins. Tends to drop blood sugars, will allow higher blood sugars. Will keep on customize sliding scale only to cover for more than 200.  DVT prophylaxis: Lovenox subcu Code Status: Full code Family Communication: Patient's wife. Disposition Plan: Remains critically sick.  Still on significant oxygen.  Anticipate home when stable.   Consultants:   None  Procedures:   None  Antimicrobials:  Antibiotics Given (last 72 hours)    Date/Time Action Medication Dose Rate   05/12/19 1206 New Bag/Given   ceFEPIme (MAXIPIME) 2 g in sodium chloride 0.9 % 100 mL IVPB 2 g 200 mL/hr   05/12/19 1412 New Bag/Given   vancomycin (VANCOCIN) IVPB 1000 mg/200 mL premix 1,000 mg 200 mL/hr   05/12/19 2100 New Bag/Given   ceFEPIme (MAXIPIME) 2 g in sodium chloride 0.9 % 100 mL IVPB 2 g 200 mL/hr   05/13/19 0003 New Bag/Given   vancomycin (VANCOCIN) IVPB 1000 mg/200 mL premix 1,000 mg 200 mL/hr   05/13/19 0401 New Bag/Given   ceFEPIme (MAXIPIME) 2 g in sodium chloride 0.9 % 100 mL IVPB 2 g 200 mL/hr   05/13/19 1232 New Bag/Given   ceFEPIme (MAXIPIME) 2 g in sodium chloride 0.9 % 100 mL IVPB 2 g 200 mL/hr  05/13/19 1311 New Bag/Given   vancomycin (VANCOCIN) IVPB 1000 mg/200 mL premix 1,000 mg 200 mL/hr   05/13/19 2021 New Bag/Given   ceFEPIme (MAXIPIME) 2 g in sodium chloride 0.9 % 100 mL IVPB 2 g 200  mL/hr   05/13/19 2355 New Bag/Given   vancomycin (VANCOCIN) IVPB 1000 mg/200 mL premix 1,000 mg 200 mL/hr   05/14/19 0422 New Bag/Given   ceFEPIme (MAXIPIME) 2 g in sodium chloride 0.9 % 100 mL IVPB 2 g 200 mL/hr   05/14/19 1325 New Bag/Given   ceFEPIme (MAXIPIME) 2 g in sodium chloride 0.9 % 100 mL IVPB 2 g 200 mL/hr   05/14/19 1446 New Bag/Given   vancomycin (VANCOCIN) IVPB 1000 mg/200 mL premix 1,000 mg 200 mL/hr   05/14/19 2020 New Bag/Given   ceFEPIme (MAXIPIME) 2 g in sodium chloride 0.9 % 100 mL IVPB 2 g 200 mL/hr   05/15/19 0019 New Bag/Given   vancomycin (VANCOCIN) IVPB 1000 mg/200 mL premix 1,000 mg 200 mL/hr   05/15/19 0405 New Bag/Given   ceFEPIme (MAXIPIME) 2 g in sodium chloride 0.9 % 100 mL IVPB 2 g 200 mL/hr         Subjective: Seen and examined.  No overnight events.  Today he is more awake.  Was able to stay on 35-40 L of oxygen.  Afebrile. Objective: Vitals:   05/14/19 2130 05/15/19 0000 05/15/19 0400 05/15/19 0803  BP:  (!) 148/71 138/72 (!) 151/68  Pulse: 68 (!) 59 (!) 59 63  Resp: (!) 22 (!) 29 (!) 24 (!) 23  Temp:  98 F (36.7 C) 98.3 F (36.8 C) 99.1 F (37.3 C)  TempSrc:  Oral Oral Axillary  SpO2:  90% 93% 95%  Height:        Intake/Output Summary (Last 24 hours) at 05/15/2019 1150 Last data filed at 05/15/2019 1000 Gross per 24 hour  Intake 2044.18 ml  Output 2150 ml  Net -105.82 ml   There were no vitals filed for this visit.  Examination:  General exam: Appears comfortable with mild respiratory distress on high flow oxygen. Respiratory system: Clear to auscultation. Respiratory effort normal.  No added sounds. Cardiovascular system: S1 & S2 heard, RRR.  Pacemaker in place left precordium. Gastrointestinal system: Abdomen is nondistended, soft and nontender.  Central nervous system: Alert and oriented. No focal neurological deficits. Extremities: Symmetric 5 x 5 power. Skin: No rashes, lesions or ulcers Psychiatry: Judgement appear normal.  Mood & affect appropriate.     Data Reviewed: I have personally reviewed following labs and imaging studies  CBC: Recent Labs  Lab 05/11/19 0142 05/12/19 0130 05/13/19 0245 05/14/19 0200 05/15/19 0105  WBC 23.0* 27.2* 36.8* 23.9* 18.9*  NEUTROABS 15.5* 17.4* 19.4* 13.6* 10.9*  HGB 13.9 14.2 14.9 15.2 14.7  HCT 43.1 43.5 45.4 46.1 43.9  MCV 93.3 93.8 93.2 93.9 92.8  PLT 134* 119* 146* 118* 94*   Basic Metabolic Panel: Recent Labs  Lab 05/11/19 0142 05/12/19 0130 05/13/19 0245 05/14/19 0200 05/15/19 0105  NA 142 139 142 137 136  K 4.5 4.1 4.3 4.1 4.3  CL 108 105 109 108 103  CO2 24 22 23  18* 22  GLUCOSE 209* 174* 60* 51* 190*  BUN 34* 33* 30* 26* 29*  CREATININE 0.85 0.77 0.68 0.76 0.87  CALCIUM 8.9 8.6* 8.5* 8.3* 8.3*  MG  --   --   --  2.1  --   PHOS  --   --   --  4.1  --  GFR: Estimated Creatinine Clearance: 98.3 mL/min (by C-G formula based on SCr of 0.87 mg/dL). Liver Function Tests: Recent Labs  Lab 05/11/19 0142 05/12/19 0130 05/13/19 0245 05/14/19 0200 05/15/19 0105  AST 40 40 67* 69* 69*  ALT 31 34 57* 88* 105*  ALKPHOS 110 131* 153* 188* 195*  BILITOT 0.7 0.8 1.1 1.1 1.4*  PROT 6.4* 6.3* 6.3* 6.2* 6.1*  ALBUMIN 3.3* 3.4* 3.4* 3.3* 3.3*   No results for input(s): LIPASE, AMYLASE in the last 168 hours. No results for input(s): AMMONIA in the last 168 hours. Coagulation Profile: No results for input(s): INR, PROTIME in the last 168 hours. Cardiac Enzymes: No results for input(s): CKTOTAL, CKMB, CKMBINDEX, TROPONINI in the last 168 hours. BNP (last 3 results) No results for input(s): PROBNP in the last 8760 hours. HbA1C: No results for input(s): HGBA1C in the last 72 hours. CBG: Recent Labs  Lab 05/14/19 0748 05/14/19 1200 05/14/19 1644 05/14/19 2127 05/15/19 0754  GLUCAP 285* 261* 249* 221* 183*   Lipid Profile: No results for input(s): CHOL, HDL, LDLCALC, TRIG, CHOLHDL, LDLDIRECT in the last 72 hours. Thyroid Function Tests: No  results for input(s): TSH, T4TOTAL, FREET4, T3FREE, THYROIDAB in the last 72 hours. Anemia Panel: No results for input(s): VITAMINB12, FOLATE, FERRITIN, TIBC, IRON, RETICCTPCT in the last 72 hours. Sepsis Labs: Recent Labs  Lab 05/10/19 0935 05/11/19 0142 05/12/19 0130  PROCALCITON <0.10 <0.10 <0.10    Recent Results (from the past 240 hour(s))  Blood Culture (routine x 2)     Status: None   Collection Time: 05/07/19 11:33 PM   Specimen: BLOOD  Result Value Ref Range Status   Specimen Description BLOOD RIGHT ANTECUBITAL  Final   Special Requests   Final    BOTTLES DRAWN AEROBIC AND ANAEROBIC Blood Culture adequate volume   Culture   Final    NO GROWTH 5 DAYS Performed at [facility], [address] [place], [place] [address]    Report Status 05/12/2019 FINAL  Final  Blood Culture (routine x 2)     Status: None   Collection Time: 05/07/19 11:33 PM   Specimen: BLOOD  Result Value Ref Range Status   Specimen Description BLOOD LEFT ANTECUBITAL  Final   Special Requests   Final    BOTTLES DRAWN AEROBIC AND ANAEROBIC Blood Culture adequate volume   Culture   Final    NO GROWTH 5 DAYS Performed at [facility], [address] [place], [place] [address]    Report Status 05/12/2019 FINAL  Final  MRSA PCR Screening     Status: Abnormal   Collection Time: 05/12/19  9:10 AM   Specimen: Nasopharyngeal  Result Value Ref Range Status   MRSA by PCR POSITIVE (A) NEGATIVE Final    Comment:        The GeneXpert MRSA Assay (FDA approved for NASAL specimens only), is one component of a comprehensive MRSA colonization surveillance program. It is not intended to diagnose MRSA infection nor to guide or monitor treatment for MRSA infections. RESULT CALLED TO, READ BACK BY AND VERIFIED WITH: DOBSON,RN ON 05/12/19 @ 1515 BY LE Performed at [facility], [address] [place], [place] [address]          Radiology Studies: DG CHEST PORT 1  VIEW  Result Date: 05/14/2019 CLINICAL DATA:  COVID-19 EXAM: PORTABLE CHEST 1 VIEW COMPARISON:  05/07/2019 FINDINGS: Similar elevation of the right hemidiaphragm. Persistent bilateral opacities with relative sparing of the upper lungs. Aeration  has worsened since the prior study. No significant pleural effusion. No pneumothorax. Similar cardiomediastinal contours. Left chest wall pacemaker is again noted. IMPRESSION: Multifocal pneumonia with worsening aeration since 05/07/2019. Electronically Signed   By: Macy Mis M.D.   On: 05/14/2019 08:53        Scheduled Meds: . amLODipine  5 mg Oral Daily  . vitamin C  500 mg Oral Daily  . atorvastatin  40 mg Oral q1800  . Chlorhexidine Gluconate Cloth  6 each Topical Q0600  . dexamethasone (DECADRON) injection  6 mg Intravenous Q24H  . diltiazem  240 mg Oral Daily  . enoxaparin (LOVENOX) injection  1 mg/kg Subcutaneous Q12H  . influenza vaccine adjuvanted  0.5 mL Intramuscular Tomorrow-1000  . insulin aspart  0-5 Units Subcutaneous QHS  . insulin aspart  1 Units Subcutaneous TID WC  . Ipratropium-Albuterol  2 puff Inhalation Q6H  . mupirocin ointment  1 application Nasal BID  . pneumococcal 23 valent vaccine  0.5 mL Intramuscular Tomorrow-1000  . propranolol ER  80 mg Oral Daily  . zinc sulfate  220 mg Oral Daily   Continuous Infusions: . ceFEPime (MAXIPIME) IV 2 g (05/15/19 0405)  . vancomycin 1,000 mg (05/15/19 0019)     LOS: 7 days    Time spent: 25 minutes    Barb Merino, MD [facility] Pager [phone]

## 2019-05-16 ENCOUNTER — Encounter (HOSPITAL_COMMUNITY): Payer: Self-pay | Admitting: Certified Registered"

## 2019-05-16 ENCOUNTER — Inpatient Hospital Stay (HOSPITAL_COMMUNITY): Payer: Private Health Insurance - Indemnity

## 2019-05-16 ENCOUNTER — Inpatient Hospital Stay (HOSPITAL_COMMUNITY): Payer: Private Health Insurance - Indemnity | Admitting: Certified Registered"

## 2019-05-16 LAB — COMPREHENSIVE METABOLIC PANEL
ALT: 79 U/L — ABNORMAL HIGH (ref 0–44)
AST: 52 U/L — ABNORMAL HIGH (ref 15–41)
Albumin: 3.1 g/dL — ABNORMAL LOW (ref 3.5–5.0)
Alkaline Phosphatase: 218 U/L — ABNORMAL HIGH (ref 38–126)
Anion gap: 9 (ref 5–15)
BUN: 42 mg/dL — ABNORMAL HIGH (ref 8–23)
CO2: 22 mmol/L (ref 22–32)
Calcium: 8 mg/dL — ABNORMAL LOW (ref 8.9–10.3)
Chloride: 105 mmol/L (ref 98–111)
Creatinine, Ser: 0.94 mg/dL (ref 0.61–1.24)
GFR calc Af Amer: >60 mL/min (ref >60)
GFR calc non Af Amer: >60 mL/min (ref >60)
Glucose, Bld: 270 mg/dL — ABNORMAL HIGH (ref 70–99)
Potassium: 5.2 mmol/L — ABNORMAL HIGH (ref 3.5–5.1)
Sodium: 136 mmol/L (ref 135–145)
Total Bilirubin: 1.3 mg/dL — ABNORMAL HIGH (ref 0.3–1.2)
Total Protein: 5.5 g/dL — ABNORMAL LOW (ref 6.5–8.1)

## 2019-05-16 LAB — CBC WITH DIFFERENTIAL/PLATELET
Abs Immature Granulocytes: 0.13 10*3/uL — ABNORMAL HIGH (ref 0.00–0.07)
Basophils Absolute: 0.1 10*3/uL (ref 0.0–0.1)
Basophils Relative: 0 %
Eosinophils Absolute: 0.1 10*3/uL (ref 0.0–0.5)
Eosinophils Relative: 0 %
HCT: 44.9 % (ref 39.0–52.0)
Hemoglobin: 14.8 g/dL (ref 13.0–17.0)
Immature Granulocytes: 1 %
Lymphocytes Relative: 31 %
Lymphs Abs: 5.5 10*3/uL — ABNORMAL HIGH (ref 0.7–4.0)
MCH: 31.2 pg (ref 26.0–34.0)
MCHC: 33 g/dL (ref 30.0–36.0)
MCV: 94.5 fL (ref 80.0–100.0)
Monocytes Absolute: 0.5 10*3/uL (ref 0.1–1.0)
Monocytes Relative: 3 %
Neutro Abs: 11.5 10*3/uL — ABNORMAL HIGH (ref 1.7–7.7)
Neutrophils Relative %: 65 %
Platelets: 95 10*3/uL — ABNORMAL LOW (ref 150–400)
RBC: 4.75 MIL/uL (ref 4.22–5.81)
RDW: 14.7 % (ref 11.5–15.5)
WBC: 17.7 10*3/uL — ABNORMAL HIGH (ref 4.0–10.5)
nRBC: 0 % (ref 0.0–0.2)

## 2019-05-16 LAB — POCT I-STAT 7, (LYTES, BLD GAS, ICA,H+H)
Acid-base deficit: 3 mmol/L — ABNORMAL HIGH (ref 0.0–2.0)
Acid-base deficit: 4 mmol/L — ABNORMAL HIGH (ref 0.0–2.0)
Acid-base deficit: 5 mmol/L — ABNORMAL HIGH (ref 0.0–2.0)
Bicarbonate: 21.9 mmol/L (ref 20.0–28.0)
Bicarbonate: 23.6 mmol/L (ref 20.0–28.0)
Bicarbonate: 22.1 mmol/L (ref 20.0–28.0)
Calcium, Ion: 1.22 mmol/L (ref 1.15–1.40)
Calcium, Ion: 1.19 mmol/L (ref 1.15–1.40)
Calcium, Ion: 1.16 mmol/L (ref 1.15–1.40)
HCT: 43 % (ref 39.0–52.0)
HCT: 43 % (ref 39.0–52.0)
HCT: 45 % (ref 39.0–52.0)
Hemoglobin: 14.6 g/dL (ref 13.0–17.0)
Hemoglobin: 14.6 g/dL (ref 13.0–17.0)
Hemoglobin: 15.3 g/dL (ref 13.0–17.0)
O2 Saturation: 95 %
O2 Saturation: 89 %
O2 Saturation: 87 %
Patient temperature: 98.6
Patient temperature: 98.8
Potassium: 4.8 mmol/L (ref 3.5–5.1)
Potassium: 5.7 mmol/L — ABNORMAL HIGH (ref 3.5–5.1)
Potassium: 5.5 mmol/L — ABNORMAL HIGH (ref 3.5–5.1)
Sodium: 137 mmol/L (ref 135–145)
Sodium: 138 mmol/L (ref 135–145)
Sodium: 139 mmol/L (ref 135–145)
TCO2: 23 mmol/L (ref 22–32)
TCO2: 25 mmol/L (ref 22–32)
TCO2: 23 mmol/L (ref 22–32)
pCO2 arterial: 38.5 mmHg (ref 32.0–48.0)
pCO2 arterial: 53.7 mmHg — ABNORMAL HIGH (ref 32.0–48.0)
pCO2 arterial: 46.9 mmHg (ref 32.0–48.0)
pH, Arterial: 7.363 (ref 7.350–7.450)
pH, Arterial: 7.25 — ABNORMAL LOW (ref 7.350–7.450)
pH, Arterial: 7.282 — ABNORMAL LOW (ref 7.350–7.450)
pO2, Arterial: 80 mmHg — ABNORMAL LOW (ref 83.0–108.0)
pO2, Arterial: 68 mmHg — ABNORMAL LOW (ref 83.0–108.0)
pO2, Arterial: 60 mmHg — ABNORMAL LOW (ref 83.0–108.0)

## 2019-05-16 LAB — PHOSPHORUS: Phosphorus: 7.9 mg/dL — ABNORMAL HIGH (ref 2.5–4.6)

## 2019-05-16 LAB — MAGNESIUM: Magnesium: 2.7 mg/dL — ABNORMAL HIGH (ref 1.7–2.4)

## 2019-05-16 LAB — TRIGLYCERIDES: Triglycerides: 149 mg/dL (ref <150)

## 2019-05-16 LAB — GLUCOSE, CAPILLARY
Glucose-Capillary: 150 mg/dL — ABNORMAL HIGH (ref 70–99)
Glucose-Capillary: 217 mg/dL — ABNORMAL HIGH (ref 70–99)
Glucose-Capillary: 255 mg/dL — ABNORMAL HIGH (ref 70–99)
Glucose-Capillary: 269 mg/dL — ABNORMAL HIGH (ref 70–99)

## 2019-05-16 MED ORDER — VITAL HIGH PROTEIN PO LIQD
1000.0000 mL | ORAL | Status: AC
  Administered 2019-05-16 – 2019-05-18 (×3): 1000 mL

## 2019-05-16 MED ORDER — MIDAZOLAM HCL 2 MG/2ML IJ SOLN
4.0000 mg | Freq: Once | INTRAMUSCULAR | Status: AC
  Administered 2019-05-16: 4 mg via INTRAVENOUS

## 2019-05-16 MED ORDER — PROPOFOL 1000 MG/100ML IV EMUL
5.0000 ug/kg/min | INTRAVENOUS | Status: DC
  Administered 2019-05-16: 30 ug/kg/min via INTRAVENOUS
  Administered 2019-05-16: 15 ug/kg/min via INTRAVENOUS
  Filled 2019-05-16 (×3): qty 100

## 2019-05-16 MED ORDER — PROPOFOL 10 MG/ML IV BOLUS
INTRAVENOUS | Status: AC
  Filled 2019-05-16: qty 20

## 2019-05-16 MED ORDER — STERILE WATER FOR INJECTION IJ SOLN
INTRAMUSCULAR | Status: AC
  Filled 2019-05-16: qty 10

## 2019-05-16 MED ORDER — ETOMIDATE 2 MG/ML IV SOLN: INTRAVENOUS | Status: DC | PRN

## 2019-05-16 MED ORDER — CHLORHEXIDINE GLUCONATE CLOTH 2 % EX PADS
6.0000 | MEDICATED_PAD | Freq: Every day | CUTANEOUS | Status: DC
  Administered 2019-05-16 – 2019-05-21 (×6): 6 via TOPICAL

## 2019-05-16 MED ORDER — ATORVASTATIN CALCIUM 40 MG PO TABS
40.0000 mg | ORAL_TABLET | Freq: Every day | ORAL | Status: DC
  Administered 2019-05-16 – 2019-05-21 (×6): 40 mg
  Filled 2019-05-16 (×6): qty 1

## 2019-05-16 MED ORDER — LACTATED RINGERS IV BOLUS
500.0000 mL | Freq: Once | INTRAVENOUS | Status: AC
  Administered 2019-05-16: 500 mL via INTRAVENOUS

## 2019-05-16 MED ORDER — FAMOTIDINE IN NACL 20-0.9 MG/50ML-% IV SOLN
20.0000 mg | Freq: Two times a day (BID) | INTRAVENOUS | Status: DC
  Filled 2019-05-16: qty 50

## 2019-05-16 MED ORDER — VECURONIUM BROMIDE 10 MG IV SOLR
INTRAVENOUS | Status: AC
  Filled 2019-05-16: qty 10

## 2019-05-16 MED ORDER — ASCORBIC ACID 500 MG PO TABS
500.0000 mg | ORAL_TABLET | Freq: Every day | ORAL | Status: DC
  Administered 2019-05-17 – 2019-05-22 (×6): 500 mg
  Filled 2019-05-16 (×6): qty 1

## 2019-05-16 MED ORDER — MIDAZOLAM HCL 2 MG/2ML IJ SOLN
INTRAMUSCULAR | Status: AC
  Filled 2019-05-16: qty 4

## 2019-05-16 MED ORDER — MIDAZOLAM HCL 2 MG/2ML IJ SOLN
2.0000 mg | INTRAMUSCULAR | Status: AC | PRN
  Administered 2019-05-16 (×3): 2 mg via INTRAVENOUS
  Filled 2019-05-16 (×2): qty 2

## 2019-05-16 MED ORDER — FAMOTIDINE 40 MG/5ML PO SUSR
20.0000 mg | Freq: Two times a day (BID) | ORAL | Status: DC
  Administered 2019-05-16 – 2019-05-19 (×6): 20 mg
  Filled 2019-05-16 (×6): qty 2.5

## 2019-05-16 MED ORDER — FENTANYL CITRATE (PF) 100 MCG/2ML IJ SOLN
INTRAMUSCULAR | Status: AC
  Administered 2019-05-16: 100 ug
  Filled 2019-05-16: qty 2

## 2019-05-16 MED ORDER — MIDAZOLAM HCL 2 MG/2ML IJ SOLN
INTRAMUSCULAR | Status: DC | PRN
  Administered 2019-05-16: 2 mg via INTRAVENOUS

## 2019-05-16 MED ORDER — FENTANYL 2500MCG IN NS 250ML (10MCG/ML) PREMIX INFUSION
0.0000 ug/h | INTRAVENOUS | Status: DC
  Administered 2019-05-16: 100 ug/h via INTRAVENOUS
  Administered 2019-05-16: 150 ug/h via INTRAVENOUS
  Administered 2019-05-17: 300 ug/h via INTRAVENOUS
  Administered 2019-05-17: 225 ug/h via INTRAVENOUS
  Administered 2019-05-17: 300 ug/h via INTRAVENOUS
  Administered 2019-05-18 (×3): 350 ug/h via INTRAVENOUS
  Administered 2019-05-18 – 2019-05-21 (×8): 400 ug/h via INTRAVENOUS
  Administered 2019-05-21: 05:00:00 40 ug/h via INTRAVENOUS
  Filled 2019-05-16 (×19): qty 250

## 2019-05-16 MED ORDER — FENTANYL CITRATE (PF) 100 MCG/2ML IJ SOLN
100.0000 ug | Freq: Once | INTRAMUSCULAR | Status: AC
  Administered 2019-05-16: 100 ug via INTRAVENOUS

## 2019-05-16 MED ORDER — SUCCINYLCHOLINE CHLORIDE 20 MG/ML IJ SOLN
INTRAMUSCULAR | Status: DC | PRN
  Administered 2019-05-16: 140 mg via INTRAVENOUS

## 2019-05-16 MED ORDER — PRO-STAT SUGAR FREE PO LIQD
30.0000 mL | Freq: Two times a day (BID) | ORAL | Status: DC
  Administered 2019-05-16 – 2019-05-18 (×5): 30 mL
  Filled 2019-05-16 (×5): qty 30

## 2019-05-16 MED ORDER — ROCURONIUM BROMIDE 10 MG/ML (PF) SYRINGE
PREFILLED_SYRINGE | INTRAVENOUS | Status: AC
  Filled 2019-05-16: qty 10

## 2019-05-16 MED ORDER — FENTANYL CITRATE (PF) 100 MCG/2ML IJ SOLN
INTRAMUSCULAR | Status: AC
  Filled 2019-05-16: qty 2

## 2019-05-16 MED ORDER — INSULIN ASPART 100 UNIT/ML ~~LOC~~ SOLN
0.0000 [IU] | SUBCUTANEOUS | Status: DC
  Administered 2019-05-16: 3 [IU] via SUBCUTANEOUS
  Administered 2019-05-16: 2 [IU] via SUBCUTANEOUS
  Administered 2019-05-16: 5 [IU] via SUBCUTANEOUS
  Administered 2019-05-17 (×3): 3 [IU] via SUBCUTANEOUS
  Administered 2019-05-17: 8 [IU] via SUBCUTANEOUS
  Administered 2019-05-17 – 2019-05-18 (×3): 3 [IU] via SUBCUTANEOUS
  Administered 2019-05-18: 8 [IU] via SUBCUTANEOUS
  Administered 2019-05-18: 2 [IU] via SUBCUTANEOUS
  Administered 2019-05-18 (×2): 3 [IU] via SUBCUTANEOUS
  Administered 2019-05-18: 8 [IU] via SUBCUTANEOUS
  Administered 2019-05-19: 3 [IU] via SUBCUTANEOUS
  Administered 2019-05-19: 5 [IU] via SUBCUTANEOUS
  Administered 2019-05-19: 8 [IU] via SUBCUTANEOUS
  Administered 2019-05-19: 3 [IU] via SUBCUTANEOUS
  Administered 2019-05-19: 8 [IU] via SUBCUTANEOUS
  Administered 2019-05-19: 5 [IU] via SUBCUTANEOUS
  Administered 2019-05-20 (×2): 3 [IU] via SUBCUTANEOUS
  Administered 2019-05-20: 5 [IU] via SUBCUTANEOUS
  Administered 2019-05-20: 3 [IU] via SUBCUTANEOUS
  Administered 2019-05-20: 11 [IU] via SUBCUTANEOUS
  Administered 2019-05-20: 3 [IU] via SUBCUTANEOUS
  Administered 2019-05-21 (×3): 11 [IU] via SUBCUTANEOUS
  Administered 2019-05-21: 15 [IU] via SUBCUTANEOUS

## 2019-05-16 MED ORDER — ETOMIDATE 2 MG/ML IV SOLN
INTRAVENOUS | Status: DC | PRN
  Administered 2019-05-16 (×2): 10 mg via INTRAVENOUS

## 2019-05-16 MED ORDER — IPRATROPIUM-ALBUTEROL 0.5-2.5 (3) MG/3ML IN SOLN
3.0000 mL | Freq: Four times a day (QID) | RESPIRATORY_TRACT | Status: DC
  Administered 2019-05-16 – 2019-05-18 (×8): 3 mL via RESPIRATORY_TRACT
  Filled 2019-05-16 (×8): qty 3

## 2019-05-16 MED ORDER — ETOMIDATE 2 MG/ML IV SOLN
INTRAVENOUS | Status: AC
  Administered 2019-05-16: 20 mg
  Filled 2019-05-16: qty 20

## 2019-05-16 MED ORDER — INSULIN DETEMIR 100 UNIT/ML ~~LOC~~ SOLN
8.0000 [IU] | Freq: Two times a day (BID) | SUBCUTANEOUS | Status: DC
  Administered 2019-05-16 – 2019-05-18 (×5): 8 [IU] via SUBCUTANEOUS
  Filled 2019-05-16 (×6): qty 0.08

## 2019-05-16 MED ORDER — SUCCINYLCHOLINE CHLORIDE 200 MG/10ML IV SOSY
PREFILLED_SYRINGE | INTRAVENOUS | Status: AC
  Administered 2019-05-16: 140 mg
  Filled 2019-05-16: qty 10

## 2019-05-16 MED ORDER — FENTANYL CITRATE (PF) 100 MCG/2ML IJ SOLN
INTRAMUSCULAR | Status: DC | PRN
  Administered 2019-05-16: 100 ug via INTRAVENOUS

## 2019-05-16 MED ORDER — MIDAZOLAM HCL 2 MG/2ML IJ SOLN
2.0000 mg | INTRAMUSCULAR | Status: DC | PRN
  Administered 2019-05-17 – 2019-05-18 (×6): 2 mg via INTRAVENOUS
  Filled 2019-05-16 (×7): qty 2

## 2019-05-16 MED ORDER — LACTATED RINGERS IV SOLN: INTRAVENOUS | Status: DC

## 2019-05-16 NOTE — Progress Notes (Signed)
eLink Physician-Brief Progress Note Patient Name: JABRON WEESE DOB: Sep 17, 1950 MRN: [id number]   Date of Service  05/16/2019  HPI/Events of Note  Agitation, ventilator asynchrony and hypotension of higher doses of Propofol IV infusion.   eICU Interventions  Will order: 1. Fentanyl IV infusion. Titrate to RASS = 0 to -1.  2. Wean Propofol IV infusion off as tolerated.      Intervention Category Major Interventions: Delirium, psychosis, severe agitation - evaluation and management;Hypotension - evaluation and management  Deliyah Muckle Dennard Nip 05/16/2019, 5:10 AM

## 2019-05-16 NOTE — Anesthesia Procedure Notes (Signed)
Performed by: Willliam Pettet A, CRNA       

## 2019-05-16 NOTE — Anesthesia Preprocedure Evaluation (Signed)
Anesthesia Evaluation  Patient identified by MRN, date of birth, ID band Patient awake    Reviewed: Allergy & Precautions, NPO status , Patient's Chart, lab work & pertinent test results  Airway Mallampati: I  TM Distance: >3 FB Neck ROM: Full    Dental  (+) Lower Dentures, Upper Dentures   Pulmonary sleep apnea , pneumonia, unresolved,  COVID penumonia, transferred to ICU overnight after rapid response for desaturation to 60s with coughing spell. saturating in the 80s on maximum noninvasive support when called for intubation, inc work of breathing.    + decreased breath sounds      Cardiovascular hypertension, Pt. on medications Normal cardiovascular exam+ dysrhythmias Atrial Fibrillation + pacemaker  Rhythm:Irregular Rate:Normal  Sick sinus syndrome- PPM 2009  PAF- on dilt and propanolol for rate control, xarelto A/C  HLD   Neuro/Psych negative neurological ROS  negative psych ROS   GI/Hepatic negative GI ROS, Neg liver ROS,   Endo/Other  Obesity BMI 31  Renal/GU negative Renal ROS  negative genitourinary   Musculoskeletal negative musculoskeletal ROS (+)   Abdominal (+) + obese,   Peds negative pediatric ROS (+)  Hematology negative hematology ROS (+)   Anesthesia Other Findings   Reproductive/Obstetrics negative OB ROS                             Anesthesia Physical  Anesthesia Plan  ASA: IV and emergent  Anesthesia Plan: General   Post-op Pain Management:    Induction: Intravenous and Rapid sequence  PONV Risk Score and Plan:   Airway Management Planned: Oral ETT  Additional Equipment:   Intra-op Plan:   Post-operative Plan:   Informed Consent:     Only emergency history available  Plan Discussed with: CRNA  Anesthesia Plan Comments: (Emergent intubation for acute hypoxic respiratory failure 2/2 COVID pneumonia)        Anesthesia Quick Evaluation

## 2019-05-16 NOTE — Progress Notes (Signed)
eLink Physician-Brief Progress Note Patient Name: Max Lozano DOB: 1950-05-21 MRN: [id number]   Date of Service  05/16/2019  HPI/Events of Note  RN enquiring if OK to give Lovenox given platelets of 95. Patient is on therapeutic Lovenox for new DVT.  On review of labs I also note that he was increasingly hypercarbic and acidemic on his last ABG at 3PM today. Does not appear that any vent changes were made between now and time of that ABG.   eICU Interventions  OK to give Lovenox. Hold if plts < 50.  Repeat ABG now.     Intervention Category Major Interventions: Acid-Base disturbance - evaluation and management  [name] 05/16/2019, 10:10 PM

## 2019-05-16 NOTE — Progress Notes (Addendum)
Rapid Response Event Note  Overview: Called by PCU Charge RN about Rapid Response needed on Pt. O2 sats in the 60's and pt seems to be in distress.      Initial Focused Assessment: Pt very anxious and on HHFNC 55L/100% & 15L NRB. Sitting up in bed and tachypnic. O2 sats fluctuating between mid 70's-mid 80s   Interventions: MD ordered 1mg  Ativan. Ativan given and pt transferred to ICU  Plan of Care (if not transferred):Tx to ICU for closer monitoring and possible intubation         

## 2019-05-16 NOTE — Progress Notes (Signed)
Pt's head turned and arms rotated with no complications.  

## 2019-05-16 NOTE — Anesthesia Procedure Notes (Signed)
Procedure Name: Intubation Date/Time: 05/16/2019 1:59 AM Performed by: Adair Laundry, CRNA Pre-anesthesia Checklist: Patient identified, Emergency Drugs available, Suction available and Patient being monitored Patient Re-evaluated:Patient Re-evaluated prior to induction Oxygen Delivery Method: Ambu bag Preoxygenation: Pre-oxygenation with 100% oxygen Induction Type: IV induction, Rapid sequence and Cricoid Pressure applied Ventilation: Mask ventilation with difficulty Laryngoscope Size: Glidescope Grade View: Grade I Tube size: 8.0 mm Number of attempts: 1 Airway Equipment and Method: Video-laryngoscopy and Stylet Secured at: 24 cm Tube secured with: Tape Dental Injury: Teeth and Oropharynx as per pre-operative assessment

## 2019-05-16 NOTE — Plan of Care (Signed)

## 2019-05-16 NOTE — Progress Notes (Addendum)
Max Lozano was transferred to ICU at 2300 for increasing hypoxia and increased work of breathing. Upon admission patient was sating 85% on 50L/100%FiO2 HHF and 15L NRB. Patient was placed in the prone position which did not significantly help his sats. Patient remained 87-91%. At 0130 [facility] MD Dr. Arsenio Loader was called and notified of severe AMS, low sats in mid 80s. The decision was made to intubate the patient. The patients wife was asked and notified, and agreed to the procedure. Anesthesia, CRNA, Dr. Dwana Curd, bedside and charge RN were present during intubation. Patient had to be placed on 100%/16 PEEP/RR 24/TV 580. Patient was initially placed solely on propofol, which did not adequately sedate pt without compromising blood pressure, so fentanyl was added. Pt is now much more synchronous with the vent and is normotensive. An OG was placed, condom cath was applied. Patient remains sedated and on ventilator. His wife was called and updated, all questions were answered.

## 2019-05-16 NOTE — Progress Notes (Signed)
eLink Physician-Brief Progress Note Patient Name: Max Lozano DOB: 1950/06/25 MRN: [id number]   Date of Service  05/16/2019  HPI/Events of Note  VDRF - Patient intubated and ventilated. Request for ventilator and sedation  orders.   eICU Interventions  Will order: 1. Ventilator settings: 100%/PRVC 24/TV 580/P 16. 2. ABG at 3:15 AM. 3. Propofol IV infusion. Titrate to RASS = 0 to -1.      Intervention Category Major Interventions: Respiratory failure - evaluation and management  Quida Glasser [place] 05/16/2019, 2:18 AM

## 2019-05-16 NOTE — Consult Note (Signed)
NAME:  Max Lozano, MRN:  [id number], DOB:  22-Nov-1950, LOS: 8 ADMISSION DATE:  05-26-2019, CONSULTATION DATE:  05/16/2019 REFERRING MD:  Lyndel Safe, CHIEF COMPLAINT:  Hypoxia/respiratory failure   Brief History   69 year old gentleman with history of chronic atrial fibrillation on Xarelto, sick sinus syndrome status post pacemaker, history of syncope and hypertension presented to the emergency room with 4 days of nonproductive cough and fever on 12/31.  His wife was symptomatic, tested and was Covid positive on December 20, but he tested negative at that time.   Initially saturating 80% on room air.  Initially required 6 L of nasal cannula oxygen now with increasing oxygen requirement.  The last 48 hours his hypoxia is worsened and was transitioned to 50 L high flow nasal cannula oxygen on 1/7.  Chest x-ray with worsening infiltrates at that time.  Further declined in the early hours of 05/16/2019 and the decision was made to intubate him and transfer him to ICU care.  History of present illness   69 year old gentleman with history of chronic atrial fibrillation on Xarelto, sick sinus syndrome status post pacemaker, history of syncope and hypertension presented to the emergency room with 4 days of nonproductive cough and fever on 12/31.  His wife was symptomatic, tested and was Covid positive on December 20, but he tested negative at that time.   Initially saturating 80% on room air.  Initially required 6 L of nasal cannula oxygen now with increasing oxygen requirement.  The last 48 hours his hypoxia is worsened and was transitioned to 50 L high flow nasal cannula oxygen on 1/7.  Chest x-ray with worsening infiltrates at that time.  Further declined in the early hours of 05/16/2019 and the decision was made to intubate him and transfer him to ICU care.  Past Medical History  HTN AFib Sick sinus sydrome (pacemaker placed)  Significant Hospital Events   1/9 intubation  Consults:  PCCM  Procedures:  ETT  1/9  Significant Diagnostic Tests:  COVID + 12/31 CXR with diffuse bilateral opacities consistent with dx   Micro Data:  COVID pos HIV neg  Antimicrobials:  Cefepime 2g q8 1/4-->1/8  Interim history/subjective:  Worsening hypoxia, intubated at ~2a.m., sedated for vent synchrony. BP trending down but afebrile. Expected response to slight decrease in sedation and fluid challenge  Objective   Blood pressure (!) 84/60, pulse (!) 58, temperature 98.2 F (36.8 C), temperature source Axillary, resp. rate 20, height 5\' 10"  (1.778 m), weight 98 kg, SpO2 (!) 89 %.    Vent Mode: PRVC FiO2 (%):  [100 %] 100 % Set Rate:  [24 bmp] 24 bmp Vt Set:  [580 mL] 580 mL PEEP:  [16 cmH20] 16 cmH20 Plateau Pressure:  [18 cmH20-32 cmH20] 32 cmH20   Intake/Output Summary (Last 24 hours) at 05/16/2019 1157 Last data filed at 05/16/2019 07/14/2019 Gross per 24 hour  Intake 1173.48 ml  Output 900 ml  Net 273.48 ml   Filed Weights   05/15/19 1959  Weight: 98 kg    Examination: General: NAD, ill-appearing HENT: AT/[facility]; ETT in place Lungs: diffuse crackles and expiratory wheeze bilaterally Cardiovascular: RRR, no r/m/g Abdomen: SND Extremities: no c/c/e. PIV in each hand/forearm without erythema Neuro: sedated, PERRL, eyes midline and w/o deviation. corneal reflex intact GU: condom catheter  Resolved Hospital Problem list     Assessment & Plan:   Hypoxic respiratory failure secondary to COVID: PLAN:  ARDS protocol Continue dexamethasone per protocol; remdesivir  CV: -telemetry, no afib currently  but will treat as necessary - continue cardizem -lovenox  F/E/N: -trend labs and replace electrolytes as needed -strict I/Os -nutrition consult for tube feeding  ID: No cultures pending Follow fever curve   Best practice:  Diet: NPO; nutrition consult for TF Pain/Anxiety/Delirium protocol (if indicated): in place VAP protocol (if indicated): in place DVT prophylaxis: lovenox GI  prophylaxis: Pepcid/PPI, HOB >30 Glucose control: SSI as needed Mobility: bed Code Status: full Family Communication: will update family Disposition: ICU  Labs   CBC: Recent Labs  Lab 05/12/19 0130 05/13/19 0245 05/14/19 0200 05/15/19 0105 05/16/19 0301 05/16/19 0648  WBC 27.2* 36.8* 23.9* 18.9*  --  17.7*  NEUTROABS 17.4* 19.4* 13.6* 10.9*  --  11.5*  HGB 14.2 14.9 15.2 14.7 14.6 14.8  HCT 43.5 45.4 46.1 43.9 43.0 44.9  MCV 93.8 93.2 93.9 92.8  --  94.5  PLT 119* 146* 118* 94*  --  95*    Basic Metabolic Panel: Recent Labs  Lab 05/12/19 0130 05/13/19 0245 05/14/19 0200 05/15/19 0105 05/16/19 0301 05/16/19 0648  NA 139 142 137 136 137 136  K 4.1 4.3 4.1 4.3 4.8 5.2*  CL 105 109 108 103  --  105  CO2 22 23 18* 22  --  22  GLUCOSE 174* 60* 51* 190*  --  270*  BUN 33* 30* 26* 29*  --  42*  CREATININE 0.77 0.68 0.76 0.87  --  0.94  CALCIUM 8.6* 8.5* 8.3* 8.3*  --  8.0*  MG  --   --  2.1  --   --   --   PHOS  --   --  4.1  --   --   --    GFR: Estimated Creatinine Clearance: 90.8 mL/min (by C-G formula based on SCr of 0.94 mg/dL). Recent Labs  Lab 05/10/19 0935 05/11/19 0142 05/12/19 0130 05/13/19 0245 05/14/19 0200 05/15/19 0105 05/16/19 0648  PROCALCITON <0.10 <0.10 <0.10  --   --   --   --   WBC  --  23.0* 27.2* 36.8* 23.9* 18.9* 17.7*    Liver Function Tests: Recent Labs  Lab 05/12/19 0130 05/13/19 0245 05/14/19 0200 05/15/19 0105 05/16/19 0648  AST 40 67* 69* 69* 52*  ALT 34 57* 88* 105* 79*  ALKPHOS 131* 153* 188* 195* 218*  BILITOT 0.8 1.1 1.1 1.4* 1.3*  PROT 6.3* 6.3* 6.2* 6.1* 5.5*  ALBUMIN 3.4* 3.4* 3.3* 3.3* 3.1*   No results for input(s): LIPASE, AMYLASE in the last 168 hours. No results for input(s): AMMONIA in the last 168 hours.  ABG    Component Value Date/Time   PHART 7.363 05/16/2019 0301   PCO2ART 38.5 05/16/2019 0301   PO2ART 80.0 (L) 05/16/2019 0301   HCO3 21.9 05/16/2019 0301   TCO2 23 05/16/2019 0301   ACIDBASEDEF  3.0 (H) 05/16/2019 0301   O2SAT 95.0 05/16/2019 0301     Coagulation Profile: No results for input(s): INR, PROTIME in the last 168 hours.  Cardiac Enzymes: No results for input(s): CKTOTAL, CKMB, CKMBINDEX, TROPONINI in the last 168 hours.  HbA1C: Hgb A1c MFr Bld  Date/Time Value Ref Range Status  05/09/2019 02:17 AM 8.5 (H) 4.8 - 5.6 % Final    Comment:    (NOTE) Pre diabetes:          5.7%-6.4% Diabetes:              >6.4% Glycemic control for   <7.0% adults with diabetes   10/06/2015 03:47 PM 6.5 (  H) 4.8 - 5.6 % Final    Comment:             Pre-diabetes: 5.7 - 6.4          Diabetes: >6.4          Glycemic control for adults with diabetes: <7.0     CBG: Recent Labs  Lab 05/15/19 1154 05/15/19 1657 05/16/19 0027 05/16/19 0732 05/16/19 1115  GLUCAP 227* 209* 255* 269* 217*    Review of Systems:   Unable to obtain secondary to intubation/sedation  Past Medical History  He,  has a past medical history of Atrial fibrillation ([place]), Hyperlipidemia, Hypertension, Motion sickness, PPM-Medtronic (10/11/2009), Sick sinus syndrome ([place]), Syncope and collapse, and Wears dentures.   Surgical History    Past Surgical History:  Procedure Laterality Date  . COLONOSCOPY WITH PROPOFOL N/A 03/25/2015   Procedure: COLONOSCOPY WITH PROPOFOL;  Surgeon: Lucilla Lame, MD;  Location: [facility];  Service: Endoscopy;  Laterality: N/A;  ULCER ASCENDING COLON BX.   Randolm Idol / REPLACE / REMOVE PACEMAKER  09/04/07   Medtronic     Social History   reports that he has never smoked. He has never used smokeless tobacco. He reports that he does not drink alcohol or use drugs.   Family History   His family history includes Diabetes in his mother; Heart attack in his father.   Allergies Allergies  Allergen Reactions  . Atenolol Rash     Home Medications  Prior to Admission medications   Medication Sig Start Date End Date Taking? Authorizing Provider  atorvastatin (LIPITOR)  10 MG tablet TAKE 1 TABLET(10 MG) BY MOUTH DAILY 05/08/18  Yes Gollan, Kathlene November, MD  diltiazem (CARDIZEM CD) 240 MG 24 hr capsule Take 1 capsule (240 mg total) by mouth daily. 01/01/19  Yes Gollan, Kathlene November, MD  fish oil-omega-3 fatty acids 1000 MG capsule Take 1 g by mouth 2 (two) times daily.     Yes [provider]  propranolol ER (INDERAL LA) 80 MG 24 hr capsule Take 1 capsule (80 mg total) by mouth daily. 01/01/19  Yes Gollan, Kathlene November, MD  XARELTO 20 MG TABS tablet TAKE 1 TABLET BY MOUTH ONCE DAILY WITH SUPPER 12/23/18  Yes Gollan, Kathlene November, MD  lisinopril (ZESTRIL) 20 MG tablet Take 1 tablet (20 mg total) by mouth daily. 12/22/18 03/22/19  Minna Merritts, MD     Critical care time: I have independently seen and examined the patient, reviewed data, and developed an assessment and plan. A total of 44  minutes were spent in critical care assessment and medical decision making. This critical care time does not reflect procedure time, or teaching time or supervisory time of PA/NP/Med student/Med Resident, etc but could involve care discussion time.   Bonna Gains, MD PhD 05/16/19 1:07 PM

## 2019-05-16 NOTE — Progress Notes (Signed)
Brief Nutrition Note  Consult received for enteral/tube feeding initiation and management.  Enteral access: OG tube with tip in proximal stomach per abdominal x-ray reading  Adult Enteral Nutrition Protocol initiated. Full assessment to follow.  Admitting Dx: COVID-19 [U07.1] Acute respiratory failure with hypoxia (HCC) [J96.01]  Body mass index is 31 kg/m. Pt meets criteria for obesity class I based on current BMI.  Labs:  Recent Labs  Lab 05/14/19 0200 05/15/19 0105 05/16/19 0301 05/16/19 0648  NA 137 136 137 136  K 4.1 4.3 4.8 5.2*  CL 108 103  --  105  CO2 18* 22  --  22  BUN 26* 29*  --  42*  CREATININE 0.76 0.87  --  0.94  CALCIUM 8.3* 8.3*  --  8.0*  MG 2.1  --   --   --   PHOS 4.1  --   --   --   GLUCOSE 51* 190*  --  270*    Earma Reading, MS, RD, LDN Inpatient Clinical Dietitian Pager: [phone] Weekend/After Hours: [phone]

## 2019-05-16 NOTE — Anesthesia Postprocedure Evaluation (Signed)
Anesthesia Post Note  Patient: Max Lozano  Procedure(s) Performed: AN AD HOC COVID-19     Patient location during evaluation: ICU Anesthesia Type: General Level of consciousness: patient remains intubated per anesthesia plan Vital Signs Assessment: post-procedure vital signs reviewed and stable Respiratory status: patient on ventilator - see flowsheet for VS Cardiovascular status: stable Anesthetic complications: no    Last Vitals:  Vitals:   05/16/19 0100 05/16/19 0207  BP: (!) 133/91   Pulse: 65   Resp: (!) 24   Temp:    SpO2: (!) 88% (!) 82%    Last Pain:  Vitals:   05/16/19 0000  TempSrc:   PainSc: 0-No pain                 Lannie Fields

## 2019-05-16 NOTE — Progress Notes (Signed)
Pt now requiring intubation. MD Arsenio Loader gave order. CRNA and anesthesiologist called.   MD Dwana Curd, CRNA, Anesthesiologist, Respiratory X2, Bedside RN, and Charge RN @ bedside  Intubation occurred at 0158  Meds given:  140mg  of Sucs 20mg  Etomidate   4mg  Versed-Post intubation 0203 100 fentanyl-Post intubation 0203

## 2019-05-17 ENCOUNTER — Inpatient Hospital Stay (HOSPITAL_COMMUNITY): Payer: Private Health Insurance - Indemnity

## 2019-05-17 LAB — CBC WITH DIFFERENTIAL/PLATELET
Abs Immature Granulocytes: 0.18 10*3/uL — ABNORMAL HIGH (ref 0.00–0.07)
Basophils Absolute: 0.1 10*3/uL (ref 0.0–0.1)
Basophils Relative: 0 %
Eosinophils Absolute: 0 10*3/uL (ref 0.0–0.5)
Eosinophils Relative: 0 %
HCT: 46.1 % (ref 39.0–52.0)
Hemoglobin: 14.5 g/dL (ref 13.0–17.0)
Immature Granulocytes: 1 %
Lymphocytes Relative: 37 %
Lymphs Abs: 9.2 10*3/uL — ABNORMAL HIGH (ref 0.7–4.0)
MCH: 30.7 pg (ref 26.0–34.0)
MCHC: 31.5 g/dL (ref 30.0–36.0)
MCV: 97.7 fL (ref 80.0–100.0)
Monocytes Absolute: 0.4 10*3/uL (ref 0.1–1.0)
Monocytes Relative: 1 %
Neutro Abs: 15.1 10*3/uL — ABNORMAL HIGH (ref 1.7–7.7)
Neutrophils Relative %: 61 %
Platelets: 96 10*3/uL — ABNORMAL LOW (ref 150–400)
RBC: 4.72 MIL/uL (ref 4.22–5.81)
RDW: 15.1 % (ref 11.5–15.5)
WBC: 25 10*3/uL — ABNORMAL HIGH (ref 4.0–10.5)
nRBC: 0.1 % (ref 0.0–0.2)

## 2019-05-17 LAB — COMPREHENSIVE METABOLIC PANEL
ALT: 62 U/L — ABNORMAL HIGH (ref 0–44)
AST: 35 U/L (ref 15–41)
Albumin: 3.4 g/dL — ABNORMAL LOW (ref 3.5–5.0)
Alkaline Phosphatase: 200 U/L — ABNORMAL HIGH (ref 38–126)
Anion gap: 12 (ref 5–15)
BUN: 83 mg/dL — ABNORMAL HIGH (ref 8–23)
CO2: 21 mmol/L — ABNORMAL LOW (ref 22–32)
Calcium: 7.7 mg/dL — ABNORMAL LOW (ref 8.9–10.3)
Chloride: 104 mmol/L (ref 98–111)
Creatinine, Ser: 1.96 mg/dL — ABNORMAL HIGH (ref 0.61–1.24)
GFR calc Af Amer: 40 mL/min — ABNORMAL LOW (ref >60)
GFR calc non Af Amer: 35 mL/min — ABNORMAL LOW (ref >60)
Glucose, Bld: 189 mg/dL — ABNORMAL HIGH (ref 70–99)
Potassium: 5.3 mmol/L — ABNORMAL HIGH (ref 3.5–5.1)
Sodium: 137 mmol/L (ref 135–145)
Total Bilirubin: 1.2 mg/dL (ref 0.3–1.2)
Total Protein: 6 g/dL — ABNORMAL LOW (ref 6.5–8.1)

## 2019-05-17 LAB — FERRITIN: Ferritin: 774 ng/mL — ABNORMAL HIGH (ref 24–336)

## 2019-05-17 LAB — POCT I-STAT 7, (LYTES, BLD GAS, ICA,H+H)
Acid-base deficit: 6 mmol/L — ABNORMAL HIGH (ref 0.0–2.0)
Bicarbonate: 21.7 mmol/L (ref 20.0–28.0)
Calcium, Ion: 1.14 mmol/L — ABNORMAL LOW (ref 1.15–1.40)
HCT: 44 % (ref 39.0–52.0)
Hemoglobin: 15 g/dL (ref 13.0–17.0)
O2 Saturation: 92 %
Potassium: 5.3 mmol/L — ABNORMAL HIGH (ref 3.5–5.1)
Sodium: 139 mmol/L (ref 135–145)
TCO2: 23 mmol/L (ref 22–32)
pCO2 arterial: 48.4 mmHg — ABNORMAL HIGH (ref 32.0–48.0)
pH, Arterial: 7.26 — ABNORMAL LOW (ref 7.350–7.450)
pO2, Arterial: 74 mmHg — ABNORMAL LOW (ref 83.0–108.0)

## 2019-05-17 LAB — GLUCOSE, CAPILLARY
Glucose-Capillary: 206 mg/dL — ABNORMAL HIGH (ref 70–99)
Glucose-Capillary: 169 mg/dL — ABNORMAL HIGH (ref 70–99)
Glucose-Capillary: 191 mg/dL — ABNORMAL HIGH (ref 70–99)
Glucose-Capillary: 157 mg/dL — ABNORMAL HIGH (ref 70–99)
Glucose-Capillary: 193 mg/dL — ABNORMAL HIGH (ref 70–99)

## 2019-05-17 LAB — PHOSPHORUS
Phosphorus: 7.5 mg/dL — ABNORMAL HIGH (ref 2.5–4.6)
Phosphorus: 6.9 mg/dL — ABNORMAL HIGH (ref 2.5–4.6)

## 2019-05-17 LAB — NA AND K (SODIUM & POTASSIUM), RAND UR
Potassium Urine: 70 mmol/L
Sodium, Ur: <10 mmol/L

## 2019-05-17 LAB — MAGNESIUM
Magnesium: 2.7 mg/dL — ABNORMAL HIGH (ref 1.7–2.4)
Magnesium: 2.7 mg/dL — ABNORMAL HIGH (ref 1.7–2.4)

## 2019-05-17 LAB — HEPARIN ANTI-XA: Heparin LMW: >2 IU/mL

## 2019-05-17 MED ORDER — DEXTROSE 5 % IV BOLUS
250.0000 mL | Freq: Once | INTRAVENOUS | Status: AC
  Administered 2019-05-17: 250 mL via INTRAVENOUS

## 2019-05-17 MED ORDER — VANCOMYCIN HCL 750 MG/150ML IV SOLN: 750.0000 mg | Freq: Two times a day (BID) | INTRAVENOUS | Status: DC

## 2019-05-17 MED ORDER — SODIUM ZIRCONIUM CYCLOSILICATE 10 G PO PACK
10.0000 g | PACK | Freq: Every day | ORAL | Status: AC
  Administered 2019-05-17: 10 g via ORAL
  Filled 2019-05-17: qty 1

## 2019-05-17 MED ORDER — VANCOMYCIN HCL 1750 MG/350ML IV SOLN: 1750.0000 mg | INTRAVENOUS | Status: DC

## 2019-05-17 MED ORDER — VANCOMYCIN HCL 2000 MG/400ML IV SOLN
2000.0000 mg | Freq: Once | INTRAVENOUS | Status: AC
  Administered 2019-05-17: 2000 mg via INTRAVENOUS
  Filled 2019-05-17: qty 400

## 2019-05-17 MED ORDER — SODIUM CHLORIDE 0.9 % IV SOLN
2.0000 g | Freq: Two times a day (BID) | INTRAVENOUS | Status: DC
  Administered 2019-05-17 – 2019-05-21 (×8): 2 g via INTRAVENOUS
  Filled 2019-05-17 (×8): qty 2

## 2019-05-17 MED ORDER — NOREPINEPHRINE 4 MG/250ML-% IV SOLN
0.0000 ug/min | INTRAVENOUS | Status: DC
  Administered 2019-05-18: 2 ug/min via INTRAVENOUS
  Administered 2019-05-19: 3 ug/min via INTRAVENOUS
  Filled 2019-05-17 (×3): qty 250

## 2019-05-17 MED ORDER — LACTATED RINGERS IV BOLUS
500.0000 mL | Freq: Once | INTRAVENOUS | Status: AC
  Administered 2019-05-17: 500 mL via INTRAVENOUS

## 2019-05-17 MED ORDER — DEXMEDETOMIDINE HCL IN NACL 400 MCG/100ML IV SOLN
0.4000 ug/kg/h | INTRAVENOUS | Status: AC
  Administered 2019-05-17: 0.6 ug/kg/h via INTRAVENOUS
  Administered 2019-05-17 (×2): 0.4 ug/kg/h via INTRAVENOUS
  Administered 2019-05-18 (×2): 0.9 ug/kg/h via INTRAVENOUS
  Administered 2019-05-18: 0.8 ug/kg/h via INTRAVENOUS
  Filled 2019-05-17 (×7): qty 100

## 2019-05-17 NOTE — Progress Notes (Signed)
Assisted tele visit to patient with wife and daughter.  Sarya Linenberger Ann, RN  

## 2019-05-17 NOTE — Progress Notes (Signed)
Pharmacy Antibiotic Note  Max Lozano is a 69 y.o. male admitted on 05-13-19 with sepsis.  Pharmacy has been consulted for Cefepime and Vancomycin dosing. WBC continues to trend up today and worsened chest x-ray per MD. Of note, patient's SCr has trended up to 1.96.   Plan: -Cefepime 2 gm IV Q 12 hours -Vancomycin 2 gm IV once, then start Vancomycin 1750 mg IV Q 48 hrs. Goal AUC 400-550. Expected AUC: 457 SCr used: 1.9 -Reculture today  -Monitor CBC, renal fx, cultures and clinical progress   Height: 5\' 10"  (177.8 cm) Weight: (pt proned unable to obtain pt weight on bed scale) IBW/kg (Calculated) : 73  Temp (24hrs), Avg:97.6 F (36.4 C), Min:96.5 F (35.8 C), Max:98.3 F (36.8 C)  Recent Labs  Lab 05/13/19 0245 05/14/19 0200 05/15/19 0105 05/16/19 0648 05/17/19 0600  WBC 36.8* 23.9* 18.9* 17.7* 25.0*  CREATININE 0.68 0.76 0.87 0.94 1.96*    Estimated Creatinine Clearance: 43.5 mL/min (A) (by C-G formula based on SCr of 1.96 mg/dL (H)).    Allergies  Allergen Reactions  . Atenolol Rash    12/31: AZT/CTX x1 at [facility] 1/1 Actemra x1 1/2 Actemra x1 1/1 Remdesivir >> 1/4  1/4 Cefepime >> 1/8; 1/10>> 1/4 Vanco >> 1/8; 1/10>>   12/31 BCx: negf 1/4 MRSA PCR: pos  Thank you for allowing pharmacy to be a part of this patient's care.  1/32, PharmD., BCPS Clinical Pharmacist Clinical phone for 05/17/19 until 5pm: [phone]

## 2019-05-17 NOTE — Plan of Care (Signed)
Pt sedated and intubated, unable to update on POC 

## 2019-05-17 NOTE — Progress Notes (Signed)
Patient's head turned and arms rotated.   

## 2019-05-17 NOTE — Progress Notes (Addendum)
ANTICOAGULATION CONSULT NOTE -Follow up  Pharmacy Consult for Lovenox>heparin Indication: DVT (failed rivaroxaban)  Allergies  Allergen Reactions  . Atenolol Rash    Patient Measurements: Height: 5\' 10"  (177.8 cm) Weight: (pt proned unable to obtain pt weight on bed scale) IBW/kg (Calculated) : 73  Actual weight 98.4 kg   Vital Signs: Temp: 96.4 F (35.8 C) (01/10 1600) Temp Source: Axillary (01/10 1600) BP: 103/66 (01/10 1700) Pulse Rate: 58 (01/10 1700)  Labs: Recent Labs    05/15/19 0105 05/16/19 0648 05/16/19 2248 05/17/19 0600 05/17/19 1207  HGB 14.7 14.8 15.3 14.5 15.0  HCT 43.9 44.9 45.0 46.1 44.0  PLT 94* 95*  --  96*  --   CREATININE 0.87 0.94  --  1.96*  --     Estimated Creatinine Clearance: 43.5 mL/min (A) (by C-G formula based on SCr of 1.96 mg/dL (H)).   Medical History: Past Medical History:  Diagnosis Date  . Atrial fibrillation (HCC)   . Hyperlipidemia   . Hypertension   . Motion sickness    deep sea boats  . PPM-Medtronic 10/11/2009   Qualifier: Diagnosis of  By: 12/11/2009, MD, Ladona Ridgel   . Sick sinus syndrome [facility])    s/p pacer  . Syncope and collapse   . Wears dentures    full upper and lower    Medications:  Rivaroxaban 20 mg daily (PTA for AF)  Assessment: 69 y/o M admitted 06/02/2019 for COVID-19 PNA. Patient was on rivaroxaban PTA for a h/o AF. LE dopplers on 05/11/19 revealed DVT. Pharmacy consulted to initiate Lovenox.   He has been on Lovenox here for a new DVT after failing Xarelto. His renal function is getting worse so we are going to use heparin instead. He got his last dose of lovenox at 0900 this AM. We are going to check an Anti-Xa level now and see where we at before starting heparin.   Addendum:  His anti-Xa level is >2. We will wait for it to trend down before starting IV heparin.  Goal of Therapy:  Heparin level 0.3-0.7  Monitor platelets by anticoagulation protocol: Yes   Plan:   Dc lovenox Heparin  level in AM F/u start IV heparin  07/09/19, PharmD, BCIDP, AAHIVP, CPP Infectious Disease Pharmacist 05/17/2019 5:42 PM

## 2019-05-17 NOTE — Progress Notes (Signed)
Pt's head turned and arm rotated with no complications. ETT secured.

## 2019-05-17 NOTE — Progress Notes (Signed)
Pt supinated without complications.  ETT is secure.

## 2019-05-17 NOTE — Procedures (Signed)
Central Line Procedure Note Max Lozano [id number] August 05, 1950  Procedure: Right IJ triple lumen catheter placement Indications: limited vascular access, need for multiple infusions  Procedure Details Consent: Risks of procedure as well as the alternatives and risks of each were explained to the (patient/caregiver).  Consent for procedure obtained. Time Out: Verified patient identification, verified procedure, site/side was marked, verified correct patient position, special equipment/implants available, medications/allergies/relevent history reviewed, required imaging and test results available.  Performed  Drugs:  100 mcg Fentanyl, Procedure: Central venous catheter placement Indications: limited venous access, need for multiple infusions  HD CatheterProcedure Note Max Lozano [id number] 09/22/1954  Procedure:HD Catheter insertion-right IJ Indications:acute renal failure  Procedure Details Consent:Risks of procedure as well as the alternatives and risks of each were explained to the (patient/caregiver). Consent for procedure obtained. Time WOE:[id number] patient identification, verified procedure, site/side was marked, verified correct patient position, special equipment/implants available, medications/allergies/relevent history reviewed, required imaging and test results available. Performed  Drugs:141mcg Fentanyl, 4mg  Versed,  DESCRIPTION OF PROCEDURE: After timeout, pt positioned in Trendelenberg, mild sedation administered by nurse under my direction. Wide R neck/clavicle prep with Chloraprep. Sterile gown, gloves, COVID barrier precautions, large sterile drape applied.  Ultrasound visualization of RIJ and cath with 20g needle. Venous return through needle and wire threaded easily, no ectopy or cardiac instability. Dilated and passed 20 cm triple lume catheter using Seldinger technique. Sutured to skin, Biopatch placed and sterile dressing applied.   EBL:  ~5cc  Evaluation Hemodynamic Status:BP stable throughout; O2 sats:stable throughout Patient's Current Condition:stable Complications:No apparent complications Patientdidtolerate procedure well. Chest X-ray ordered to verify placement. CXR: pending.  MD, PhD 05/17/19 6:25 PM

## 2019-05-17 NOTE — Progress Notes (Signed)
Called Mrs. Haye and updated her about her husband's condition.  Specifically discussed that while his blood gas/oxygenation parameters were about the same to slightly improved, his chest x-ray looked worse.  In conjunction with an increased white count, I was concerned for him developing infection and we were going to culture and start some broad-spectrum antibiotics. Also told her that his urine output, renal function labs were worsening. Told her we were treating this to the best of our ability as well.  Questions answered, and tried to give reassurance while being factual and realistic and that things are likely to get worse before they get better, and getting better may take a while.   Gwynne Edinger MD PhD 2:04 PM

## 2019-05-17 NOTE — Progress Notes (Signed)
MD Ollen Bowl stated okay to use CVC.

## 2019-05-17 NOTE — Progress Notes (Signed)
Pt's spouse called and updated on pt's status.  Pt proned at this time, tolerating vent with sedation via central line.

## 2019-05-17 NOTE — Progress Notes (Signed)
NAME:  Max Lozano, MRN:  [id number], DOB:  1950/10/26, LOS: 9 ADMISSION DATE:  05/16/2019, CONSULTATION DATE:  05/16/2019 REFERRING MD:  Lyndel Safe, CHIEF COMPLAINT:  Hypoxia/respiratory failure   Brief History   69 year old gentleman with history of chronic atrial fibrillation on Xarelto, sick sinus syndrome status post pacemaker, history of syncope and hypertension presented to the emergency room with 4 days of nonproductive cough and fever on 12/31.  His wife was symptomatic, tested and was Covid positive on December 20, but he tested negative at that time.   Initially saturating 80% on room air.  Initially required 6 L of nasal cannula oxygen now with increasing oxygen requirement.  The last 48 hours his hypoxia is worsened and was transitioned to 50 L high flow nasal cannula oxygen on 1/7.  Chest x-ray with worsening infiltrates at that time.  Further declined in the early hours of 05/16/2019 and the decision was made to intubate him and transfer him to ICU care.  History of present illness   69 year old gentleman with history of chronic atrial fibrillation on Xarelto, sick sinus syndrome status post pacemaker, history of syncope and hypertension presented to the emergency room with 4 days of nonproductive cough and fever on 12/31.  His wife was symptomatic, tested and was Covid positive on December 20, but he tested negative at that time.   Initially saturating 80% on room air.  Initially required 6 L of nasal cannula oxygen now with increasing oxygen requirement.  The last 48 hours his hypoxia is worsened and was transitioned to 50 L high flow nasal cannula oxygen on 1/7.  Chest x-ray with worsening infiltrates at that time.  Further declined in the early hours of 05/16/2019 and the decision was made to intubate him and transfer him to ICU care.  Past Medical History  HTN AFib Sick sinus sydrome (pacemaker placed)  Significant Hospital Events   1/9 intubation  Consults:  PCCM  Procedures:  ETT  1/9 TLC 1/10  Significant Diagnostic Tests:  COVID + 12/31 CXR with diffuse bilateral opacities consistent with dx   Micro Data:  COVID pos HIV neg  Antimicrobials:  Cefepime 2g q8 1/4-->1/8 Cefepime 1/10-->> Vancomycine 1/10-->  Interim history/subjective:  Hypoxia stable today, Tmin 96.4, no acute events   Objective   Blood pressure 103/66, pulse (!) 58, temperature (!) 96.4 F (35.8 C), temperature source Axillary, resp. rate 19, height 5\' 10"  (1.778 m), weight 98 kg, SpO2 92 %.    Vent Mode: PRVC FiO2 (%):  [70 %-100 %] 70 % Set Rate:  [24 bmp] 24 bmp Vt Set:  [580 mL] 580 mL PEEP:  [16 cmH20] 16 cmH20 Plateau Pressure:  [25 cmH20-31 cmH20] 31 cmH20   Intake/Output Summary (Last 24 hours) at 05/17/2019 1808 Last data filed at 05/17/2019 1700 Gross per 24 hour  Intake 5407.87 ml  Output 746 ml  Net 4661.87 ml   Filed Weights   05/15/19 1959  Weight: 98 kg    Examination: General: NAD, ill-appearing HENT: AT/[place]; ETT in place, skin wear on nose/right nare Lungs: diffuse crackles and expiratory wheeze bilaterally Cardiovascular: RRR, no r/m/g Abdomen: SND Extremities: no c/c/e. PIV in each hand/forearm without erythema Neuro: sedated, PERRL, eyes midline and w/o deviation. corneal reflex intact GU: catheter  Resolved Hospital Problem list     Assessment & Plan:   Hypoxic respiratory failure secondary to COVID: PLAN:  ARDS protocol: Ppeak, driving pressure within goal Continue dexamethasone per protocol; remdesivir  CV: -telemetry, no afib currently  but will treat as necessary - continue cardizem -lovenox; given renal compromise, will convert to heparin  F/E/N: -trend labs and replace electrolytes as needed -strict I/Os -nutrition consult for tube feeding -dose of lokelma, D5 for hyperkalemia  ID: today WBC up to 25; episodically hypothermic rather than febrile; CXR looks worse, BP trending down -blood and trach aspirate for culture -institute  renal dose vanc, cefepime -with concern for developing sepsis and limited vascular access, place TLC (see procedure note)  Acute renal insufficiency: BUN/Cr essentially doubled in last 24 hrs; UOP declining with BP MAP>65, HR WNL -urine spot sodium to calc FeNa -fluid challenge -D5 and lowkelma to treat hyperkalemia -may need nephrology consult if parameters worsen    Best practice:  Diet: NPO; nutrition consult for TF Pain/Anxiety/Delirium protocol (if indicated): in place VAP protocol (if indicated): in place DVT prophylaxis: lovenox GI prophylaxis: Pepcid/PPI, HOB >30 Glucose control: SSI as needed Mobility: bed Code Status: full Family Communication: will update family Disposition: ICU  Labs   CBC: Recent Labs  Lab 05/13/19 0245 05/14/19 0200 05/15/19 0105 05/16/19 0648 05/16/19 1534 05/16/19 2248 05/17/19 0600 05/17/19 1207  WBC 36.8* 23.9* 18.9* 17.7*  --   --  25.0*  --   NEUTROABS 19.4* 13.6* 10.9* 11.5*  --   --  15.1*  --   HGB 14.9 15.2 14.7 14.8 14.6 15.3 14.5 15.0  HCT 45.4 46.1 43.9 44.9 43.0 45.0 46.1 44.0  MCV 93.2 93.9 92.8 94.5  --   --  97.7  --   PLT 146* 118* 94* 95*  --   --  96*  --     Basic Metabolic Panel: Recent Labs  Lab 05/13/19 0245 05/14/19 0200 05/15/19 0105 05/16/19 0648 05/16/19 1534 05/16/19 1824 05/16/19 2248 05/17/19 0600 05/17/19 1207  NA 142 137 136 136 138  --  139 137 139  K 4.3 4.1 4.3 5.2* 5.7*  --  5.5* 5.3* 5.3*  CL 109 108 103 105  --   --   --  104  --   CO2 23 18* 22 22  --   --   --  21*  --   GLUCOSE 60* 51* 190* 270*  --   --   --  189*  --   BUN 30* 26* 29* 42*  --   --   --  83*  --   CREATININE 0.68 0.76 0.87 0.94  --   --   --  1.96*  --   CALCIUM 8.5* 8.3* 8.3* 8.0*  --   --   --  7.7*  --   MG  --  2.1  --   --   --  2.7*  --  2.7*  --   PHOS  --  4.1  --   --   --  7.9*  --  7.5*  --    GFR: Estimated Creatinine Clearance: 43.5 mL/min (A) (by C-G formula based on SCr of 1.96 mg/dL  (H)). Recent Labs  Lab 05/11/19 0142 05/12/19 0130 05/14/19 0200 05/15/19 0105 05/16/19 0648 05/17/19 0600  PROCALCITON <0.10 <0.10  --   --   --   --   WBC 23.0* 27.2* 23.9* 18.9* 17.7* 25.0*    Liver Function Tests: Recent Labs  Lab 05/13/19 0245 05/14/19 0200 05/15/19 0105 05/16/19 0648 05/17/19 0600  AST 67* 69* 69* 52* 35  ALT 57* 88* 105* 79* 62*  ALKPHOS 153* 188* 195* 218* 200*  BILITOT 1.1 1.1 1.4* 1.3*  1.2  PROT 6.3* 6.2* 6.1* 5.5* 6.0*  ALBUMIN 3.4* 3.3* 3.3* 3.1* 3.4*   No results for input(s): LIPASE, AMYLASE in the last 168 hours. No results for input(s): AMMONIA in the last 168 hours.  ABG    Component Value Date/Time   PHART 7.260 (L) 05/17/2019 1207   PCO2ART 48.4 (H) 05/17/2019 1207   PO2ART 74.0 (L) 05/17/2019 1207   HCO3 21.7 05/17/2019 1207   TCO2 23 05/17/2019 1207   ACIDBASEDEF 6.0 (H) 05/17/2019 1207   O2SAT 92.0 05/17/2019 1207     Coagulation Profile: No results for input(s): INR, PROTIME in the last 168 hours.  Cardiac Enzymes: No results for input(s): CKTOTAL, CKMB, CKMBINDEX, TROPONINI in the last 168 hours.  HbA1C: Hgb A1c MFr Bld  Date/Time Value Ref Range Status  05/09/2019 02:17 AM 8.5 (H) 4.8 - 5.6 % Final    Comment:    (NOTE) Pre diabetes:          5.7%-6.4% Diabetes:              >6.4% Glycemic control for   <7.0% adults with diabetes   10/06/2015 03:47 PM 6.5 (H) 4.8 - 5.6 % Final    Comment:             Pre-diabetes: 5.7 - 6.4          Diabetes: >6.4          Glycemic control for adults with diabetes: <7.0     CBG: Recent Labs  Lab 05/16/19 2035 05/17/19 0418 05/17/19 0756 05/17/19 1203 05/17/19 1720  GLUCAP 150* 206* 169* 191* 157*    Review of Systems:   Unable to obtain secondary to intubation/sedation  Past Medical History  He,  has a past medical history of Atrial fibrillation ([place]), Hyperlipidemia, Hypertension, Motion sickness, PPM-[employer] (10/11/2009), Sick sinus syndrome ([place]), Syncope  and collapse, and Wears dentures.   Surgical History    Past Surgical History:  Procedure Laterality Date  . COLONOSCOPY WITH PROPOFOL N/A 03/25/2015   Procedure: COLONOSCOPY WITH PROPOFOL;  Surgeon: Lucilla Lame, MD;  Location: [facility];  Service: Endoscopy;  Laterality: N/A;  ULCER ASCENDING COLON BX.   Randolm Idol / REPLACE / REMOVE PACEMAKER  09/04/07   [employer]     Social History   reports that he has never smoked. He has never used smokeless tobacco. He reports that he does not drink alcohol or use drugs.   Family History   His family history includes Diabetes in his mother; Heart attack in his father.   Allergies Allergies  Allergen Reactions  . Atenolol Rash     Home Medications  Prior to Admission medications   Medication Sig Start Date End Date Taking? Authorizing Provider  atorvastatin (LIPITOR) 10 MG tablet TAKE 1 TABLET(10 MG) BY MOUTH DAILY 05/08/18  Yes Gollan, Kathlene November, MD  diltiazem (CARDIZEM CD) 240 MG 24 hr capsule Take 1 capsule (240 mg total) by mouth daily. 01/01/19  Yes Gollan, Kathlene November, MD  fish oil-omega-3 fatty acids 1000 MG capsule Take 1 g by mouth 2 (two) times daily.     Yes [provider]  propranolol ER (INDERAL LA) 80 MG 24 hr capsule Take 1 capsule (80 mg total) by mouth daily. 01/01/19  Yes Gollan, Kathlene November, MD  XARELTO 20 MG TABS tablet TAKE 1 TABLET BY MOUTH ONCE DAILY WITH SUPPER 12/23/18  Yes Gollan, Kathlene November, MD  lisinopril (ZESTRIL) 20 MG tablet Take 1 tablet (20 mg total) by  mouth daily. 12/22/18 03/22/19  Antonieta Iba, MD     Critical care time: I have independently seen and examined the patient, reviewed data, and developed an assessment and plan. A total of 70 minutes were spent in critical care assessment and medical decision making. This critical care time does not reflect procedure time, or teaching time or supervisory time of PA/NP/Med student/Med Resident, etc but could involve care discussion time.   Gwynne Edinger, MD PhD 05/17/19 6:20 PM

## 2019-05-17 NOTE — Progress Notes (Signed)
ICU CN spoke with wife and provided an update on current plan of care and unit procedures. All questions welcomed and answered. Emotional support provided. Elink visit arranged at 3:00pm today. Primary RN notified. Dr. Ollen Bowl and Dr. Tonia Brooms notified of request for update from the team.

## 2019-05-18 ENCOUNTER — Inpatient Hospital Stay (HOSPITAL_COMMUNITY): Payer: Private Health Insurance - Indemnity

## 2019-05-18 DIAGNOSIS — U071 COVID-19: Principal | ICD-10-CM

## 2019-05-18 DIAGNOSIS — N179 Acute kidney failure, unspecified: Secondary | ICD-10-CM

## 2019-05-18 DIAGNOSIS — J1282 Pneumonia due to coronavirus disease 2019: Secondary | ICD-10-CM

## 2019-05-18 DIAGNOSIS — J80 Acute respiratory distress syndrome: Secondary | ICD-10-CM

## 2019-05-18 LAB — CBC WITH DIFFERENTIAL/PLATELET
Abs Immature Granulocytes: 0.13 10*3/uL — ABNORMAL HIGH (ref 0.00–0.07)
Basophils Absolute: 0 10*3/uL (ref 0.0–0.1)
Basophils Relative: 0 %
Eosinophils Absolute: 0 10*3/uL (ref 0.0–0.5)
Eosinophils Relative: 0 %
HCT: 41 % (ref 39.0–52.0)
Hemoglobin: 12.6 g/dL — ABNORMAL LOW (ref 13.0–17.0)
Immature Granulocytes: 1 %
Lymphocytes Relative: 33 %
Lymphs Abs: 5.5 10*3/uL — ABNORMAL HIGH (ref 0.7–4.0)
MCH: 30.4 pg (ref 26.0–34.0)
MCHC: 30.7 g/dL (ref 30.0–36.0)
MCV: 99 fL (ref 80.0–100.0)
Monocytes Absolute: 0.5 10*3/uL (ref 0.1–1.0)
Monocytes Relative: 3 %
Neutro Abs: 10.6 10*3/uL — ABNORMAL HIGH (ref 1.7–7.7)
Neutrophils Relative %: 63 %
Platelets: 66 10*3/uL — ABNORMAL LOW (ref 150–400)
RBC: 4.14 MIL/uL — ABNORMAL LOW (ref 4.22–5.81)
RDW: 14.7 % (ref 11.5–15.5)
WBC: 16.8 10*3/uL — ABNORMAL HIGH (ref 4.0–10.5)
nRBC: 0 % (ref 0.0–0.2)

## 2019-05-18 LAB — POCT I-STAT 7, (LYTES, BLD GAS, ICA,H+H)
Acid-base deficit: 8 mmol/L — ABNORMAL HIGH (ref 0.0–2.0)
Bicarbonate: 18 mmol/L — ABNORMAL LOW (ref 20.0–28.0)
Calcium, Ion: 1.14 mmol/L — ABNORMAL LOW (ref 1.15–1.40)
HCT: 39 % (ref 39.0–52.0)
Hemoglobin: 13.3 g/dL (ref 13.0–17.0)
O2 Saturation: 80 %
Patient temperature: 38
Potassium: 5.6 mmol/L — ABNORMAL HIGH (ref 3.5–5.1)
Sodium: 139 mmol/L (ref 135–145)
TCO2: 19 mmol/L — ABNORMAL LOW (ref 22–32)
pCO2 arterial: 40.4 mmHg (ref 32.0–48.0)
pH, Arterial: 7.263 — ABNORMAL LOW (ref 7.350–7.450)
pO2, Arterial: 54 mmHg — ABNORMAL LOW (ref 83.0–108.0)

## 2019-05-18 LAB — GLUCOSE, CAPILLARY
Glucose-Capillary: 140 mg/dL — ABNORMAL HIGH (ref 70–99)
Glucose-Capillary: 158 mg/dL — ABNORMAL HIGH (ref 70–99)
Glucose-Capillary: 158 mg/dL — ABNORMAL HIGH (ref 70–99)
Glucose-Capillary: 164 mg/dL — ABNORMAL HIGH (ref 70–99)
Glucose-Capillary: 168 mg/dL — ABNORMAL HIGH (ref 70–99)
Glucose-Capillary: 183 mg/dL — ABNORMAL HIGH (ref 70–99)
Glucose-Capillary: 297 mg/dL — ABNORMAL HIGH (ref 70–99)
Glucose-Capillary: 300 mg/dL — ABNORMAL HIGH (ref 70–99)
Glucose-Capillary: 309 mg/dL — ABNORMAL HIGH (ref 70–99)

## 2019-05-18 LAB — CBC
HCT: 42.3 % (ref 39.0–52.0)
Hemoglobin: 13.2 g/dL (ref 13.0–17.0)
MCH: 30.9 pg (ref 26.0–34.0)
MCHC: 31.2 g/dL (ref 30.0–36.0)
MCV: 99.1 fL (ref 80.0–100.0)
Platelets: 68 10*3/uL — ABNORMAL LOW (ref 150–400)
RBC: 4.27 MIL/uL (ref 4.22–5.81)
RDW: 14.8 % (ref 11.5–15.5)
WBC: 15 10*3/uL — ABNORMAL HIGH (ref 4.0–10.5)
nRBC: 0 % (ref 0.0–0.2)

## 2019-05-18 LAB — MAGNESIUM: Magnesium: 2.9 mg/dL — ABNORMAL HIGH (ref 1.7–2.4)

## 2019-05-18 LAB — COMPREHENSIVE METABOLIC PANEL
ALT: 42 U/L (ref 0–44)
AST: 33 U/L (ref 15–41)
Albumin: 2.7 g/dL — ABNORMAL LOW (ref 3.5–5.0)
Alkaline Phosphatase: 155 U/L — ABNORMAL HIGH (ref 38–126)
Anion gap: 11 (ref 5–15)
BUN: 97 mg/dL — ABNORMAL HIGH (ref 8–23)
CO2: 22 mmol/L (ref 22–32)
Calcium: 7.4 mg/dL — ABNORMAL LOW (ref 8.9–10.3)
Chloride: 108 mmol/L (ref 98–111)
Creatinine, Ser: 1.83 mg/dL — ABNORMAL HIGH (ref 0.61–1.24)
GFR calc Af Amer: 44 mL/min — ABNORMAL LOW (ref >60)
GFR calc non Af Amer: 38 mL/min — ABNORMAL LOW (ref >60)
Glucose, Bld: 164 mg/dL — ABNORMAL HIGH (ref 70–99)
Potassium: 5.1 mmol/L (ref 3.5–5.1)
Sodium: 141 mmol/L (ref 135–145)
Total Bilirubin: 0.8 mg/dL (ref 0.3–1.2)
Total Protein: 4.9 g/dL — ABNORMAL LOW (ref 6.5–8.1)

## 2019-05-18 LAB — HEPARIN LEVEL (UNFRACTIONATED)
Heparin Unfractionated: 2.2 IU/mL — ABNORMAL HIGH (ref 0.30–0.70)
Heparin Unfractionated: 1.56 IU/mL — ABNORMAL HIGH (ref 0.30–0.70)

## 2019-05-18 LAB — PHOSPHORUS: Phosphorus: 6.6 mg/dL — ABNORMAL HIGH (ref 2.5–4.6)

## 2019-05-18 MED ORDER — INSULIN DETEMIR 100 UNIT/ML ~~LOC~~ SOLN
10.0000 [IU] | Freq: Two times a day (BID) | SUBCUTANEOUS | Status: DC
  Administered 2019-05-18 – 2019-05-19 (×2): 10 [IU] via SUBCUTANEOUS
  Filled 2019-05-18 (×3): qty 0.1

## 2019-05-18 MED ORDER — ACETAMINOPHEN 160 MG/5ML PO SOLN
650.0000 mg | Freq: Four times a day (QID) | ORAL | Status: DC | PRN
  Administered 2019-05-18 – 2019-05-19 (×2): 650 mg
  Filled 2019-05-18 (×2): qty 20.3

## 2019-05-18 MED ORDER — ARTIFICIAL TEARS OPHTHALMIC OINT
1.0000 "application " | TOPICAL_OINTMENT | Freq: Three times a day (TID) | OPHTHALMIC | Status: DC
  Administered 2019-05-18 – 2019-05-22 (×11): 1 via OPHTHALMIC
  Filled 2019-05-18 (×2): qty 3.5

## 2019-05-18 MED ORDER — SODIUM CHLORIDE 0.9 % IV SOLN: 0.5000 ug/kg/min | INTRAVENOUS | Status: DC

## 2019-05-18 MED ORDER — CISATRACURIUM BESYLATE 20 MG/10ML IV SOLN
0.1000 mg/kg | INTRAVENOUS | Status: DC | PRN
  Administered 2019-05-18 – 2019-05-21 (×16): 9.8 mg via INTRAVENOUS
  Filled 2019-05-18 (×7): qty 10

## 2019-05-18 MED ORDER — NEPRO/CARBSTEADY PO LIQD
1000.0000 mL | ORAL | Status: DC
  Administered 2019-05-18 – 2019-05-21 (×3): 1000 mL
  Filled 2019-05-18 (×5): qty 1000

## 2019-05-18 MED ORDER — MIDAZOLAM BOLUS VIA INFUSION
1.0000 mg | INTRAVENOUS | Status: DC | PRN
  Administered 2019-05-19: 2 mg via INTRAVENOUS
  Administered 2019-05-21: 1 mg via INTRAVENOUS
  Filled 2019-05-18: qty 2

## 2019-05-18 MED ORDER — B COMPLEX-C PO TABS
1.0000 | ORAL_TABLET | Freq: Every day | ORAL | Status: DC
  Administered 2019-05-18 – 2019-05-21 (×4): 1
  Filled 2019-05-18 (×6): qty 1

## 2019-05-18 MED ORDER — PRO-STAT SUGAR FREE PO LIQD
60.0000 mL | Freq: Two times a day (BID) | ORAL | Status: DC
  Administered 2019-05-18 – 2019-05-22 (×8): 60 mL
  Filled 2019-05-18 (×7): qty 60

## 2019-05-18 MED ORDER — IPRATROPIUM-ALBUTEROL 0.5-2.5 (3) MG/3ML IN SOLN
3.0000 mL | Freq: Four times a day (QID) | RESPIRATORY_TRACT | Status: DC | PRN
  Administered 2019-05-21: 09:00:00 3 mL via RESPIRATORY_TRACT
  Filled 2019-05-18: qty 3

## 2019-05-18 MED ORDER — MIDAZOLAM 50MG/50ML (1MG/ML) PREMIX INFUSION
0.0000 mg/h | INTRAVENOUS | Status: DC
  Administered 2019-05-18: 9 mg/h via INTRAVENOUS
  Administered 2019-05-18: 2 mg/h via INTRAVENOUS
  Administered 2019-05-19 – 2019-05-21 (×9): 9 mg/h via INTRAVENOUS
  Filled 2019-05-18 (×14): qty 50

## 2019-05-18 MED ORDER — DEXMEDETOMIDINE HCL IN NACL 400 MCG/100ML IV SOLN
0.4000 ug/kg/h | INTRAVENOUS | Status: DC
  Administered 2019-05-18: 23:00:00 1 ug/kg/h via INTRAVENOUS
  Administered 2019-05-19: 0.8 ug/kg/h via INTRAVENOUS
  Administered 2019-05-19: 1 ug/kg/h via INTRAVENOUS
  Filled 2019-05-18 (×3): qty 100

## 2019-05-18 NOTE — Progress Notes (Signed)
Assisted tele visit to patient with wife and daughter  .  Everlee Quakenbush M Fajr Fife, RN   

## 2019-05-18 NOTE — Plan of Care (Signed)
Pt intubated and sedated, unable to update pt on POC

## 2019-05-18 NOTE — Progress Notes (Signed)
PT Cancellation Note  Patient Details Name: Max Lozano MRN: [id number] DOB: Feb 13, 1951   Cancelled Treatment:    Reason Eval/Treat Not Completed: Medical issues which prohibited therapy, moved to ICU and now on vent. Will follow.    Rada Hay 05/18/2019, 7:11 AM Blanchard Kelch PT Acute Rehabilitation Services Pager [phone] Office [phone]

## 2019-05-18 NOTE — Progress Notes (Addendum)
ANTICOAGULATION CONSULT NOTE - Follow Up Consult  Pharmacy Consult for Heparin IV Indication: DVT (failed rivaroxaban), Afib  Allergies  Allergen Reactions  . Atenolol Rash    Patient Measurements: Height: 5\' 10"  (177.8 cm) Weight: (pt proned unable to obtain pt weight on bed scale) IBW/kg (Calculated) : 73 Heparin Dosing Weight: 93 kg   Vital Signs: Temp: 97.8 F (36.6 C) (01/10 2345) Temp Source: Axillary (01/10 2345) BP: 95/59 (01/11 0600) Pulse Rate: 62 (01/11 0600)  Labs: Recent Labs    05/16/19 0648 05/17/19 0600 05/17/19 1207 05/17/19 1900 05/18/19 0325  HGB 14.8 14.5 15.0  --  12.6*  HCT 44.9 46.1 44.0  --  41.0  PLT 95* 96*  --   --  66*  HEPARINUNFRC  --   --   --   --  2.20*  HEPRLOWMOCWT  --   --   --  >2.00  --   CREATININE 0.94 1.96*  --   --  1.83*    Estimated Creatinine Clearance: 46.6 mL/min (A) (by C-G formula based on SCr of 1.83 mg/dL (H)).   Medications:  Infusions:  . ceFEPime (MAXIPIME) IV Stopped (05/18/19 0331)  . dexmedetomidine (PRECEDEX) IV infusion 0.8 mcg/kg/hr (05/18/19 0700)  . fentaNYL infusion INTRAVENOUS 350 mcg/hr (05/18/19 0700)  . lactated ringers 100 mL/hr at 05/18/19 0700  . norepinephrine (LEVOPHED) Adult infusion Stopped (05/17/19 1731)  . [START ON 05/19/2019] vancomycin      Assessment: 68 yoM admitted on 1/1 with COVID-19 pneumonia.  PTA medications include rivaroxaban for AFib.  Initially continued rivaroxaban (05/30/2019-05/10/19), then changed to full dose Lovenox (1/4 - 1/10) for new DVT found on LE dopplers on 05/11/19.  Pharmacy is now consulted to transition from Lovenox to Heparin IV due to worsening renal function.  Last Lovenox dose 1/10 at 9am. Anti-Xa level remains elevated at 2.2 SCr remains elevated at 1.83 CBC: Hgb decreased (14.5 > 12.6) and Plt continue to decrease (96 > 66)   Goal of Therapy:  Heparin level 0.3-0.7 units/ml Monitor platelets by anticoagulation protocol: Yes   Plan:  Continue to  trend anti-Xa levels.  Next level in 12 hours at 1500. When anti-Xa level is decreased, initiate IV heparin.  07/09/19 PharmD, BCPS Clinical pharmacist phone 7am- 5pm: [phone] 05/18/2019 7:52 AM   Addendum: Repeat heparin level pending Upon supination, the patient was noted to have copious frothy, bloody secretions in ET tube. Per Dr. 07/16/2019, d/c heparin consult. Follow up plans for anticoagulation.  Vassie Loll PharmD, BCPS Clinical pharmacist phone 7am- 5pm: [phone] 05/18/2019 3:22 PM

## 2019-05-18 NOTE — Progress Notes (Addendum)
NAME:  Max Lozano, MRN:  [id number], DOB:  Sep 25, 1950, LOS: 10 ADMISSION DATE:  May 31, 2019, CONSULTATION DATE:  05/16/2019 REFERRING MD:  Lyndel Safe, CHIEF COMPLAINT:  Hypoxia/respiratory failure   Brief History   69 year old gentleman with history of chronic AF on Xarelto, sick sinus syndrome status post pacemaker, syncope and hypertension presented to the ER 1/1 with 4 days of nonproductive cough and fever on 12/31. His wife was symptomatic, tested and was COVID positive on December 20, but he tested negative at that time.   Initially saturating 80% on room air, required 6 L of nasal cannula oxygen. Developed worsening O2 needs 1/7 and was transitioned to 50L HFNC oxygen.  CXR also noted to have worsening infiltrates at that time.  Further declined in the early hours of 05/16/2019 and the decision was made to intubate him and transfer him to ICU care.  Past Medical History  HTN AFib Sick sinus sydrome (pacemaker placed)  Significant Hospital Events   1/01 Admit  1/07 Worsening infiltrates, O2 needs 1/09 Tx to ICU, intubated  Consults:  PCCM  Procedures:  ETT 1/9 >> TLC 1/10 >>  Significant Diagnostic Tests:  COVID + 12/31 CXR with diffuse bilateral opacities consistent with dx  Micro Data:  COVID 1/1 >> positive HIV 1/1 >> neg BCx2 1/10 >>  Tracheal Aspirate 1/10 >>   Antimicrobials:  Cefepime 1/4 >> 1/8 Cefepime 1/10 >> Vanco 1/10 >>  Interim history/subjective:  Tmax 100.6 / WBC 16.8  PEEP 16 / fiO2 60%, Peak airway pressure 37, Pplat 35.   Remains in AF (chronic) RT concerned for pink frothy secretions  Mild bloody oral secretions  Hypothermic early am, placed on Bair Hugger Remains on levophed at 3, precedex 0.9, fentanyl 375  Objective   Blood pressure 106/67, pulse (!) 58, temperature (!) 100.6 F (38.1 C), resp. rate (!) 21, height 5\' 10"  (1.778 m), weight 98 kg, SpO2 97 %. CVP:  [7 mmHg-42 mmHg] 15 mmHg  Vent Mode: PRVC FiO2 (%):  [60 %-80 %] 60 % Set Rate:   [24 bmp] 24 bmp Vt Set:  [580 mL] 580 mL PEEP:  [16 cmH20] 16 cmH20 Plateau Pressure:  [28 cmH20-35 cmH20] 35 cmH20   Intake/Output Summary (Last 24 hours) at 05/18/2019 1453 Last data filed at 05/18/2019 0700 Gross per 24 hour  Intake 4081.33 ml  Output 975 ml  Net 3106.33 ml   Filed Weights   05/15/19 1959  Weight: 98 kg    Examination: General: adult male lying in bed, critically ill appearing, prone  HEENT: MM pink/moist, ETT, bloody oral secretions  Neuro: sedate CV: s1s2 irr irr, AF on monitor , no m/r/g PULM:  Non-labored, lungs bilaterally distant  GI: soft, bsx4 active  Extremities: warm/dry, no edema  Skin: no rashes or lesions  Resolved Hospital Problem list     Assessment & Plan:   Acute Hypoxic Respiratory Failure secondary to COVID with ARDS  Hemoptysis  Completed remdesivir, tocilizumab. New bleeding on -low Vt ventilation 4-8cc/kg -goal plateau pressure <30, driving pressure 07/13/19 cm <17 -target PaO2 55-65, titrate PEEP/FiO2 per ARDS protocol  -if P/F ratio <150, consider prone therapy for 16 hours per day -goal CVP <4, diuresis as necessary -VAP prevention measures  -follow intermittent CXR  -continue decadron   Need for Sedation due to Mechanical Ventilation  -PAD protocol  -change to versed from precedex -continue fentanyl gtt  -RASS goal -2 to -3  Chronic AF Changed from lovenox to heparin gtt due to AKI.   -  continue tele monitoring  -heparin gtt per pharmacy when appropriate, note unfractionate heparin level remains elevated  -monitor for bleeding  -Hold home propanolol, xarelto  AKI  -Hold further diuresis 1/11 with AKI & some improvement with fluid challenge 1/10 -continue LR, reduce to 32ml/hr.  Consider stopping IVF in am 1/12.  -Trend BMP / urinary output -Replace electrolytes as indicated -Avoid nephrotoxic agents, ensure adequate renal perfusion  Thrombocytopenia  -monitor for further bleeding  -heparin on hold as above  -no  role for transfusion at this time   Leukocytosis  Hypothermia  -follow cultures  -continue abx as above   Hyperglycemia  -SSI, moderate scale  -increase levemir 10 units BID   HTN HLD -hold home lisinopril -continue lipitor   Best practice:  Diet: NPO; TF Pain/Anxiety/Delirium protocol (if indicated): in place VAP protocol (if indicated): in place DVT prophylaxis: lovenox GI prophylaxis: Pepcid/PPI, HOB >30 Glucose control: SSI as needed Mobility: BR Code Status: full code  Family Communication: Wife updated 1/11 via phone.  Disposition: ICU  Labs   CBC: Recent Labs  Lab 05/14/19 0200 05/15/19 0105 05/16/19 0648 05/16/19 1534 05/16/19 2248 05/17/19 0600 05/17/19 1207 05/18/19 0325  WBC 23.9* 18.9* 17.7*  --   --  25.0*  --  16.8*  NEUTROABS 13.6* 10.9* 11.5*  --   --  15.1*  --  10.6*  HGB 15.2 14.7 14.8 14.6 15.3 14.5 15.0 12.6*  HCT 46.1 43.9 44.9 43.0 45.0 46.1 44.0 41.0  MCV 93.9 92.8 94.5  --   --  97.7  --  99.0  PLT 118* 94* 95*  --   --  96*  --  66*    Basic Metabolic Panel: Recent Labs  Lab 05/14/19 0200 05/15/19 0105 05/16/19 0648 05/16/19 1534 05/16/19 1824 05/16/19 2248 05/17/19 0600 05/17/19 1207 05/17/19 1700 05/18/19 0325  NA 137 136 136 138  --  139 137 139  --  141  K 4.1 4.3 5.2* 5.7*  --  5.5* 5.3* 5.3*  --  5.1  CL 108 103 105  --   --   --  104  --   --  108  CO2 18* 22 22  --   --   --  21*  --   --  22  GLUCOSE 51* 190* 270*  --   --   --  189*  --   --  164*  BUN 26* 29* 42*  --   --   --  83*  --   --  97*  CREATININE 0.76 0.87 0.94  --   --   --  1.96*  --   --  1.83*  CALCIUM 8.3* 8.3* 8.0*  --   --   --  7.7*  --   --  7.4*  MG 2.1  --   --   --  2.7*  --  2.7*  --  2.7* 2.9*  PHOS 4.1  --   --   --  7.9*  --  7.5*  --  6.9* 6.6*   GFR: Estimated Creatinine Clearance: 46.6 mL/min (A) (by C-G formula based on SCr of 1.83 mg/dL (H)). Recent Labs  Lab 05/12/19 0130 05/15/19 0105 05/16/19 0648 05/17/19 0600  05/18/19 0325  PROCALCITON <0.10  --   --   --   --   WBC 27.2* 18.9* 17.7* 25.0* 16.8*    Liver Function Tests: Recent Labs  Lab 05/14/19 0200 05/15/19 0105 05/16/19 0648 05/17/19 0600 05/18/19 0325  AST 69* 69* 52* 35 33  ALT 88* 105* 79* 62* 42  ALKPHOS 188* 195* 218* 200* 155*  BILITOT 1.1 1.4* 1.3* 1.2 0.8  PROT 6.2* 6.1* 5.5* 6.0* 4.9*  ALBUMIN 3.3* 3.3* 3.1* 3.4* 2.7*   No results for input(s): LIPASE, AMYLASE in the last 168 hours. No results for input(s): AMMONIA in the last 168 hours.  ABG    Component Value Date/Time   PHART 7.260 (L) 05/17/2019 1207   PCO2ART 48.4 (H) 05/17/2019 1207   PO2ART 74.0 (L) 05/17/2019 1207   HCO3 21.7 05/17/2019 1207   TCO2 23 05/17/2019 1207   ACIDBASEDEF 6.0 (H) 05/17/2019 1207   O2SAT 92.0 05/17/2019 1207     Coagulation Profile: No results for input(s): INR, PROTIME in the last 168 hours.  Cardiac Enzymes: No results for input(s): CKTOTAL, CKMB, CKMBINDEX, TROPONINI in the last 168 hours.  HbA1C: Hgb A1c MFr Bld  Date/Time Value Ref Range Status  05/09/2019 02:17 AM 8.5 (H) 4.8 - 5.6 % Final    Comment:    (NOTE) Pre diabetes:          5.7%-6.4% Diabetes:              >6.4% Glycemic control for   <7.0% adults with diabetes   10/06/2015 03:47 PM 6.5 (H) 4.8 - 5.6 % Final    Comment:             Pre-diabetes: 5.7 - 6.4          Diabetes: >6.4          Glycemic control for adults with diabetes: <7.0     CBG: Recent Labs  Lab 05/17/19 2038 05/18/19 0002 05/18/19 0330 05/18/19 0750 05/18/19 1201  GLUCAP 193* 164* 168* 140* 183*     Critical care time: 34 minutes   Noe Gens, MSN, NP-C [facility] 05/18/2019, 3:03 PM   Please see [email or web address] for pager details.

## 2019-05-18 NOTE — Progress Notes (Signed)
Pt placed supine at this time with no complications. ETT remains secure in proper position with cloth tape.

## 2019-05-18 NOTE — Progress Notes (Signed)
Pt placed in prone position.  Foam pads applied to checks, forehead, and upper lip.  ETT secured at 24 with cloth tape.  No complications.

## 2019-05-18 NOTE — Progress Notes (Signed)
Initial Nutrition Assessment  RD working remotely.   DOCUMENTATION CODES:   Not applicable  INTERVENTION:  - will adjust TF regimen: Nepro @ 35 ml/hr with 60 ml prostat BID. - this regimen will provide 1912 kcal (97.5% estimated kcal need), 128 grams protein, 805 mg Phos, and 611 ml free water.  - free water flush, if desired, to be per MD/NP.    NUTRITION DIAGNOSIS:   Inadequate oral intake related to inability to eat as evidenced by NPO status.   GOAL:   Patient will meet greater than or equal to 90% of their needs  MONITOR:   Vent status, TF tolerance, Labs, Weight trends  REASON FOR ASSESSMENT:   Ventilator, Consult Enteral/tube feeding initiation and management  ASSESSMENT:   69 year old male with medical history of chronic afib on Xarelto, sick sinus syndrome s/p pacemaker, history of syncope, and HTN presented. He presented to the ED on 12/31 with a 4 day hx of non-productive cough and fever. His wife had tested positive for COVID on 12/20, but he tested negative at that time. He was initially saturating at 80% on room air and required 6L [place]. His respiratory status worsened to the point of him needing 50L HFNC on 1/7. CXR on 1/7 showed worsening infiltrates. He further declined in the AM on 1/9 and he was intubated and moved to ICU at that time.  Patient remains intubated since 1/9 with OGT in place and he has been receiving TF per protocol since 1/9: Vital High Protein @ 40 ml/hr with 30 ml prostat BID. This regimen provides 1160 kcal, 114 grams protein, 868 mg Phos, and 802 ml free water.   Per chart review, current weight is 216 lb. Weight on 12/22/18 was 214 lb. Unsure of any weight changes between August and now.   Per notes: - hypoxic respiratory failure 2/2 COVID--ARDS protocol - worsening CXR, BP trending down - acute renal insufficiency--BUN and creatinine doubled in 24 hours; possible need for Nephrology consult   Patient is currently intubated on  ventilator support MV: 13.5 L/min Temp (24hrs), Avg:98.1 F (36.7 C), Min:95.5 F (35.3 C), Max:100.8 F (38.2 C) Propofol: none    Labs reviewed; CBGs: 164, 168, 140, and 183 mg/dl, BUN: 97 mg/dl, creatinine: 1.83 mg/dl, Ca: 7.4 mg/dl, Phos: 6.6 mg/dl, Mg: 2.9 mg/dl, alk phos elevated, GFR: 38 ml/min. Medications reviewed; 500 mg ascorbic acid/day, 20 mg pepcid BID, sliding scale novolog, 8 units levemir BID, 220 mg zinc sulfate/day.  IVF; LR @ 100 ml/hr. Drips; precedex @ 0.9 mcg/kg/hr; fentanyl @ 350 mcg/hr; levo @ 2 mcg/min    NUTRITION - FOCUSED PHYSICAL EXAM:  unable to complete while patient is at [facility].  Diet Order:   Diet Order    None      EDUCATION NEEDS:   No education needs have been identified at this time  Skin:  Skin Assessment: Reviewed RN Assessment  Last BM:  PTA/unknown  Height:   Ht Readings from Last 1 Encounters:  05/16/19 5' 10"  (1.778 m)    Weight:   Wt Readings from Last 1 Encounters:  05/15/19 98 kg    Ideal Body Weight:  75.4 kg  BMI:  Body mass index is 31 kg/m.  Estimated Nutritional Needs:   Kcal:  1960 kcal (20 kcal/kg)  Protein:  118-147 grams (1.2-1.5 grams/kg)  Fluid:  >/= 2 L/day     Jarome Matin, MS, RD, LDN, [facility] Inpatient Clinical Dietitian Pager # [phone] After hours/weekend pager # [phone]

## 2019-05-18 NOTE — Progress Notes (Signed)
RN messaged Dr. Dwana Curd due to patient double stacking and not synchronizing at all on vent, O2 82-84%, RN aware and at bedside

## 2019-05-18 NOTE — Plan of Care (Signed)
Pt proned during shift.  During head turn, cardiac monitor showed pt in Afib (which pt has hx of Afib).  Pt has maintained rate 60s to 110s with occasional PVC.  Prior to this rhythm, pt had been in A-Paced at rate of 60-62bpm.  Problem: Respiratory: Goal: Complications related to the disease process, condition or treatment will be avoided or minimized Outcome: Not Progressing

## 2019-05-18 NOTE — Progress Notes (Signed)
Pt's hear turned and arms rotated with no complications.  ETT secured  

## 2019-05-19 ENCOUNTER — Other Ambulatory Visit: Payer: Self-pay

## 2019-05-19 ENCOUNTER — Inpatient Hospital Stay (HOSPITAL_COMMUNITY): Payer: Private Health Insurance - Indemnity

## 2019-05-19 DIAGNOSIS — R042 Hemoptysis: Secondary | ICD-10-CM

## 2019-05-19 DIAGNOSIS — R58 Hemorrhage, not elsewhere classified: Secondary | ICD-10-CM

## 2019-05-19 DIAGNOSIS — J80 Acute respiratory distress syndrome: Secondary | ICD-10-CM

## 2019-05-19 LAB — CBC WITH DIFFERENTIAL/PLATELET
Abs Immature Granulocytes: 0.14 10*3/uL — ABNORMAL HIGH (ref 0.00–0.07)
Basophils Absolute: 0.1 10*3/uL (ref 0.0–0.1)
Basophils Relative: 0 %
Eosinophils Absolute: 0.1 10*3/uL (ref 0.0–0.5)
Eosinophils Relative: 0 %
HCT: 41 % (ref 39.0–52.0)
Hemoglobin: 12.6 g/dL — ABNORMAL LOW (ref 13.0–17.0)
Immature Granulocytes: 1 %
Lymphocytes Relative: 28 %
Lymphs Abs: 5.5 10*3/uL — ABNORMAL HIGH (ref 0.7–4.0)
MCH: 31 pg (ref 26.0–34.0)
MCHC: 30.7 g/dL (ref 30.0–36.0)
MCV: 101 fL — ABNORMAL HIGH (ref 80.0–100.0)
Monocytes Absolute: 0.4 10*3/uL (ref 0.1–1.0)
Monocytes Relative: 2 %
Neutro Abs: 13.3 10*3/uL — ABNORMAL HIGH (ref 1.7–7.7)
Neutrophils Relative %: 69 %
Platelets: 60 10*3/uL — ABNORMAL LOW (ref 150–400)
RBC: 4.06 MIL/uL — ABNORMAL LOW (ref 4.22–5.81)
RDW: 15.2 % (ref 11.5–15.5)
WBC: 19.4 10*3/uL — ABNORMAL HIGH (ref 4.0–10.5)
nRBC: 0.1 % (ref 0.0–0.2)

## 2019-05-19 LAB — BASIC METABOLIC PANEL
Anion gap: 9 (ref 5–15)
Anion gap: 10 (ref 5–15)
BUN: 125 mg/dL — ABNORMAL HIGH (ref 8–23)
BUN: 140 mg/dL — ABNORMAL HIGH (ref 8–23)
CO2: 24 mmol/L (ref 22–32)
CO2: 24 mmol/L (ref 22–32)
Calcium: 7.6 mg/dL — ABNORMAL LOW (ref 8.9–10.3)
Calcium: 7.5 mg/dL — ABNORMAL LOW (ref 8.9–10.3)
Chloride: 110 mmol/L (ref 98–111)
Chloride: 109 mmol/L (ref 98–111)
Creatinine, Ser: 2.45 mg/dL — ABNORMAL HIGH (ref 0.61–1.24)
Creatinine, Ser: 2.56 mg/dL — ABNORMAL HIGH (ref 0.61–1.24)
GFR calc Af Amer: 31 mL/min — ABNORMAL LOW (ref >60)
GFR calc Af Amer: 29 mL/min — ABNORMAL LOW (ref >60)
GFR calc non Af Amer: 26 mL/min — ABNORMAL LOW (ref >60)
GFR calc non Af Amer: 25 mL/min — ABNORMAL LOW (ref >60)
Glucose, Bld: 189 mg/dL — ABNORMAL HIGH (ref 70–99)
Glucose, Bld: 154 mg/dL — ABNORMAL HIGH (ref 70–99)
Potassium: 5.9 mmol/L — ABNORMAL HIGH (ref 3.5–5.1)
Potassium: 5.5 mmol/L — ABNORMAL HIGH (ref 3.5–5.1)
Sodium: 143 mmol/L (ref 135–145)
Sodium: 143 mmol/L (ref 135–145)

## 2019-05-19 LAB — COMPREHENSIVE METABOLIC PANEL
ALT: 37 U/L (ref 0–44)
AST: 40 U/L (ref 15–41)
Albumin: 2.4 g/dL — ABNORMAL LOW (ref 3.5–5.0)
Alkaline Phosphatase: 161 U/L — ABNORMAL HIGH (ref 38–126)
Anion gap: 10 (ref 5–15)
BUN: 126 mg/dL — ABNORMAL HIGH (ref 8–23)
CO2: 23 mmol/L (ref 22–32)
Calcium: 7.5 mg/dL — ABNORMAL LOW (ref 8.9–10.3)
Chloride: 108 mmol/L (ref 98–111)
Creatinine, Ser: 2.38 mg/dL — ABNORMAL HIGH (ref 0.61–1.24)
GFR calc Af Amer: 32 mL/min — ABNORMAL LOW (ref >60)
GFR calc non Af Amer: 27 mL/min — ABNORMAL LOW (ref >60)
Glucose, Bld: 296 mg/dL — ABNORMAL HIGH (ref 70–99)
Potassium: 6 mmol/L — ABNORMAL HIGH (ref 3.5–5.1)
Sodium: 141 mmol/L (ref 135–145)
Total Bilirubin: 0.7 mg/dL (ref 0.3–1.2)
Total Protein: 4.6 g/dL — ABNORMAL LOW (ref 6.5–8.1)

## 2019-05-19 LAB — GLUCOSE, CAPILLARY
Glucose-Capillary: 261 mg/dL — ABNORMAL HIGH (ref 70–99)
Glucose-Capillary: 236 mg/dL — ABNORMAL HIGH (ref 70–99)
Glucose-Capillary: 226 mg/dL — ABNORMAL HIGH (ref 70–99)
Glucose-Capillary: 184 mg/dL — ABNORMAL HIGH (ref 70–99)
Glucose-Capillary: 165 mg/dL — ABNORMAL HIGH (ref 70–99)
Glucose-Capillary: 158 mg/dL — ABNORMAL HIGH (ref 70–99)

## 2019-05-19 LAB — CBC
HCT: 36.6 % — ABNORMAL LOW (ref 39.0–52.0)
Hemoglobin: 11.3 g/dL — ABNORMAL LOW (ref 13.0–17.0)
MCH: 30.7 pg (ref 26.0–34.0)
MCHC: 30.9 g/dL (ref 30.0–36.0)
MCV: 99.5 fL (ref 80.0–100.0)
Platelets: 54 10*3/uL — ABNORMAL LOW (ref 150–400)
RBC: 3.68 MIL/uL — ABNORMAL LOW (ref 4.22–5.81)
RDW: 15 % (ref 11.5–15.5)
WBC: 14 10*3/uL — ABNORMAL HIGH (ref 4.0–10.5)
nRBC: 0 % (ref 0.0–0.2)

## 2019-05-19 LAB — HEPARIN LEVEL (UNFRACTIONATED): Heparin Unfractionated: 0.83 IU/mL — ABNORMAL HIGH (ref 0.30–0.70)

## 2019-05-19 LAB — PATHOLOGIST SMEAR REVIEW

## 2019-05-19 MED ORDER — DEXTROSE 50 % IV SOLN
1.0000 | Freq: Once | INTRAVENOUS | Status: AC
  Administered 2019-05-19: 50 mL via INTRAVENOUS
  Filled 2019-05-19: qty 50

## 2019-05-19 MED ORDER — AMIODARONE IV BOLUS ONLY 150 MG/100ML
150.0000 mg | Freq: Once | INTRAVENOUS | Status: AC
  Administered 2019-05-19: 150 mg via INTRAVENOUS
  Filled 2019-05-19: qty 100

## 2019-05-19 MED ORDER — CALCIUM GLUCONATE-NACL 1-0.675 GM/50ML-% IV SOLN
1.0000 g | Freq: Once | INTRAVENOUS | Status: AC
  Administered 2019-05-19: 1000 mg via INTRAVENOUS
  Filled 2019-05-19: qty 50

## 2019-05-19 MED ORDER — SODIUM BICARBONATE 8.4 % IV SOLN
50.0000 meq | Freq: Once | INTRAVENOUS | Status: AC
  Administered 2019-05-19: 50 meq via INTRAVENOUS
  Filled 2019-05-19: qty 50

## 2019-05-19 MED ORDER — INSULIN ASPART 100 UNIT/ML IV SOLN
10.0000 [IU] | Freq: Once | INTRAVENOUS | Status: AC
  Administered 2019-05-19: 10 [IU] via INTRAVENOUS

## 2019-05-19 MED ORDER — STERILE WATER FOR INJECTION IV SOLN
INTRAVENOUS | Status: DC
  Filled 2019-05-19 (×5): qty 850

## 2019-05-19 MED ORDER — CLONAZEPAM 1 MG PO TABS
1.0000 mg | ORAL_TABLET | Freq: Two times a day (BID) | ORAL | Status: DC
  Administered 2019-05-19 – 2019-05-20 (×3): 1 mg
  Filled 2019-05-19 (×3): qty 1

## 2019-05-19 MED ORDER — SODIUM ZIRCONIUM CYCLOSILICATE 10 G PO PACK
10.0000 g | PACK | Freq: Every day | ORAL | Status: AC
  Administered 2019-05-19 – 2019-05-20 (×2): 10 g via ORAL
  Filled 2019-05-19 (×2): qty 1

## 2019-05-19 MED ORDER — FAMOTIDINE 40 MG/5ML PO SUSR
20.0000 mg | Freq: Every day | ORAL | Status: DC
  Administered 2019-05-20 – 2019-05-22 (×3): 20 mg
  Filled 2019-05-19 (×3): qty 2.5

## 2019-05-19 MED ORDER — CLONAZEPAM 0.5 MG PO TBDP: 1.0000 mg | ORAL_TABLET | Freq: Two times a day (BID) | ORAL | Status: DC

## 2019-05-19 MED ORDER — POLYETHYLENE GLYCOL 3350 17 G PO PACK
17.0000 g | PACK | Freq: Every day | ORAL | Status: DC
  Administered 2019-05-20 – 2019-05-22 (×3): 17 g via ORAL
  Filled 2019-05-19 (×3): qty 1

## 2019-05-19 MED ORDER — AMIODARONE HCL IN DEXTROSE 360-4.14 MG/200ML-% IV SOLN
60.0000 mg/h | INTRAVENOUS | Status: AC
  Administered 2019-05-19: 60 mg/h via INTRAVENOUS
  Filled 2019-05-19: qty 200

## 2019-05-19 MED ORDER — OXYCODONE HCL 5 MG PO TABS
5.0000 mg | ORAL_TABLET | Freq: Four times a day (QID) | ORAL | Status: DC
  Administered 2019-05-19 – 2019-05-20 (×5): 5 mg
  Filled 2019-05-19 (×5): qty 1

## 2019-05-19 MED ORDER — AMIODARONE HCL IN DEXTROSE 360-4.14 MG/200ML-% IV SOLN
30.0000 mg/h | INTRAVENOUS | Status: DC
  Administered 2019-05-20 – 2019-05-22 (×5): 30 mg/h via INTRAVENOUS
  Filled 2019-05-19 (×6): qty 200

## 2019-05-19 MED ORDER — INSULIN DETEMIR 100 UNIT/ML ~~LOC~~ SOLN
12.0000 [IU] | Freq: Two times a day (BID) | SUBCUTANEOUS | Status: DC
  Administered 2019-05-19 – 2019-05-20 (×2): 12 [IU] via SUBCUTANEOUS
  Filled 2019-05-19 (×2): qty 0.12

## 2019-05-19 NOTE — Progress Notes (Signed)
Patient had episode of Afib RVR with heart rate sustained  140's-150's. Vera MD notified. Heart rate came down without intervention and patient currently SR 90'S. Will monitor

## 2019-05-19 NOTE — Progress Notes (Signed)
Pt placed supine at this time with no complications. Pt ett secured with commercial tube holder in proper position.

## 2019-05-19 NOTE — Progress Notes (Signed)
Inpatient Diabetes Program Recommendations  AACE/ADA: New Consensus Statement on Inpatient Glycemic Control (2015)  Target Ranges:  Prepandial:   less than 140 mg/dL      Peak postprandial:   less than 180 mg/dL (1-2 hours)      Critically ill patients:  140 - 180 mg/dL   Lab Results  Component Value Date   GLUCAP 236 (H) 05/19/2019   HGBA1C 8.5 (H) 05/09/2019    Review of Glycemic Control  Diabetes history: DM 2 Outpatient Diabetes medications: None Current orders for Inpatient glycemic control: Levemir 10 units bid Novolog 0-15 units Q4 hours  Decadron 6 mg Q24 hours Nepro Tube Feed 35 ml/hour BUN/Creat: 126/2.38  Inpatient Diabetes Program Recommendations:    Glucose trends elevated. Pt on Tube Feeds.  - Consider Novolog 3 units Q4 hours Tube Feed Coverage (do not give if tube feeds are stopped or held)   Thanks,  Christena Deem RN, MSN, BC-ADM Inpatient Diabetes Coordinator Team Pager [phone] (8a-5p)

## 2019-05-19 NOTE — Plan of Care (Signed)
Pt sedated and intubated, unable to update on POC 

## 2019-05-19 NOTE — Progress Notes (Signed)
Pt HR increased to 140-150's after unproning, MD Alva at bedside and notified of HR.

## 2019-05-19 NOTE — Progress Notes (Signed)
Patient placed in prone position. Arms placed in swimmers position. ETT secured with cloth tape. Patient stable. No complications. Moderate amount of frank reb blood in ETT once patient proned. Rn x4, RT x2 at bedside

## 2019-05-19 NOTE — Progress Notes (Signed)
Pt head turned to left and immediately pt started coughing up frank red blood/hemoptysis. Approximately 150 ml suctioned out. HME, ballard, and vent circuit had to be changed due to blood. Dr Vassie Loll made aware by phone. No new orders at this time

## 2019-05-19 NOTE — Progress Notes (Addendum)
Assisted RT Marisue Ivan with head turn, apx 125 ml frank red blood from EET suctioned.  MD Vassie Loll notified, no new orders at this time. 05/19/2019 1520 Coralee North, RN

## 2019-05-19 NOTE — Progress Notes (Signed)
PT Cancellation Note  Patient Details Name: Max Lozano MRN: [id number] DOB: March 05, 1951   Cancelled Treatment:    Reason Eval/Treat Not Completed: Medical issues which prohibited therapy. Remains on vent,sedated. Will follow along for medical readiness for PT to continue.   Rada Hay 05/19/2019, 6:49 AM Blanchard Kelch PT Acute Rehabilitation Services Pager [phone] Office [phone]

## 2019-05-19 NOTE — Progress Notes (Signed)
Head repositioned. Arms rotated. ETT secured 24 cm @lip . Copious amount of yellow fluids coming from patients mouth, nose.

## 2019-05-19 NOTE — Progress Notes (Addendum)
NAME:  Max Lozano, MRN:  [id number], DOB:  11-19-1950, LOS: 11 ADMISSION DATE:  May 25, 2019, CONSULTATION DATE:  05/16/2019 REFERRING MD: Dr. Lyndel Safe, CHIEF COMPLAINT:  Hypoxia/respiratory failure   Brief History   69 year old gentleman with history of chronic AF on Xarelto, sick sinus syndrome status post pacemaker, syncope and hypertension presented to the ER 1/1 with 4 days of nonproductive cough and fever on 12/31. His wife was symptomatic, tested and was COVID positive on December 20, but he tested negative at that time.   Initially saturating 80% on room air, required 6 L of nasal cannula oxygen. Developed worsening O2 needs 1/7 and was transitioned to 50L HFNC oxygen.  CXR also noted to have worsening infiltrates at that time.  Further declined in the early hours of 05/16/2019 and the decision was made to intubate him and transfer him to ICU care.  Past Medical History  HTN AFib Sick sinus sydrome (pacemaker placed)  Significant Hospital Events   1/01 Admit  1/07 Worsening infiltrates, O2 needs 1/09 Tx to ICU, intubated 1/12 Worsening metabolic acidosis, AKI, concern for worsening infiltrate due to bleeding   Consults:  PCCM  Procedures:  ETT 1/9 >> TLC 1/10 >>  Significant Diagnostic Tests:  COVID + 12/31 CXR with diffuse bilateral opacities consistent with dx  Micro Data:  COVID 1/1 >> positive HIV 1/1 >> neg BCx2 1/10 >>  Tracheal Aspirate 1/10 >>   Antimicrobials:  Cefepime 1/4 >> 1/8 Cefepime 1/10 >> Vanco 1/10 >> 1/11  Interim history/subjective:  Tmax 98.2 PEEP 16 / FiO2 90%, Peak airway pressure 37, Pplat 34  Blood tinged sputum from ETT > concern for new left sided airspace disease / may have aspirated blood Hyperkalemia on am labs TF held due to concern for bilious material from mouth Bair hugger on / off overnight  Remains on low Remains on versed, fentanyl, precedex gtt's  Objective   Blood pressure (!) 102/58, pulse (!) 59, temperature 98.2 F (36.8  C), resp. rate (!) 21, height 5\' 10"  (1.778 m), weight 98.8 kg, SpO2 96 %.    Vent Mode: PRVC FiO2 (%):  [60 %-100 %] 90 % Set Rate:  [24 bmp] 24 bmp Vt Set:  [580 mL] 580 mL PEEP:  [16 cmH20] 16 cmH20 Plateau Pressure:  [32 cmH20-33 cmH20] 33 cmH20   Intake/Output Summary (Last 24 hours) at 05/19/2019 1247 Last data filed at 05/19/2019 1200 Gross per 24 hour  Intake 4267.27 ml  Output 1445 ml  Net 2822.27 ml   Filed Weights   05/15/19 1959 05/19/19 0410  Weight: 98 kg 98.8 kg    Examination: General: critically ill appearing adult male lying in bed in NAD, prone  HEENT: MM pink/moist, ETT Neuro: sedate CV: s1s2 RRR, SR in 60's on monitor , no m/r/g PULM:  Non-labored, lungs bilaterally coarse GI: soft, bsx4 active  Extremities: warm/dry, trace generalized  edema  Skin: no rashes or lesions  Resolved Hospital Problem list     Assessment & Plan:   Acute Hypoxic Respiratory Failure secondary to COVID with ARDS  Hemoptysis  Left Aspiration PNA Completed remdesivir, tocilizumab. New bleeding from ETT 1/11, concern for possible aspiration  -low Vt ventilation 4-8cc/kg -goal plateau pressure <30, driving pressure 3/11 cm <59 -target PaO2 55-65, titrate PEEP/FiO2 per ARDS protocol  -if P/F ratio <150, consider prone therapy for 16 hours per day -goal CVP <4, diuresis as necessary -VAP prevention measures  -follow intermittent CXR  -continue decadron  -heparin gtt on hold,  monitor for further bleeding   Need for Sedation due to Mechanical Ventilation  -PAD protocol with versed, fentanyl   -discontinue precedex  -continue PT oxycodone, clonazepam  -RASS goal -2 to -3   Chronic AF Changed from lovenox to heparin gtt due to AKI.  Bleeding while on heparin.   -heparin on hold  -heparin levels remain elevated  -hold home propranolol, xarelto   AKI  Hyperkalemia Metabolic Acidosis  -insulin, dextrose, sodium bicarbonate, calcium given 1/12 am for hyperkalemia -repeat  BMP at 1400 -change LR to bicarbonate gtt at 41ml/hr for 24 hours -Trend BMP / urinary output -Replace electrolytes as indicated -Avoid nephrotoxic agents, ensure adequate renal perfusion -pending lab review, he may need Nephrology input for possible HD  Thrombocytopenia  -monitor for further bleeding  -heparin gtt on hold as above -assess peripheral smear   Leukocytosis  Hypothermia  -follow cultures -abx as above  Hyperglycemia  -SSI, moderate scale  -increase levemir to 12 units BID   HTN HLD -hold home lisinopril -continue lipitor   Best practice:  Diet: NPO; TF Pain/Anxiety/Delirium protocol (if indicated): in place VAP protocol (if indicated): in place DVT prophylaxis: lovenox GI prophylaxis: Pepcid/PPI, HOB >30 Glucose control: SSI as needed Mobility: BR Code Status: full code  Family Communication: Wife called but no answer, message left on 1/12.  Son Leroy Sea, not wife's son) called for update.  He also requested that I update his girlfriend on the phone as well. Both were updated.  He requested that I call his sister but I informed him I can only update one per family per day due to time constraints. He indicated understanding.  Will attempt to call wife back if able this afternoon.  Disposition: ICU  Labs   CBC: Recent Labs  Lab 05/15/19 0105 05/16/19 0648 05/17/19 0600 05/17/19 1207 05/18/19 0325 05/18/19 1517 05/18/19 1651 05/19/19 0509  WBC 18.9* 17.7* 25.0*  --  16.8* 15.0*  --  19.4*  NEUTROABS 10.9* 11.5* 15.1*  --  10.6*  --   --  13.3*  HGB 14.7 14.8 14.5 15.0 12.6* 13.2 13.3 12.6*  HCT 43.9 44.9 46.1 44.0 41.0 42.3 39.0 41.0  MCV 92.8 94.5 97.7  --  99.0 99.1  --  101.0*  PLT 94* 95* 96*  --  66* 68*  --  60*    Basic Metabolic Panel: Recent Labs  Lab 05/14/19 0200 05/15/19 0105 05/16/19 0648 05/16/19 1824 05/17/19 0600 05/17/19 1207 05/17/19 1700 05/18/19 0325 05/18/19 1651 05/19/19 0509  NA 137 136 136  --  137 139  --  141 139  141  K 4.1 4.3 5.2*  --  5.3* 5.3*  --  5.1 5.6* 6.0*  CL 108 103 105  --  104  --   --  108  --  108  CO2 18* 22 22  --  21*  --   --  22  --  23  GLUCOSE 51* 190* 270*  --  189*  --   --  164*  --  296*  BUN 26* 29* 42*  --  83*  --   --  97*  --  126*  CREATININE 0.76 0.87 0.94  --  1.96*  --   --  1.83*  --  2.38*  CALCIUM 8.3* 8.3* 8.0*  --  7.7*  --   --  7.4*  --  7.5*  MG 2.1  --   --  2.7* 2.7*  --  2.7* 2.9*  --   --  PHOS 4.1  --   --  7.9* 7.5*  --  6.9* 6.6*  --   --    GFR: Estimated Creatinine Clearance: 36 mL/min (A) (by C-G formula based on SCr of 2.38 mg/dL (H)). Recent Labs  Lab 05/17/19 0600 05/18/19 0325 05/18/19 1517 05/19/19 0509  WBC 25.0* 16.8* 15.0* 19.4*    Liver Function Tests: Recent Labs  Lab 05/15/19 0105 05/16/19 0648 05/17/19 0600 05/18/19 0325 05/19/19 0509  AST 69* 52* 35 33 40  ALT 105* 79* 62* 42 37  ALKPHOS 195* 218* 200* 155* 161*  BILITOT 1.4* 1.3* 1.2 0.8 0.7  PROT 6.1* 5.5* 6.0* 4.9* 4.6*  ALBUMIN 3.3* 3.1* 3.4* 2.7* 2.4*   No results for input(s): LIPASE, AMYLASE in the last 168 hours. No results for input(s): AMMONIA in the last 168 hours.  ABG    Component Value Date/Time   PHART 7.263 (L) 05/18/2019 1651   PCO2ART 40.4 05/18/2019 1651   PO2ART 54.0 (L) 05/18/2019 1651   HCO3 18.0 (L) 05/18/2019 1651   TCO2 19 (L) 05/18/2019 1651   ACIDBASEDEF 8.0 (H) 05/18/2019 1651   O2SAT 80.0 05/18/2019 1651     Coagulation Profile: No results for input(s): INR, PROTIME in the last 168 hours.  Cardiac Enzymes: No results for input(s): CKTOTAL, CKMB, CKMBINDEX, TROPONINI in the last 168 hours.  HbA1C: Hgb A1c MFr Bld  Date/Time Value Ref Range Status  05/09/2019 02:17 AM 8.5 (H) 4.8 - 5.6 % Final    Comment:    (NOTE) Pre diabetes:          5.7%-6.4% Diabetes:              >6.4% Glycemic control for   <7.0% adults with diabetes   10/06/2015 03:47 PM 6.5 (H) 4.8 - 5.6 % Final    Comment:             Pre-diabetes:  5.7 - 6.4          Diabetes: >6.4          Glycemic control for adults with diabetes: <7.0     CBG: Recent Labs  Lab 05/18/19 1953 05/18/19 2323 05/19/19 0349 05/19/19 0751 05/19/19 1159  GLUCAP 297* 309* 261* 236* 226*     Critical care time: 32 minutes   Canary Brim, MSN, NP-C [facility] 05/19/2019, 12:47 PM   Please see [email or web address] for pager details.

## 2019-05-19 NOTE — Progress Notes (Signed)
Update wife Max Lozano. Welcomed all questions and provided thorough explanation. Max Lozano expresses her appreciation for the staff providing care for her husband.

## 2019-05-20 ENCOUNTER — Inpatient Hospital Stay (HOSPITAL_COMMUNITY): Payer: Private Health Insurance - Indemnity

## 2019-05-20 LAB — POCT I-STAT 7, (LYTES, BLD GAS, ICA,H+H)
Acid-Base Excess: 1 mmol/L (ref 0.0–2.0)
Acid-base deficit: 1 mmol/L (ref 0.0–2.0)
Bicarbonate: 26.6 mmol/L (ref 20.0–28.0)
Bicarbonate: 28.9 mmol/L — ABNORMAL HIGH (ref 20.0–28.0)
Calcium, Ion: 1.16 mmol/L (ref 1.15–1.40)
Calcium, Ion: 1.11 mmol/L — ABNORMAL LOW (ref 1.15–1.40)
HCT: 34 % — ABNORMAL LOW (ref 39.0–52.0)
HCT: 32 % — ABNORMAL LOW (ref 39.0–52.0)
Hemoglobin: 11.6 g/dL — ABNORMAL LOW (ref 13.0–17.0)
Hemoglobin: 10.9 g/dL — ABNORMAL LOW (ref 13.0–17.0)
O2 Saturation: 93 %
O2 Saturation: 90 %
Patient temperature: 36.2
Patient temperature: 37.8
Potassium: 5.3 mmol/L — ABNORMAL HIGH (ref 3.5–5.1)
Potassium: 5.5 mmol/L — ABNORMAL HIGH (ref 3.5–5.1)
Sodium: 143 mmol/L (ref 135–145)
Sodium: 142 mmol/L (ref 135–145)
TCO2: 28 mmol/L (ref 22–32)
TCO2: 31 mmol/L (ref 22–32)
pCO2 arterial: 55.8 mmHg — ABNORMAL HIGH (ref 32.0–48.0)
pCO2 arterial: 65.6 mmHg (ref 32.0–48.0)
pH, Arterial: 7.283 — ABNORMAL LOW (ref 7.350–7.450)
pH, Arterial: 7.256 — ABNORMAL LOW (ref 7.350–7.450)
pO2, Arterial: 72 mmHg — ABNORMAL LOW (ref 83.0–108.0)
pO2, Arterial: 72 mmHg — ABNORMAL LOW (ref 83.0–108.0)

## 2019-05-20 LAB — BASIC METABOLIC PANEL
Anion gap: 8 (ref 5–15)
BUN: 139 mg/dL — ABNORMAL HIGH (ref 8–23)
CO2: 25 mmol/L (ref 22–32)
Calcium: 7.4 mg/dL — ABNORMAL LOW (ref 8.9–10.3)
Chloride: 105 mmol/L (ref 98–111)
Creatinine, Ser: 2.53 mg/dL — ABNORMAL HIGH (ref 0.61–1.24)
GFR calc Af Amer: 29 mL/min — ABNORMAL LOW (ref >60)
GFR calc non Af Amer: 25 mL/min — ABNORMAL LOW (ref >60)
Glucose, Bld: 160 mg/dL — ABNORMAL HIGH (ref 70–99)
Potassium: 5.1 mmol/L (ref 3.5–5.1)
Sodium: 138 mmol/L (ref 135–145)

## 2019-05-20 LAB — CBC WITH DIFFERENTIAL/PLATELET
Abs Immature Granulocytes: 0.12 10*3/uL — ABNORMAL HIGH (ref 0.00–0.07)
Basophils Absolute: 0 10*3/uL (ref 0.0–0.1)
Basophils Relative: 0 %
Eosinophils Absolute: 0.2 10*3/uL (ref 0.0–0.5)
Eosinophils Relative: 1 %
HCT: 39.2 % (ref 39.0–52.0)
Hemoglobin: 12.1 g/dL — ABNORMAL LOW (ref 13.0–17.0)
Immature Granulocytes: 1 %
Lymphocytes Relative: 23 %
Lymphs Abs: 3.9 10*3/uL (ref 0.7–4.0)
MCH: 30.8 pg (ref 26.0–34.0)
MCHC: 30.9 g/dL (ref 30.0–36.0)
MCV: 99.7 fL (ref 80.0–100.0)
Monocytes Absolute: 0.7 10*3/uL (ref 0.1–1.0)
Monocytes Relative: 4 %
Neutro Abs: 11.5 10*3/uL — ABNORMAL HIGH (ref 1.7–7.7)
Neutrophils Relative %: 71 %
Platelets: 51 10*3/uL — ABNORMAL LOW (ref 150–400)
RBC: 3.93 MIL/uL — ABNORMAL LOW (ref 4.22–5.81)
RDW: 15.1 % (ref 11.5–15.5)
WBC: 16.5 10*3/uL — ABNORMAL HIGH (ref 4.0–10.5)
nRBC: 0 % (ref 0.0–0.2)

## 2019-05-20 LAB — HEPARIN LEVEL (UNFRACTIONATED): Heparin Unfractionated: 0.5 IU/mL (ref 0.30–0.70)

## 2019-05-20 LAB — GLUCOSE, CAPILLARY
Glucose-Capillary: 160 mg/dL — ABNORMAL HIGH (ref 70–99)
Glucose-Capillary: 153 mg/dL — ABNORMAL HIGH (ref 70–99)
Glucose-Capillary: 188 mg/dL — ABNORMAL HIGH (ref 70–99)
Glucose-Capillary: 234 mg/dL — ABNORMAL HIGH (ref 70–99)
Glucose-Capillary: 310 mg/dL — ABNORMAL HIGH (ref 70–99)
Glucose-Capillary: 326 mg/dL — ABNORMAL HIGH (ref 70–99)

## 2019-05-20 LAB — PROCALCITONIN: Procalcitonin: 0.76 ng/mL

## 2019-05-20 MED ORDER — CLONAZEPAM 1 MG PO TABS
2.0000 mg | ORAL_TABLET | Freq: Two times a day (BID) | ORAL | Status: DC
  Administered 2019-05-20 – 2019-05-22 (×4): 2 mg
  Filled 2019-05-20 (×4): qty 2

## 2019-05-20 MED ORDER — SODIUM POLYSTYRENE SULFONATE 15 GM/60ML PO SUSP
30.0000 g | Freq: Once | ORAL | Status: AC
  Administered 2019-05-20: 12:00:00 30 g
  Filled 2019-05-20: qty 120

## 2019-05-20 MED ORDER — CHLORHEXIDINE GLUCONATE 0.12% ORAL RINSE (MEDLINE KIT)
15.0000 mL | Freq: Two times a day (BID) | OROMUCOSAL | Status: DC
  Administered 2019-05-20 – 2019-05-22 (×5): 15 mL via OROMUCOSAL

## 2019-05-20 MED ORDER — ORAL CARE MOUTH RINSE
15.0000 mL | OROMUCOSAL | Status: DC
  Administered 2019-05-20 – 2019-05-22 (×21): 15 mL via OROMUCOSAL

## 2019-05-20 MED ORDER — OXYCODONE HCL 5 MG PO TABS
10.0000 mg | ORAL_TABLET | Freq: Four times a day (QID) | ORAL | Status: DC
  Administered 2019-05-20 – 2019-05-22 (×7): 10 mg
  Filled 2019-05-20 (×7): qty 2

## 2019-05-20 MED ORDER — SODIUM CHLORIDE 0.9 % IV SOLN
0.3000 ug/kg | Freq: Once | INTRAVENOUS | Status: AC
  Administered 2019-05-20: 12:00:00 25.6 ug via INTRAVENOUS
  Filled 2019-05-20: qty 6.4

## 2019-05-20 MED ORDER — INSULIN DETEMIR 100 UNIT/ML ~~LOC~~ SOLN
14.0000 [IU] | Freq: Two times a day (BID) | SUBCUTANEOUS | Status: DC
  Administered 2019-05-20 – 2019-05-21 (×2): 14 [IU] via SUBCUTANEOUS
  Filled 2019-05-20 (×3): qty 0.14

## 2019-05-20 NOTE — Procedures (Signed)
Bronchoscopy Procedure Note Max Lozano [id number] Feb 08, 1951  Procedure: Bronchoscopy Indications: Diagnostic evaluation of the airways and hemoptysis  Procedure Details Consent: Risks of procedure as well as the alternatives and risks of each were explained to the (patient/caregiver).  Consent for procedure obtained. Time Out: Verified patient identification, verified procedure, site/side was marked, verified correct patient position, special equipment/implants available, medications/allergies/relevent history reviewed, required imaging and test results available.  Performed  In preparation for procedure, patient was given 100% FiO2 and bronchoscope lubricated. Sedation: versed/ fent gtt  Airway entered and the following bronchi were examined: Bronchi.  Blood noted in all lower airways, easily washed off when flushed with saline, cleared on serial flushing & suctioning suggesting that this was aspirated blood rather than alveolar hemorrhage or bleeding from airways.No proximal site of bleeding identified.  Procedures performed: BAL performed Bronchoscope removed.  , Patient placed back on 100% FiO2 at conclusion of procedure.  Then reinserted via nares - no bleeding source noted in upper airway  Evaluation Hemodynamic Status: BP stable throughout; O2 sats: transiently fell during during procedure and currently acceptable Patient's Current Condition: stable Specimens:  Sent serosanguinous fluid Complications: No apparent complications Patient did tolerate procedure well.   Max Lozano Elsworth Soho 05/20/2019

## 2019-05-20 NOTE — Progress Notes (Signed)
Pharmacy Antibiotic Note  Max Lozano is a 69 y.o. male admitted on May 20, 2019 with sepsis.  Pharmacy has been consulted for Cefepime and Vancomycin dosing.  Antibiotics were narrowed to Cefepime alone.  Of note, patient's SCr has trended up to 2.5  Plan: - Decrease to Cefepime 2 gm IV Q24 hours -Monitor CBC, renal fx, cultures and clinical progress   Height: 5\' 10"  (177.8 cm) Weight: 229 lb 15 oz (104.3 kg) IBW/kg (Calculated) : 73  Temp (24hrs), Avg:98.2 F (36.8 C), Min:96.4 F (35.8 C), Max:99.9 F (37.7 C)  Recent Labs  Lab 05/18/19 0325 05/18/19 1517 05/19/19 0509 05/19/19 1440 05/19/19 2110 05/20/19 0445  WBC 16.8* 15.0* 19.4* 14.0*  --  16.5*  CREATININE 1.83*  --  2.38* 2.45* 2.56* 2.53*    Estimated Creatinine Clearance: 34.7 mL/min (A) (by C-G formula based on SCr of 2.53 mg/dL (H)).    Allergies  Allergen Reactions  . Atenolol Rash    Antimicrobials this admission:  12/31: AZT/CTX x1 at [facility] 1/1 Actemra x1 1/2 Actemra x1 1/1 Remdesivir >> 1/4  1/4 Cefepime >> 1/8; 1/10>> 1/4 Vanco >> 1/8; 1/10>> 1/11   Microbiology results:  12/31 BCx: negf 1/5 MRSA PCR: pos 1/10 BCx: ngtd 1/10 TA: moderate yeast  Thank you for allowing pharmacy to be a part of this patient's care.  1/32 PharmD, BCPS Clinical pharmacist phone 7am- 5pm: [phone] 05/20/2019 9:23 AM

## 2019-05-20 NOTE — Progress Notes (Signed)
Placed patient in prone position. ETT secured 24cm @ lip with cloth tape. Mepliex pads placed on cheeks to prevent skin breakdown. Arms placed in swimmers position. RN x4, RT x2 at bedside. Small amount of frank red blood suctioned from ETT. No complications.

## 2019-05-20 NOTE — Procedures (Signed)
Cortrak  Person Inserting Tube:  Kendell Bane C, RD Tube Type:  Cortrak - 43 inches Tube Location:  Right nare Initial Placement:  Postpyloric Secured by: Bridle Technique Used to Measure Tube Placement:  Documented cm marking at nare/ corner of mouth Cortrak Secured At:  91 cm    Cortrak Tube Team Note:  Consult received to place a Cortrak feeding tube.   No x-ray is required. RN may begin using tube.    If the tube becomes dislodged please keep the tube and contact the Cortrak team at [email or web address] ([email or web address]) for replacement.  If after hours and replacement cannot be delayed, place a NG tube and confirm placement with an abdominal x-ray.    Kendell Bane RD, LDN, CNSC [phone] Pager [phone] After Hours Pager

## 2019-05-20 NOTE — Progress Notes (Signed)
eLink Physician-Brief Progress Note Patient Name: Max Lozano DOB: 01-31-1951 MRN: [id number]   Date of Service  05/20/2019  HPI/Events of Note  Asked to review most recent ABG.  Pt tol current vent setting, now prone, had dose nimbex prior to proning.   eICU Interventions  Increase RR 30  F/u ABG      Intervention Category Major Interventions: Acid-Base disturbance - evaluation and management  Danford Bad 05/20/2019, 9:37 PM

## 2019-05-20 NOTE — Progress Notes (Signed)
I spoke with the pt's wife and answered her questions (regarding diuresis and blood sugar). We also planned a video visit for this evening with the pt's daughter.

## 2019-05-20 NOTE — Progress Notes (Signed)
RT NOTE:  Pt turned to prone position by RT X3, RN X2. ETT secured with cloth tape and foam pads placed on cheeks and upper lip. Head turned to the left

## 2019-05-20 NOTE — Progress Notes (Signed)
NAME:  Max Lozano, MRN:  [id number], DOB:  [birth year], LOS: 12 ADMISSION DATE:  05/24/2019, CONSULTATION DATE:  05/16/2019 REFERRING MD: Dr. Lyndel Safe, CHIEF COMPLAINT:  Hypoxia/respiratory failure   Brief History   69 year old gentleman with history of chronic AF on Xarelto, sick sinus syndrome status post pacemaker, syncope and hypertension presented to the ER 1/1 with 4 days of nonproductive cough and fever on 12/31. His wife was symptomatic, tested and was COVID positive on December 20, but he tested negative at that time.   Initially saturating 80% on room air, required 6 L of nasal cannula oxygen. Developed worsening O2 needs 1/7 and was transitioned to 50L HFNC oxygen.  CXR also noted to have worsening infiltrates at that time.  Further declined in the early hours of 05/16/2019 and the decision was made to intubate him and transfer him to ICU care.  Past Medical History  HTN AFib Sick sinus sydrome (pacemaker placed)  Significant Hospital Events   1/01 Admit  1/07 Worsening infiltrates, O2 needs 1/09 Tx to ICU, intubated 1/12 Worsening metabolic acidosis, AKI, concern for worsening infiltrate on L due to bleeding  1/13 CXR worse bilaterally, bleeding from ETT.  DDAVP. FOB for hemoptysis. AKI  Consults:  PCCM  Procedures:  ETT 1/9 >> TLC 1/10 >>  Significant Diagnostic Tests:  COVID + 12/31 CXR with diffuse bilateral opacities consistent with dx  Micro Data:  COVID 1/1 >> positive HIV 1/1 >> neg MRSA PCR 1/5 >> positive BCx2 1/10 >>  Tracheal Aspirate 1/10 >> moderate yeast >>  Antimicrobials:  Cefepime 1/4 >> 1/8 Cefepime 1/10 >> Vanco 1/10 >> 1/11  Interim history/subjective:  Tmax 98.8 Glucose range 153-300 I/O - 1.1L UOP, 3.1L positive in last 24h RT reports bleeding from ETT PEEP 16, FiO2 80%, Peak 28, unable to assess plat due to dyssynchrony  Required PRN NMB x4 overnight Remains on bicarb gtt AFwRVR > started on bicarb overnight Bair hugger on  overnight Not on pressors   Objective   Blood pressure (!) 148/65, pulse 89, temperature 98.8 F (37.1 C), resp. rate (!) 28, height 5\' 10"  (1.778 m), weight 104.3 kg, SpO2 98 %.    Vent Mode: PRVC FiO2 (%):  [80 %-90 %] 80 % Set Rate:  [24 bmp] 24 bmp Vt Set:  [580 mL] 580 mL PEEP:  [16 cmH20] 16 cmH20 Plateau Pressure:  [34 cmH20-36 cmH20] 34 cmH20   Intake/Output Summary (Last 24 hours) at 05/20/2019 1040 Last data filed at 05/20/2019 1000 Gross per 24 hour  Intake 3607.48 ml  Output 1077 ml  Net 2530.48 ml   Filed Weights   05/15/19 1959 05/19/19 0410 05/20/19 0437  Weight: 98 kg 98.8 kg 104.3 kg    Examination: General: critically ill appearing adult male lying in bed on vent in NAD, prone HEENT: MM pink/moist, ETT with bloody secretions Neuro: sedate CV: s1s2 irr irr, AF 100's on monitor, no m/r/g PULM: dyssynchronous but non-labored, lungs bilaterally with crackles  GI: soft, bsx4 active  Extremities: warm/dry, trace to 1+ generalized edema  Skin: no rashes or lesions  Resolved Hospital Problem list     Assessment & Plan:   Acute Hypoxic Respiratory Failure secondary to COVID with ARDS  Hemoptysis  Left Aspiration PNA Questionable Pneumomediastinum  Completed remdesivir, tocilizumab. New bleeding from ETT 1/11, concern for possible aspiration 1/12 with worsening CXR findings. Possible pneumomediastinum on prone CXR 1/13.   -pt to supine 1/14, repeat CXR post -wife consented for bronchoscopy, she agrees with  procedure and indicates understanding.  Will call with findings post FOB -low Vt ventilation 4-8cc/kg -goal plateau pressure <30, driving pressure <91 cm H2O -target PaO2 55-65, titrate PEEP/FiO2 per ARDS protocol  -P/F ratio <150, prone therapy for 16 hours per day -may need continuous NMB -goal CVP <4, diuresis as necessary -VAP prevention measures  -follow intermittent CXR  -decadron per protocol   Need for Sedation due to Mechanical Ventilation   -PAD protocol with versed, fentanyl -continue PT oxycodone  -increase clonazepam to 2mg  BID  -RASS Goal -3  AFwRVR  Chronic AF Changed from lovenox to heparin gtt due to AKI.  Bleeding while on heparin.  RVR overnight 1/13.  -hold heparin/anticoagulation with bleeding  -continue amiodarone gtt -hold home propranolol, xarelto   AKI  Hyperkalemia Metabolic Acidosis  -continue bicarb gtt, reduce rate to 14ml/hr  -kayexalate 30g per tube x1  -follow BMP  -Trend BMP / urinary output -Replace electrolytes as indicated -Avoid nephrotoxic agents, ensure adequate renal perfusion  Thrombocytopenia  -no indication for transfusion at this time  -hold all anticoagulation  -concern for possible uremic bleeding, will give DDAVP x1  Leukocytosis  Hypothermia  -follow cultures  -abx as above  -note moderate yeast in tracheal aspirate, await cultures / may need antifungal coverage  Constipation  -kayexalate as above  -continue daily miralax  Hyperglycemia  -SSI, moderate scale  -increase levemir to 14 units BID  HTN HLD -hold home antihypertensives -continue lipitor  Best practice:  Diet: NPO; TF Pain/Anxiety/Delirium protocol (if indicated): in place VAP protocol (if indicated): in place DVT prophylaxis: lovenox GI prophylaxis: Pepcid/PPI, HOB >30 Glucose control: SSI as needed Mobility: BR Code Status: full code  Family Communication: Wife updated 1/13 in detail.  Reviewed need for bronchoscopy, risks/benefits and she consents.  Also reviewed concern for multi-organ disturbances and concept of CPR.  She would like to think about it.  Will follow up.  Disposition: ICU  Labs   CBC: Recent Labs  Lab 05/16/19 0648 05/17/19 0600 05/18/19 0325 05/18/19 1517 05/18/19 1651 05/19/19 0509 05/19/19 1440 05/20/19 0059 05/20/19 0445  WBC 17.7* 25.0* 16.8* 15.0*  --  19.4* 14.0*  --  16.5*  NEUTROABS 11.5* 15.1* 10.6*  --   --  13.3*  --   --  11.5*  HGB 14.8 14.5 12.6*  13.2 13.3 12.6* 11.3* 11.6* 12.1*  HCT 44.9 46.1 41.0 42.3 39.0 41.0 36.6* 34.0* 39.2  MCV 94.5 97.7 99.0 99.1  --  101.0* 99.5  --  99.7  PLT 95* 96* 66* 68*  --  60* 54*  --  51*    Basic Metabolic Panel: Recent Labs  Lab 05/14/19 0200 05/16/19 1824 05/17/19 0600 05/17/19 0600 05/17/19 1700 05/18/19 0325 05/19/19 0509 05/19/19 1440 05/19/19 2110 05/20/19 0059 05/20/19 0445  NA 137  --  137   < >  --  141 141 143 143 143 138  K 4.1  --  5.3*   < >  --  5.1 6.0* 5.9* 5.5* 5.3* 5.1  CL 108  --  104  --   --  108 108 110 109  --  105  CO2 18*  --  21*  --   --  22 23 24 24   --  25  GLUCOSE 51*  --  189*  --   --  164* 296* 189* 154*  --  160*  BUN 26*  --  83*  --   --  97* 126* 125* 140*  --  139*  CREATININE 0.76  --  1.96*  --   --  1.83* 2.38* 2.45* 2.56*  --  2.53*  CALCIUM 8.3*  --  7.7*  --   --  7.4* 7.5* 7.6* 7.5*  --  7.4*  MG 2.1 2.7* 2.7*  --  2.7* 2.9*  --   --   --   --   --   PHOS 4.1 7.9* 7.5*  --  6.9* 6.6*  --   --   --   --   --    < > = values in this interval not displayed.   GFR: Estimated Creatinine Clearance: 34.7 mL/min (A) (by C-G formula based on SCr of 2.53 mg/dL (H)). Recent Labs  Lab 05/18/19 1517 05/19/19 0509 05/19/19 1440 05/20/19 0445  WBC 15.0* 19.4* 14.0* 16.5*    Liver Function Tests: Recent Labs  Lab 05/15/19 0105 05/16/19 0648 05/17/19 0600 05/18/19 0325 05/19/19 0509  AST 69* 52* 35 33 40  ALT 105* 79* 62* 42 37  ALKPHOS 195* 218* 200* 155* 161*  BILITOT 1.4* 1.3* 1.2 0.8 0.7  PROT 6.1* 5.5* 6.0* 4.9* 4.6*  ALBUMIN 3.3* 3.1* 3.4* 2.7* 2.4*   No results for input(s): LIPASE, AMYLASE in the last 168 hours. No results for input(s): AMMONIA in the last 168 hours.  ABG    Component Value Date/Time   PHART 7.283 (L) 05/20/2019 0059   PCO2ART 55.8 (H) 05/20/2019 0059   PO2ART 72.0 (L) 05/20/2019 0059   HCO3 26.6 05/20/2019 0059   TCO2 28 05/20/2019 0059   ACIDBASEDEF 1.0 05/20/2019 0059   O2SAT 93.0 05/20/2019 0059      Coagulation Profile: No results for input(s): INR, PROTIME in the last 168 hours.  Cardiac Enzymes: No results for input(s): CKTOTAL, CKMB, CKMBINDEX, TROPONINI in the last 168 hours.  HbA1C: Hgb A1c MFr Bld  Date/Time Value Ref Range Status  05/09/2019 02:17 AM 8.5 (H) 4.8 - 5.6 % Final    Comment:    (NOTE) Pre diabetes:          5.7%-6.4% Diabetes:              >6.4% Glycemic control for   <7.0% adults with diabetes   10/06/2015 03:47 PM 6.5 (H) 4.8 - 5.6 % Final    Comment:             Pre-diabetes: 5.7 - 6.4          Diabetes: >6.4          Glycemic control for adults with diabetes: <7.0     CBG: Recent Labs  Lab 05/19/19 1617 05/19/19 1952 05/19/19 2306 05/20/19 0320 05/20/19 0801  GLUCAP 184* 165* 158* 160* 153*     Critical care time: 35 minutes   Canary Brim, MSN, NP-C [facility] 05/20/2019, 10:40 AM   Please see [email or web address] for pager details.

## 2019-05-20 NOTE — Progress Notes (Signed)
Pt turned supine at this time with no complications (early for bronch to be performed). Ett retaped at this time in proper position and secured.

## 2019-05-21 ENCOUNTER — Inpatient Hospital Stay (HOSPITAL_COMMUNITY): Payer: Private Health Insurance - Indemnity

## 2019-05-21 ENCOUNTER — Encounter (HOSPITAL_COMMUNITY): Payer: Self-pay | Admitting: Internal Medicine

## 2019-05-21 DIAGNOSIS — I482 Chronic atrial fibrillation, unspecified: Secondary | ICD-10-CM

## 2019-05-21 DIAGNOSIS — R140 Abdominal distension (gaseous): Secondary | ICD-10-CM

## 2019-05-21 DIAGNOSIS — J9601 Acute respiratory failure with hypoxia: Secondary | ICD-10-CM

## 2019-05-21 LAB — URINALYSIS, ROUTINE W REFLEX MICROSCOPIC
Bilirubin Urine: NEGATIVE
Glucose, UA: NEGATIVE mg/dL
Ketones, ur: 5 mg/dL — AB
Nitrite: NEGATIVE
Protein, ur: 100 mg/dL — AB
RBC / HPF: >50 RBC/hpf — ABNORMAL HIGH (ref 0–5)
Specific Gravity, Urine: 1.019 (ref 1.005–1.030)
WBC, UA: >50 WBC/hpf — ABNORMAL HIGH (ref 0–5)
pH: 5 (ref 5.0–8.0)

## 2019-05-21 LAB — POCT I-STAT 7, (LYTES, BLD GAS, ICA,H+H)
Acid-Base Excess: 1 mmol/L (ref 0.0–2.0)
Acid-Base Excess: 5 mmol/L — ABNORMAL HIGH (ref 0.0–2.0)
Bicarbonate: 28.2 mmol/L — ABNORMAL HIGH (ref 20.0–28.0)
Bicarbonate: 29.3 mmol/L — ABNORMAL HIGH (ref 20.0–28.0)
Bicarbonate: 33.2 mmol/L — ABNORMAL HIGH (ref 20.0–28.0)
Calcium, Ion: 1.13 mmol/L — ABNORMAL LOW (ref 1.15–1.40)
Calcium, Ion: 1.13 mmol/L — ABNORMAL LOW (ref 1.15–1.40)
Calcium, Ion: 1.11 mmol/L — ABNORMAL LOW (ref 1.15–1.40)
HCT: 30 % — ABNORMAL LOW (ref 39.0–52.0)
HCT: 33 % — ABNORMAL LOW (ref 39.0–52.0)
HCT: 30 % — ABNORMAL LOW (ref 39.0–52.0)
Hemoglobin: 10.2 g/dL — ABNORMAL LOW (ref 13.0–17.0)
Hemoglobin: 11.2 g/dL — ABNORMAL LOW (ref 13.0–17.0)
Hemoglobin: 10.2 g/dL — ABNORMAL LOW (ref 13.0–17.0)
O2 Saturation: 95 %
O2 Saturation: 56 %
O2 Saturation: 78 %
Patient temperature: 36.9
Patient temperature: 35.7
Patient temperature: 35.6
Potassium: 5.2 mmol/L — ABNORMAL HIGH (ref 3.5–5.1)
Potassium: 4.8 mmol/L (ref 3.5–5.1)
Potassium: 4.4 mmol/L (ref 3.5–5.1)
Sodium: 140 mmol/L (ref 135–145)
Sodium: 142 mmol/L (ref 135–145)
Sodium: 143 mmol/L (ref 135–145)
TCO2: 30 mmol/L (ref 22–32)
TCO2: 31 mmol/L (ref 22–32)
TCO2: 35 mmol/L — ABNORMAL HIGH (ref 22–32)
pCO2 arterial: 59.1 mmHg — ABNORMAL HIGH (ref 32.0–48.0)
pCO2 arterial: 65.6 mmHg (ref 32.0–48.0)
pCO2 arterial: 65 mmHg — ABNORMAL HIGH (ref 32.0–48.0)
pH, Arterial: 7.286 — ABNORMAL LOW (ref 7.350–7.450)
pH, Arterial: 7.251 — ABNORMAL LOW (ref 7.350–7.450)
pH, Arterial: 7.309 — ABNORMAL LOW (ref 7.350–7.450)
pO2, Arterial: 89 mmHg (ref 83.0–108.0)
pO2, Arterial: 33 mmHg — CL (ref 83.0–108.0)
pO2, Arterial: 45 mmHg — ABNORMAL LOW (ref 83.0–108.0)

## 2019-05-21 LAB — CBC WITH DIFFERENTIAL/PLATELET
Abs Immature Granulocytes: 0.12 10*3/uL — ABNORMAL HIGH (ref 0.00–0.07)
Basophils Absolute: 0 10*3/uL (ref 0.0–0.1)
Basophils Relative: 0 %
Eosinophils Absolute: 0.1 10*3/uL (ref 0.0–0.5)
Eosinophils Relative: 1 %
HCT: 32.9 % — ABNORMAL LOW (ref 39.0–52.0)
Hemoglobin: 10 g/dL — ABNORMAL LOW (ref 13.0–17.0)
Immature Granulocytes: 1 %
Lymphocytes Relative: 25 %
Lymphs Abs: 3.7 10*3/uL (ref 0.7–4.0)
MCH: 30.6 pg (ref 26.0–34.0)
MCHC: 30.4 g/dL (ref 30.0–36.0)
MCV: 100.6 fL — ABNORMAL HIGH (ref 80.0–100.0)
Monocytes Absolute: 0.6 10*3/uL (ref 0.1–1.0)
Monocytes Relative: 4 %
Neutro Abs: 10.4 10*3/uL — ABNORMAL HIGH (ref 1.7–7.7)
Neutrophils Relative %: 69 %
Platelets: 36 10*3/uL — ABNORMAL LOW (ref 150–400)
RBC: 3.27 MIL/uL — ABNORMAL LOW (ref 4.22–5.81)
RDW: 15.7 % — ABNORMAL HIGH (ref 11.5–15.5)
WBC: 14.9 10*3/uL — ABNORMAL HIGH (ref 4.0–10.5)
nRBC: 0 % (ref 0.0–0.2)

## 2019-05-21 LAB — D-DIMER, QUANTITATIVE: D-Dimer, Quant: 11.47 ug/mL-FEU — ABNORMAL HIGH (ref 0.00–0.50)

## 2019-05-21 LAB — LACTATE DEHYDROGENASE: LDH: 732 U/L — ABNORMAL HIGH (ref 98–192)

## 2019-05-21 LAB — HEPARIN LEVEL (UNFRACTIONATED): Heparin Unfractionated: 0.17 IU/mL — ABNORMAL LOW (ref 0.30–0.70)

## 2019-05-21 LAB — BASIC METABOLIC PANEL
Anion gap: 10 (ref 5–15)
BUN: 162 mg/dL — ABNORMAL HIGH (ref 8–23)
CO2: 28 mmol/L (ref 22–32)
Calcium: 7.3 mg/dL — ABNORMAL LOW (ref 8.9–10.3)
Chloride: 104 mmol/L (ref 98–111)
Creatinine, Ser: 3.68 mg/dL — ABNORMAL HIGH (ref 0.61–1.24)
GFR calc Af Amer: 19 mL/min — ABNORMAL LOW (ref >60)
GFR calc non Af Amer: 16 mL/min — ABNORMAL LOW (ref >60)
Glucose, Bld: 370 mg/dL — ABNORMAL HIGH (ref 70–99)
Potassium: 5.3 mmol/L — ABNORMAL HIGH (ref 3.5–5.1)
Sodium: 142 mmol/L (ref 135–145)

## 2019-05-21 LAB — DIC (DISSEMINATED INTRAVASCULAR COAGULATION)PANEL
D-Dimer, Quant: 11.06 ug/mL-FEU — ABNORMAL HIGH (ref 0.00–0.50)
Fibrinogen: 324 mg/dL (ref 210–475)
INR: 1.2 (ref 0.8–1.2)
Platelets: 33 10*3/uL — ABNORMAL LOW (ref 150–400)
Prothrombin Time: 15.2 seconds (ref 11.4–15.2)
Smear Review: NONE SEEN
aPTT: 30 seconds (ref 24–36)

## 2019-05-21 LAB — GLUCOSE, CAPILLARY
Glucose-Capillary: 346 mg/dL — ABNORMAL HIGH (ref 70–99)
Glucose-Capillary: 357 mg/dL — ABNORMAL HIGH (ref 70–99)
Glucose-Capillary: 347 mg/dL — ABNORMAL HIGH (ref 70–99)
Glucose-Capillary: 317 mg/dL — ABNORMAL HIGH (ref 70–99)
Glucose-Capillary: 283 mg/dL — ABNORMAL HIGH (ref 70–99)
Glucose-Capillary: 246 mg/dL — ABNORMAL HIGH (ref 70–99)

## 2019-05-21 LAB — CULTURE, RESPIRATORY W GRAM STAIN

## 2019-05-21 MED ORDER — SODIUM CHLORIDE 0.9 % IV SOLN
2.0000 g | Freq: Two times a day (BID) | INTRAVENOUS | Status: DC
  Administered 2019-05-22 (×2): 2 g via INTRAVENOUS
  Filled 2019-05-21 (×3): qty 2

## 2019-05-21 MED ORDER — MIDAZOLAM BOLUS VIA INFUSION
1.0000 mg | INTRAVENOUS | Status: DC | PRN
  Filled 2019-05-21: qty 2

## 2019-05-21 MED ORDER — SODIUM POLYSTYRENE SULFONATE 15 GM/60ML PO SUSP
30.0000 g | Freq: Once | ORAL | Status: AC
  Administered 2019-05-21: 30 g
  Filled 2019-05-21: qty 120

## 2019-05-21 MED ORDER — PRISMASOL BGK 4/2.5 32-4-2.5 MEQ/L REPLACEMENT SOLN: Status: DC

## 2019-05-21 MED ORDER — SODIUM CHLORIDE 0.9 % IV SOLN: 2.0000 g | INTRAVENOUS | Status: DC

## 2019-05-21 MED ORDER — HEPARIN SODIUM (PORCINE) 1000 UNIT/ML DIALYSIS
1000.0000 [IU] | INTRAMUSCULAR | Status: DC | PRN
  Administered 2019-05-21: 18:00:00 1000 [IU] via INTRAVENOUS_CENTRAL
  Administered 2019-05-22: 09:00:00 2800 [IU] via INTRAVENOUS_CENTRAL
  Administered 2019-05-22 (×2): 1400 [IU] via INTRAVENOUS_CENTRAL
  Filled 2019-05-21 (×3): qty 6
  Filled 2019-05-21: qty 1
  Filled 2019-05-21: qty 4
  Filled 2019-05-21 (×2): qty 6

## 2019-05-21 MED ORDER — SODIUM BICARBONATE 8.4 % IV SOLN
INTRAVENOUS | Status: AC
  Filled 2019-05-21: qty 50

## 2019-05-21 MED ORDER — FUROSEMIDE 10 MG/ML IJ SOLN
80.0000 mg | Freq: Once | INTRAMUSCULAR | Status: AC
  Administered 2019-05-21: 15:00:00 80 mg via INTRAVENOUS
  Filled 2019-05-21: qty 8

## 2019-05-21 MED ORDER — ALTEPLASE 2 MG IJ SOLR
2.0000 mg | Freq: Once | INTRAMUSCULAR | Status: DC | PRN
  Filled 2019-05-21: qty 2

## 2019-05-21 MED ORDER — INSULIN DETEMIR 100 UNIT/ML ~~LOC~~ SOLN
18.0000 [IU] | Freq: Two times a day (BID) | SUBCUTANEOUS | Status: DC
  Administered 2019-05-21 – 2019-05-22 (×2): 18 [IU] via SUBCUTANEOUS
  Filled 2019-05-21 (×3): qty 0.18

## 2019-05-21 MED ORDER — HYDROMORPHONE BOLUS VIA INFUSION
0.2000 mg | INTRAVENOUS | Status: DC | PRN
  Filled 2019-05-21: qty 1

## 2019-05-21 MED ORDER — AMIODARONE IV BOLUS ONLY 150 MG/100ML
150.0000 mg | Freq: Once | INTRAVENOUS | Status: AC
  Administered 2019-05-21: 15:00:00 150 mg via INTRAVENOUS
  Filled 2019-05-21: qty 100

## 2019-05-21 MED ORDER — SODIUM CHLORIDE 0.9% IV SOLUTION: Freq: Once | INTRAVENOUS | Status: AC

## 2019-05-21 MED ORDER — SODIUM CHLORIDE 0.9 % FOR CRRT
INTRAVENOUS_CENTRAL | Status: DC | PRN
  Filled 2019-05-21 (×2): qty 1000

## 2019-05-21 MED ORDER — SODIUM CHLORIDE 0.9 % IV SOLN
0.5000 mg/h | INTRAVENOUS | Status: DC
  Administered 2019-05-22: 03:00:00 4 mg/h via INTRAVENOUS
  Filled 2019-05-21 (×3): qty 5

## 2019-05-21 MED ORDER — PRISMASOL BGK 4/2.5 32-4-2.5 MEQ/L IV SOLN: INTRAVENOUS | Status: DC

## 2019-05-21 MED ORDER — CISATRACURIUM BOLUS VIA INFUSION
10.0000 mg | Freq: Once | INTRAVENOUS | Status: AC
  Administered 2019-05-21: 10 mg via INTRAVENOUS
  Filled 2019-05-21: qty 10

## 2019-05-21 MED ORDER — SODIUM CHLORIDE 0.9 % IV SOLN
0.5000 mg/h | INTRAVENOUS | Status: DC
  Administered 2019-05-21: 14:00:00 2 mg/h via INTRAVENOUS
  Filled 2019-05-21: qty 5

## 2019-05-21 MED ORDER — SODIUM BICARBONATE 8.4 % IV SOLN
50.0000 meq | Freq: Once | INTRAVENOUS | Status: AC
  Administered 2019-05-21: 50 meq via INTRAVENOUS

## 2019-05-21 MED ORDER — INSULIN ASPART 100 UNIT/ML ~~LOC~~ SOLN
5.0000 [IU] | SUBCUTANEOUS | Status: DC
  Administered 2019-05-21 – 2019-05-22 (×6): 5 [IU] via SUBCUTANEOUS

## 2019-05-21 MED ORDER — MIDAZOLAM 50MG/50ML (1MG/ML) PREMIX INFUSION
2.0000 mg/h | INTRAVENOUS | Status: DC
  Administered 2019-05-21: 18:00:00 9 mg/h via INTRAVENOUS
  Administered 2019-05-22 (×3): 10 mg/h via INTRAVENOUS
  Filled 2019-05-21 (×2): qty 50

## 2019-05-21 MED ORDER — SODIUM CHLORIDE 0.9 % IV SOLN
0.0000 ug/kg/min | INTRAVENOUS | Status: DC
  Administered 2019-05-21 – 2019-05-22 (×2): 3 ug/kg/min via INTRAVENOUS
  Filled 2019-05-21 (×3): qty 20

## 2019-05-21 MED ORDER — INSULIN ASPART 100 UNIT/ML ~~LOC~~ SOLN
0.0000 [IU] | SUBCUTANEOUS | Status: DC
  Administered 2019-05-21: 23:00:00 7 [IU] via SUBCUTANEOUS
  Administered 2019-05-21: 11 [IU] via SUBCUTANEOUS
  Administered 2019-05-21 (×2): 15 [IU] via SUBCUTANEOUS
  Administered 2019-05-22 (×2): 4 [IU] via SUBCUTANEOUS

## 2019-05-21 NOTE — Procedures (Signed)
Hemodialysis Catheter Insertion Procedure Note Max Lozano [id number] [birth year]  Procedure: Insertion of Central Venous Catheter Indications: Hemodialysis  Procedure Details Consent: Risks of procedure as well as the alternatives and risks of each were explained to the (patient/caregiver).  Consent for procedure obtained. Time Out: Verified patient identification, verified procedure, site/side was marked, verified correct patient position, special equipment/implants available, medications/allergies/relevent history reviewed, required imaging and test results available.  Performed  Maximum sterile technique was used including antiseptics, cap, gloves, gown, hand hygiene, mask and sheet. Skin prep: Chlorhexidine; local anesthetic administered A antimicrobial bonded/coated triple lumen catheter was placed in the left internal jugular vein using the Seldinger technique.  Ultrasound was used to verify the patency of the vein and for real time needle guidance.  Evaluation Blood flow good Complications: No apparent complications Patient did tolerate procedure well. Chest X-ray ordered to verify placement.  CXR: pending.  Max Fickle 05/21/2019, 6:43 PM

## 2019-05-21 NOTE — Progress Notes (Signed)
[facility] PCCM  Persistent ventilator dyssynchrony, requiring bolus dosing of neuromuscular blockade, peak pressures elevated persistently.  Will add continuous infusion of neuromuscular blockade.  Heber Midway, MD [facility] Pager: [phone] Cell: [phone] If no response, call [phone]

## 2019-05-21 NOTE — Consult Note (Signed)
Renal Service Consult Note [facility]  Max Lozano 05/21/2019 [place]azing Requesting Physician:  Dr Lake Bells  Reason for Consult: AKI in covid ICU patient HPI: The patient is a 69 y.o. year-old w/ hx of atrial fib, HTN and PPM admitted for COVID +pna / hypoxemic resp failure on 06/01/2019. Pt worsened and was intubated on 1/09. Now is having worsening met acidosis and renal failure and low plts. Asked to see for AKI.    Admit creat 0.90, aki starting 1/10 after intubated on 1/09. Creat rising up to 3.68 today w/ BUN 162.    Abs/ antivirals-  > remdesivir 12/31- 1/04  > cefepime 1/4- current  > vancomycin IV 1/04 - current  > actemra 1/02  Spiking fevers last 3-4 days BP's are okay, got pressors on 1/11, none now IV DDAVP given yesterday for bleeding ETT ABG today 7.28/ 59/ 89     Na 142 K 5.3 CO2 28  BUN 162  Cr 3.68  Ca 7.3 alb 2.4     WBC 14k Hb 10.0  plt 36k (declining since 1/07)  Smear review on 1/12 showed no schistocytes   I/O since admit = 30L in and 15L out, +15 L   Wt's 98kg admit >> 108kg today, + 10kg   CXR 1/14 > . Stable multifocal lung opacities are noted consistent with multifocal pneumonia   UOP around 1000 / day , dropped off 1/13 at 849 and today 225 cc so far  ROS   n/a   Past Medical History  Past Medical History:  Diagnosis Date  . Atrial fibrillation ([place])   . Hyperlipidemia   . Hypertension   . Motion sickness    deep sea boats  . PPM-[employer] 10/11/2009   Qualifier: Diagnosis of  By: Lovena Le, MD, Martyn Malay   . Sick sinus syndrome [facility])    s/p pacer  . Syncope and collapse   . Wears dentures    full upper and lower   Past Surgical History  Past Surgical History:  Procedure Laterality Date  . COLONOSCOPY WITH PROPOFOL N/A 03/25/2015   Procedure: COLONOSCOPY WITH PROPOFOL;  Surgeon: Lucilla Lame, MD;  Location: [place];  Service: Endoscopy;  Laterality: N/A;  ULCER ASCENDING COLON BX.   . INSERT /  REPLACE / REMOVE PACEMAKER  09/04/07   [employer]   Family History  Family History  Problem Relation Age of Onset  . Diabetes Mother   . Heart attack Father    Social History  reports that he has never smoked. He has never used smokeless tobacco. He reports that he does not drink alcohol or use drugs. Allergies  Allergies  Allergen Reactions  . Atenolol Rash   Home medications Prior to Admission medications   Medication Sig Start Date End Date Taking? Authorizing Provider  atorvastatin (LIPITOR) 10 MG tablet TAKE 1 TABLET(10 MG) BY MOUTH DAILY 05/08/18  Yes Gollan, Kathlene November, MD  diltiazem (CARDIZEM CD) 240 MG 24 hr capsule Take 1 capsule (240 mg total) by mouth daily. 01/01/19  Yes Gollan, Kathlene November, MD  fish oil-omega-3 fatty acids 1000 MG capsule Take 1 g by mouth 2 (two) times daily.     Yes [provider]  propranolol ER (INDERAL LA) 80 MG 24 hr capsule Take 1 capsule (80 mg total) by mouth daily. 01/01/19  Yes Gollan, Kathlene November, MD  XARELTO 20 MG TABS tablet TAKE 1 TABLET BY MOUTH ONCE DAILY WITH SUPPER 12/23/18  Yes Ida Rogue  J, MD  lisinopril (ZESTRIL) 20 MG tablet Take 1 tablet (20 mg total) by mouth daily. 12/22/18 03/22/19  Minna Merritts, MD   Liver Function Tests Recent Labs  Lab 05/17/19 0600 05/18/19 0325 05/19/19 0509  AST 35 33 40  ALT 62* 42 37  ALKPHOS 200* 155* 161*  BILITOT 1.2 0.8 0.7  PROT 6.0* 4.9* 4.6*  ALBUMIN 3.4* 2.7* 2.4*   No results for input(s): LIPASE, AMYLASE in the last 168 hours. CBC Recent Labs  Lab 05/19/19 0509 05/19/19 0509 05/19/19 1440 05/20/19 0059 05/20/19 0445 05/20/19 0445 05/20/19 2019 05/21/19 0154 05/21/19 0440  WBC 19.4*   < > 14.0*  --  16.5*  --   --   --  14.9*  NEUTROABS 13.3*  --   --   --  11.5*  --   --   --  10.4*  HGB 12.6*   < > 11.3*   < > 12.1*   < > 10.9* 10.2* 10.0*  HCT 41.0   < > 36.6*   < > 39.2   < > 32.0* 30.0* 32.9*  MCV 101.0*   < > 99.5  --  99.7  --   --   --  100.6*  PLT 60*    < > 54*  --  51*  --   --   --  36*   < > = values in this interval not displayed.   Basic Metabolic Panel Recent Labs  Lab 05/16/19 1824 05/16/19 2248 05/17/19 0600 05/17/19 1207 05/17/19 1700 05/18/19 0325 05/18/19 1651 05/19/19 0509 05/19/19 0509 05/19/19 1440 05/19/19 2110 05/20/19 0059 05/20/19 0445 05/20/19 2019 05/21/19 0154 05/21/19 0440  NA  --    < > 137   < >  --  141   < > 141   < > 143 143 143 138 142 140 142  K  --    < > 5.3*   < >  --  5.1   < > 6.0*   < > 5.9* 5.5* 5.3* 5.1 5.5* 5.2* 5.3*  CL  --   --  104  --   --  108  --  108  --  110 109  --  105  --   --  104  CO2  --   --  21*  --   --  22  --  23  --  24 24  --  25  --   --  28  GLUCOSE  --   --  189*  --   --  164*  --  296*  --  189* 154*  --  160*  --   --  370*  BUN  --   --  83*  --   --  97*  --  126*  --  125* 140*  --  139*  --   --  162*  CREATININE  --   --  1.96*  --   --  1.83*  --  2.38*  --  2.45* 2.56*  --  2.53*  --   --  3.68*  CALCIUM  --   --  7.7*  --   --  7.4*  --  7.5*  --  7.6* 7.5*  --  7.4*  --   --  7.3*  PHOS 7.9*  --  7.5*  --  6.9* 6.6*  --   --   --   --   --   --   --   --   --   --    < > =  values in this interval not displayed.   Iron/TIBC/Ferritin/ %Sat    Component Value Date/Time   FERRITIN 774 (H) 05/17/2019 0600    Vitals:   05/21/19 1100 05/21/19 1118 05/21/19 1153 05/21/19 1200  BP: (!) 157/71   (!) 148/90  Pulse: 85 (!) 137 100 (!) 143  Resp: (!) 30 (!) 33 (!) 30 (!) 30  Temp: (!) 95.9 F (35.5 C) (!) 96.1 F (35.6 C) (!) 96.1 F (35.6 C) (!) 96.1 F (35.6 C)  TempSrc:      SpO2: 93% (!) 86% 92% 90%  Weight:      Height:        Exam Gen on vent, sedated No rash, cyanosis or gangrene Sclera anicteric, throat w ETT  No jvd or bruits Chest bilat rhonchi, no wheezing RRR no MRG Abd soft ntnd no mass or ascites +bs GU normal male w/ foley draining clear amber urine, small amts MS no joint effusions or deformity Ext diffuse 2+ edema of all  ext, no wounds or ulcers Neuro is sedated on the vent    Home meds:  - diltiazem 240 qd/ propranolol la 80 qd  - xarelto 20 qd  - atorvastatin 40 qd    Una < 10 on 1/10 ABG today 7.28/ 59/ 89     Na 142 K 5.3 CO2 28  BUN 162  Cr 3.68  Ca 7.3 alb 2.4     WBC 14k Hb 10.0  plt 36k (declining since 1/07)  Smear review on 1/12 showed no schistocytes   I/O since admit = 30L in and 15L out, +15 L   Wt's 98kg admit >> 108kg today, + 10kg   CXR 1/14 > Stable multifocal lung opacities are noted consistent with multifocal pneumonia   UOP 1000/ d but down 1/13 yest at 849 and today only 225cc   UA pending     100 cc contrast on 05/11/19 w/ CT angio of chest    Assessment/ Plan: 1. AKI - severe, admit creat 0.9 on 1/01. Suspect ATN, need UA and renal US.  Transiently was in shock.  Low plts raise concern for TTP, however no schistocytes per smear review on 1/12. Will get haptoglobin. Pt is now oliguric w/ rising creat Donah Driver and will need CRRT, have d/w primary team.   2. COVID+ PNA/ ARDS - high FiO2, severe CXR changes 3. Vol overload - hopefully can pull some fluid w/ CRRT 4. BP - no pressors at this time 5. Thrombocytopenia - w/ bleeding, as above      Kelly Splinter  MD 05/21/2019, 12:26 PM

## 2019-05-21 NOTE — Progress Notes (Signed)
RT NOTE  Patient turned head to the RIGHT.Tube secured at 24. Patient tolerated well.

## 2019-05-21 NOTE — Progress Notes (Signed)
Patient placed in Supine position by RT x 2 and RN x 3.  Patient desated to low 70's. Which is slowly improving. ETT suctioned with no secretions returned.  ETT remains secured at 24 cm.

## 2019-05-21 NOTE — Progress Notes (Signed)
PT Cancellation Note  Patient Details Name: Max Lozano MRN: [id number] DOB: 1950-05-15   Cancelled Treatment:    Reason Eval/Treat Not Completed: Medical issues which prohibited therapy, on vent,sedated.    Rada Hay 05/21/2019, 7:39 AM  Blanchard Kelch PT Acute Rehabilitation Services Pager [phone] Office [phone]

## 2019-05-21 NOTE — Progress Notes (Signed)
RT NOTE  Patient turned head to the LEFT.Tube secured at 24. Patient tolerated well.

## 2019-05-21 NOTE — Progress Notes (Addendum)
Called to bedside to assess patient with saturations in the 60's.  Patient turned to supine around noon.  Desaturation slowly since that time.  Noted AF with rates into the 140's.  ABG assessed > 7.251 / 65.6 / 33 / 29.3.  Assessed patient for bilateral breath sounds, ? Small pleural rub on left.  Patient repositioned to reverse trendelenburg as abdomen appears more protuberant than yesterday's exam.  Saturations improved to the mid 80's with interventions.   Plan:  PEEP increased to 18 cm H20 Now 1 am sodium bicarbonate  Now 150mg  bolus of amiodarone  Reposition patient, obtain KUB.  Consider dose reglan pending review Now PRN dose of paralytic Lasix 80mg  IV x1 Hold prone positioning for the evening, reassess in am   Wife called and updated on status.   Additional CC Time: 30 minutes  , MSN, NP-C [facility] 05/21/2019, 3:05 PM   Please see [email or web address] for pager details.

## 2019-05-21 NOTE — Progress Notes (Addendum)
NAME:  Max Lozano, MRN:  [id number], DOB:  [birth year], LOS: 81 ADMISSION DATE:  05/10/2019, CONSULTATION DATE:  05/16/2019 REFERRING MD: Dr. Dante Gang, CHIEF COMPLAINT:  Hypoxia/respiratory failure   Brief History   69 year old gentleman with history of chronic AF on Xarelto, sick sinus syndrome status post pacemaker, syncope and hypertension presented to the ER 1/1 with 4 days of nonproductive cough and fever on 12/31. His wife was symptomatic, tested and was COVID positive on December 20, but he tested negative at that time.   Initially saturating 80% on room air, required 6 L of nasal cannula oxygen. Developed worsening O2 needs 1/7 and was transitioned to 50L HFNC oxygen.  CXR also noted to have worsening infiltrates at that time.  Further declined in the early hours of 05/16/2019 and the decision was made to intubate him and transfer him to ICU care.  Past Medical History  HTN AFib Sick sinus sydrome (pacemaker placed)  Significant Hospital Events   1/01 Admit  1/07 Worsening infiltrates, O2 needs 1/09 Tx to ICU, intubated 1/12 Worsening metabolic acidosis, AKI, concern for worsening infiltrate on L due to bleeding  1/13 CXR worse bilaterally, bleeding from ETT.  DDAVP. FOB for hemoptysis. AKI  Consults:  PCCM  Procedures:  ETT 1/9 >> TLC 1/10 >>  Significant Diagnostic Tests:  COVID + 12/31 CXR with diffuse bilateral opacities consistent with dx  Micro Data:  COVID 1/1 >> positive HIV 1/1 >> neg MRSA PCR 1/5 >> positive BCx2 1/10 >>  Tracheal Aspirate 1/10 >> moderate yeast >> BAL 1/13 >>   Antimicrobials:  Cefepime 1/4 >> 1/8 Cefepime 1/10 >> Vanco 1/10 >> 1/11  Interim history/subjective:  Tmax 96.6  PEEP 16 / FiO2 80%, Peak 40, Pplat 35.   Remains on amiodarone gtt, rate improved Bleeding from ETT improved but ongoing I/O - 849 ml UOP, 2.8L+ in 24h Glucose range - 160-370 On bicarb gtt Not on pressors  Objective   Blood pressure (!) 145/61, pulse 80,  temperature (!) 96.4 F (35.8 C), resp. rate (!) 30, height _0  (1.778 m), weight 108.5 kg, SpO2 96 %.    Vent Mode: PRVC FiO2 (%):  [80 %-100 %] 80 % Set Rate:  [24 bmp-30 bmp] 30 bmp Vt Set:  [580 mL] 580 mL PEEP:  [16 cmH20] 16 cmH20 Plateau Pressure:  [26 cmH20-38 cmH20] 35 cmH20   Intake/Output Summary (Last 24 hours) at 05/21/2019 1140 Last data filed at 05/21/2019 1000 Gross per 24 hour  Intake 3561.03 ml  Output 790 ml  Net 2771.03 ml   Filed Weights   05/19/19 0410 05/20/19 0437 05/21/19 0421  Weight: 98.8 kg 104.3 kg 108.5 kg    Examination: General: Critically ill-appearing adult male lying in bed on vent in no acute distress HEENT: MM pink/moist, ETT, thin bloody secretions in ETT (reduced volume) Neuro: Sedate, prone CV: s1s2 IRR IRR, no m/r/g PULM: Nonlabored on mechanical ventilation, coarse breath sounds bilaterally posterior GI: soft, bs active  Extremities: warm/dry, 1+ generalized edema  Skin: no rashes or lesions  CXR 1/14-images personally reviewed, diffuse bilateral opacities, ETT in good position  Resolved Hospital Problem list     Assessment & Plan:   Acute Hypoxic Respiratory Failure secondary to COVID with ARDS  Hemoptysis  Left Aspiration PNA Completed remdesivir, tocilizumab. New bleeding from ETT 1/11, concern for possible aspiration 1/12 with worsening CXR findings.    -low Vt ventilation 4-8cc/kg -goal plateau pressure <30, driving pressure <10 cm H2O -target PaO2 55-65,  titrate PEEP/FiO2 per ARDS protocol  -P/F ratio <150, prone therapy for 16 hours per day -goal CVP <4, diuresis as necessary -VAP prevention measures  -follow intermittent CXR  -decadron per protocol -Continue empiric antibiotics as above,  D5/7  Need for Sedation due to Mechanical Ventilation  -PAD protocol with Versed, fentanyl -Continue clonazepam, oxycodone PT  -RASS goal -3 to -4  AFwRVR  Chronic AF Changed from lovenox to heparin gtt due to AKI.   Bleeding while on heparin.  RVR overnight 1/13.  -Anticoagulation on hold with bleeding -Continue amiodarone gtt -Hold home propranolol, Xarelto  AKI  Hyperkalemia Metabolic Acidosis  -Negative for schistocytes.  Send LDH, DIC, HIT, D-Dimer.  ? Atypical HUS.  Send ADAMS-13 pending review of 1/14 thrombocytopenia labs -Continue bicarbonate gtt at 50 ml/hr -Nephrology consulted, appreciate input  -kayexalte 30g per tube x1 -Trend BMP / urinary output -Replace electrolytes as indicated -Avoid nephrotoxic agents, ensure adequate renal perfusion  Thrombocytopenia  Peripheral smear negative 1/12, no schistocytes.  S/P DDAVP on 1/13.  -hold all anticoagulation  -no indication for transfusion at this time  Leukocytosis  Hypothermia  -follow cultures, note moderate yeast in tracheal aspirate from 1/10 -abx as above  Constipation  -continue miralax -repeat kayexalte as above  Hyperglycemia  -SSI, moderate scale  -increase levemir to 18 units BID -add 5 units TF coverage Q4  HTN HLD -continue lipitor -hold home antihypertensives   Best practice:  Diet: NPO; TF Pain/Anxiety/Delirium protocol (if indicated): in place VAP protocol (if indicated): in place DVT prophylaxis: lovenox GI prophylaxis: Pepcid/PPI, HOB >30 Glucose control: SSI as needed Mobility: BR Code Status: full code  Family Communication: Wife updated via phone 1/14 on plan of care.  Disposition: ICU  Labs   CBC: Recent Labs  Lab 05/17/19 0600 05/17/19 1207 05/18/19 0325 05/18/19 0325 05/18/19 1517 05/18/19 1651 05/19/19 0509 05/19/19 0509 05/19/19 1440 05/19/19 1440 05/20/19 0059 05/20/19 0445 05/20/19 2019 05/21/19 0154 05/21/19 0440  WBC 25.0*  --  16.8*   < > 15.0*  --  19.4*  --  14.0*  --   --  16.5*  --   --  14.9*  NEUTROABS 15.1*  --  10.6*  --   --   --  13.3*  --   --   --   --  11.5*  --   --  10.4*  HGB 14.5   < > 12.6*   < > 13.2   < > 12.6*   < > 11.3*   < > 11.6* 12.1* 10.9*  10.2* 10.0*  HCT 46.1   < > 41.0   < > 42.3   < > 41.0   < > 36.6*   < > 34.0* 39.2 32.0* 30.0* 32.9*  MCV 97.7  --  99.0   < > 99.1  --  101.0*  --  99.5  --   --  99.7  --   --  100.6*  PLT 96*  --  66*   < > 68*  --  60*  --  54*  --   --  51*  --   --  36*   < > = values in this interval not displayed.    Basic Metabolic Panel: Recent Labs  Lab 05/16/19 1824 05/16/19 2248 05/17/19 0600 05/17/19 0600 05/17/19 1207 05/17/19 1700 05/18/19 0325 05/18/19 1651 05/19/19 0509 05/19/19 0509 05/19/19 1440 05/19/19 1440 05/19/19 2110 05/19/19 2110 05/20/19 0059 05/20/19 0445 05/20/19 2019 05/21/19 0154 05/21/19 0440  NA  --    < >  137   < >   < >  --  141   < > 141   < > 143   < > 143   < > 143 138 142 140 142  K  --    < > 5.3*   < >   < >  --  5.1   < > 6.0*   < > 5.9*   < > 5.5*   < > 5.3* 5.1 5.5* 5.2* 5.3*  CL  --   --  104   < >  --   --  108  --  108  --  110  --  109  --   --  105  --   --  104  CO2  --   --  21*   < >  --   --  22  --  23  --  24  --  24  --   --  25  --   --  28  GLUCOSE  --   --  189*   < >  --   --  164*  --  296*  --  189*  --  154*  --   --  160*  --   --  370*  BUN  --   --  83*   < >  --   --  97*  --  126*  --  125*  --  140*  --   --  139*  --   --  162*  CREATININE  --   --  1.96*   < >  --   --  1.83*  --  2.38*  --  2.45*  --  2.56*  --   --  2.53*  --   --  3.68*  CALCIUM  --   --  7.7*   < >  --   --  7.4*  --  7.5*  --  7.6*  --  7.5*  --   --  7.4*  --   --  7.3*  MG 2.7*  --  2.7*  --   --  2.7* 2.9*  --   --   --   --   --   --   --   --   --   --   --   --   PHOS 7.9*  --  7.5*  --   --  6.9* 6.6*  --   --   --   --   --   --   --   --   --   --   --   --    < > = values in this interval not displayed.   GFR: Estimated Creatinine Clearance: 24.4 mL/min (A) (by C-G formula based on SCr of 3.68 mg/dL (H)). Recent Labs  Lab 05/19/19 0509 05/19/19 1440 05/20/19 0445 05/20/19 0500 05/21/19 0440  PROCALCITON  --   --   --  0.76  --     WBC 19.4* 14.0* 16.5*  --  14.9*    Liver Function Tests: Recent Labs  Lab 05/15/19 0105 05/16/19 0648 05/17/19 0600 05/18/19 0325 05/19/19 0509  AST 69* 52* 35 33 40  ALT 105* 79* 62* 42 37  ALKPHOS 195* 218* 200* 155* 161*  BILITOT 1.4* 1.3* 1.2 0.8 0.7  PROT 6.1* 5.5* 6.0* 4.9* 4.6*  ALBUMIN 3.3* 3.1* 3.4* 2.7* 2.4*   No results for input(s): LIPASE,  AMYLASE in the last 168 hours. No results for input(s): AMMONIA in the last 168 hours.  ABG    Component Value Date/Time   PHART 7.286 (L) 05/21/2019 0154   PCO2ART 59.1 (H) 05/21/2019 0154   PO2ART 89.0 05/21/2019 0154   HCO3 28.2 (H) 05/21/2019 0154   TCO2 30 05/21/2019 0154   ACIDBASEDEF 1.0 05/20/2019 0059   O2SAT 95.0 05/21/2019 0154     Coagulation Profile: No results for input(s): INR, PROTIME in the last 168 hours.  Cardiac Enzymes: No results for input(s): CKTOTAL, CKMB, CKMBINDEX, TROPONINI in the last 168 hours.  HbA1C: Hgb A1c MFr Bld  Date/Time Value Ref Range Status  05/09/2019 02:17 AM 8.5 (H) 4.8 - 5.6 % Final    Comment:    (NOTE) Pre diabetes:          5.7%-6.4% Diabetes:              >6.4% Glycemic control for   <7.0% adults with diabetes   10/06/2015 03:47 PM 6.5 (H) 4.8 - 5.6 % Final    Comment:             Pre-diabetes: 5.7 - 6.4          Diabetes: >6.4          Glycemic control for adults with diabetes: <7.0     CBG: Recent Labs  Lab 05/20/19 1524 05/20/19 2006 05/20/19 2311 05/21/19 0415 05/21/19 0728  GLUCAP 234* 310* 326* 346* 357*     Critical care time: 35 minutes   Noe Gens, MSN, NP-C [facility] 05/21/2019, 11:40 AM   Please see [email or web address] for pager details.

## 2019-05-21 NOTE — Progress Notes (Signed)
eLink Physician-Brief Progress Note Patient Name: Max Lozano DOB: 01-20-1951 MRN: [id number]   Date of Service  05/21/2019  HPI/Events of Note  Pt saturation fluctuating from mid 80's to low 90's, ABG shows PO2 of 45 which is higher than earlier figure of 33, I considered proning him but per RN, Dr. Kendrick Fries has felt he's too unstable to prone, therefore will monitor him closely for now, as long as he is fluctuating between low 90's and dips into mid 80's.  eICU Interventions  Monitor Pt closely, no intervention for now.        Thomasene Lot Kanika Bungert 05/21/2019, 10:51 PM

## 2019-05-21 NOTE — Progress Notes (Signed)
Wife called for consent for platelet administration and hemodialysis catheter insertion.  Procedures explained in detail and independently reviewed risks of platelet administration and HD catheter insertion with wife. She is in agreement to proceed with platelets and hemodialysis.  See consents on chart.     Canary Brim, MSN, NP-C [facility] 05/21/2019, 4:31 PM   Please see [email or web address] for pager details.

## 2019-05-22 ENCOUNTER — Inpatient Hospital Stay (HOSPITAL_COMMUNITY): Payer: Private Health Insurance - Indemnity

## 2019-05-22 LAB — COMPREHENSIVE METABOLIC PANEL
ALT: 42 U/L (ref 0–44)
AST: 43 U/L — ABNORMAL HIGH (ref 15–41)
Albumin: 2.3 g/dL — ABNORMAL LOW (ref 3.5–5.0)
Alkaline Phosphatase: 160 U/L — ABNORMAL HIGH (ref 38–126)
Anion gap: 13 (ref 5–15)
BUN: 140 mg/dL — ABNORMAL HIGH (ref 8–23)
CO2: 29 mmol/L (ref 22–32)
Calcium: 7.3 mg/dL — ABNORMAL LOW (ref 8.9–10.3)
Chloride: 102 mmol/L (ref 98–111)
Creatinine, Ser: 3.43 mg/dL — ABNORMAL HIGH (ref 0.61–1.24)
GFR calc Af Amer: 20 mL/min — ABNORMAL LOW (ref >60)
GFR calc non Af Amer: 18 mL/min — ABNORMAL LOW (ref >60)
Glucose, Bld: 182 mg/dL — ABNORMAL HIGH (ref 70–99)
Potassium: 4.6 mmol/L (ref 3.5–5.1)
Sodium: 144 mmol/L (ref 135–145)
Total Bilirubin: 0.5 mg/dL (ref 0.3–1.2)
Total Protein: 4.7 g/dL — ABNORMAL LOW (ref 6.5–8.1)

## 2019-05-22 LAB — PREPARE PLATELET PHERESIS: Unit division: 0

## 2019-05-22 LAB — CBC WITH DIFFERENTIAL/PLATELET
Abs Immature Granulocytes: 0.09 10*3/uL — ABNORMAL HIGH (ref 0.00–0.07)
Basophils Absolute: 0 10*3/uL (ref 0.0–0.1)
Basophils Relative: 0 %
Eosinophils Absolute: 0.2 10*3/uL (ref 0.0–0.5)
Eosinophils Relative: 2 %
HCT: 31.9 % — ABNORMAL LOW (ref 39.0–52.0)
Hemoglobin: 9.8 g/dL — ABNORMAL LOW (ref 13.0–17.0)
Immature Granulocytes: 1 %
Lymphocytes Relative: 18 %
Lymphs Abs: 2.3 10*3/uL (ref 0.7–4.0)
MCH: 30.7 pg (ref 26.0–34.0)
MCHC: 30.7 g/dL (ref 30.0–36.0)
MCV: 100 fL (ref 80.0–100.0)
Monocytes Absolute: 0.7 10*3/uL (ref 0.1–1.0)
Monocytes Relative: 6 %
Neutro Abs: 9.2 10*3/uL — ABNORMAL HIGH (ref 1.7–7.7)
Neutrophils Relative %: 73 %
Platelets: 53 10*3/uL — ABNORMAL LOW (ref 150–400)
RBC: 3.19 MIL/uL — ABNORMAL LOW (ref 4.22–5.81)
RDW: 15.9 % — ABNORMAL HIGH (ref 11.5–15.5)
WBC: 12.5 10*3/uL — ABNORMAL HIGH (ref 4.0–10.5)
nRBC: 0 % (ref 0.0–0.2)

## 2019-05-22 LAB — MAGNESIUM: Magnesium: 3 mg/dL — ABNORMAL HIGH (ref 1.7–2.4)

## 2019-05-22 LAB — GLUCOSE, CAPILLARY: Glucose-Capillary: 191 mg/dL — ABNORMAL HIGH (ref 70–99)

## 2019-05-22 LAB — HEPARIN INDUCED PLATELET AB (HIT ANTIBODY): Heparin Induced Plt Ab: 0.234 OD (ref 0.000–0.400)

## 2019-05-22 LAB — APTT: aPTT: 30 seconds (ref 24–36)

## 2019-05-22 LAB — BPAM PLATELET PHERESIS
Blood Product Expiration Date: 202101151650
ISSUE DATE / TIME: 202101141707
Unit Type and Rh: 8400

## 2019-05-22 LAB — POCT I-STAT 7, (LYTES, BLD GAS, ICA,H+H)
Acid-Base Excess: 3 mmol/L — ABNORMAL HIGH (ref 0.0–2.0)
Bicarbonate: 30.7 mmol/L — ABNORMAL HIGH (ref 20.0–28.0)
Calcium, Ion: 1.11 mmol/L — ABNORMAL LOW (ref 1.15–1.40)
HCT: 27 % — ABNORMAL LOW (ref 39.0–52.0)
Hemoglobin: 9.2 g/dL — ABNORMAL LOW (ref 13.0–17.0)
O2 Saturation: 65 %
Patient temperature: 36.5
Potassium: 5.3 mmol/L — ABNORMAL HIGH (ref 3.5–5.1)
Sodium: 140 mmol/L (ref 135–145)
TCO2: 33 mmol/L — ABNORMAL HIGH (ref 22–32)
pCO2 arterial: 67.7 mmHg (ref 32.0–48.0)
pH, Arterial: 7.262 — ABNORMAL LOW (ref 7.350–7.450)
pO2, Arterial: 39 mmHg — CL (ref 83.0–108.0)

## 2019-05-22 LAB — HAPTOGLOBIN: Haptoglobin: 226 mg/dL (ref 32–363)

## 2019-05-22 LAB — HEPARIN LEVEL (UNFRACTIONATED): Heparin Unfractionated: 0.13 IU/mL — ABNORMAL LOW (ref 0.30–0.70)

## 2019-05-22 MED ORDER — BUDESONIDE 0.5 MG/2ML IN SUSP
RESPIRATORY_TRACT | Status: AC
  Administered 2019-05-22: 0.5 mg
  Filled 2019-05-22: qty 2

## 2019-05-22 MED ORDER — BUDESONIDE 0.25 MG/2ML IN SUSP: 0.2500 mg | Freq: Once | RESPIRATORY_TRACT | Status: AC

## 2019-05-22 MED ORDER — EPINEPHRINE HCL 5 MG/250ML IV SOLN IN NS
0.5000 ug/min | INTRAVENOUS | Status: DC
  Filled 2019-05-22: qty 250

## 2019-05-22 MED ORDER — MUPIROCIN 2 % EX OINT
TOPICAL_OINTMENT | Freq: Two times a day (BID) | CUTANEOUS | Status: DC
  Administered 2019-05-22: 1 via NASAL
  Filled 2019-05-22: qty 22

## 2019-05-22 MED ORDER — SODIUM CHLORIDE 0.9 % IV SOLN
500.0000 [IU]/h | INTRAVENOUS | Status: DC
  Filled 2019-05-22: qty 2

## 2019-05-22 MED ORDER — HEPARIN (PORCINE) IN NACL 2-0.9 UNITS/ML
Freq: Once | INTRAMUSCULAR | Status: AC
  Filled 2019-05-22: qty 500

## 2019-05-22 MED FILL — Medication: Qty: 1 | Status: AC

## 2019-05-23 LAB — CULTURE, BAL-QUANTITATIVE W GRAM STAIN: Culture: 6000 — AB

## 2019-05-23 LAB — GLUCOSE, CAPILLARY
Glucose-Capillary: 157 mg/dL — ABNORMAL HIGH (ref 70–99)
Glucose-Capillary: 150 mg/dL — ABNORMAL HIGH (ref 70–99)

## 2019-05-24 LAB — CULTURE, BLOOD (ROUTINE X 2)
Culture: NO GROWTH
Culture: NO GROWTH

## 2019-06-08 NOTE — Progress Notes (Signed)
25mg  Dilaudid wasted with , RN

## 2019-06-08 NOTE — Procedures (Signed)
PCCM Video Bronchoscopy Procedure Note  The patient was informed of the risks (including but not limited to bleeding, infection, respiratory failure, lung injury, tooth/oral injury) and benefits of the procedure and gave consent, see chart.  Indication: severe ARDS, hypoxemia, near arrest  Post Procedure Diagnosis: as above  Location: [facility] ICu  Condition pre procedure: critically ill, on ventilator  Medications for procedure: versed infusion, fentanyl infusion  Procedure description: The bronchoscope was introduced through the endotracheal tube and passed to the bilateral lungs to the level of the subsegmental bronchi throughout the tracheobronchial tree.  Airway exam revealed bloody secretions bilaterally, none were occluding the airway.  They were suctioned.  No masses, airways were patent.  Mild bronchomalacia with cough.  Procedures performed: none  Specimens sent: none  Condition post procedure: critically ill, on ventilator  EBL: none from procedure  Complications:  None immediate  Heber , MD [place] [facility] Pager: [phone] Cell: [phone] If no response, call [phone]

## 2019-06-08 NOTE — Progress Notes (Signed)
NAME:  Max Lozano, MRN:  [id number], DOB:  [birth year], LOS: 8 ADMISSION DATE:  06/07/2019, CONSULTATION DATE:  1/9 REFERRING MD:  Dante Gang, CHIEF COMPLAINT:  Dyspnea   Brief History   69 y/o male admitted on 1/1 with severe acute respiratory failure with hypoxemia, required intubation on 1/9.  Developed worsening thrombocytopenia and renal failure since admission to ICU.  Past Medical History  HTN AFib Sick sinus sydrome (pacemaker placed)  Significant Hospital Events   1/01 Admit  1/07 Worsening infiltrates, O2 needs 1/09 Tx to ICU, intubated 1/12 Worsening metabolic acidosis, AKI, concern for worsening infiltrate on L due to bleeding  1/13 CXR worse bilaterally, bleeding from ETT.  DDAVP. FOB for hemoptysis. AKI 1/14 started CRRT  Consults:  PCCM  Procedures:  ETT 1/9 >> TLC 1/10 >>  Significant Diagnostic Tests:  COVID + 12/31 CXR with diffuse bilateral opacities consistent with dx  Micro Data:  COVID 1/1 >> positive HIV 1/1 >> neg MRSA PCR 1/5 >> positive BCx2 1/10 >>  Tracheal Aspirate 1/10 >> c albicans BAL 1/13 >>   Antimicrobials:  Cefepime 1/4 >> 1/8 Cefepime 1/10 >> Vanco 1/10 >> 1/11   Interim history/subjective:  hgb down some Started CRRT  Objective   Blood pressure 103/66, pulse 92, temperature 98.5 F (36.9 C), temperature source Axillary, resp. rate (!) 30, height _0  (1.778 m), weight 108.6 kg, SpO2 95 %. CVP:  [4 mmHg-17 mmHg] 9 mmHg  Vent Mode: PRVC FiO2 (%):  [80 %-100 %] 100 % Set Rate:  [30 bmp] 30 bmp Vt Set:  [580 mL] 580 mL PEEP:  [16 cmH20-18 cmH20] 18 cmH20 Plateau Pressure:  [35 cmH20-45 cmH20] 44 cmH20   Intake/Output Summary (Last 24 hours) at 16-Jun-2019 0810 Last data filed at 06-16-2019 0800 Gross per 24 hour  Intake 4108.75 ml  Output 1685 ml  Net 2423.75 ml   Filed Weights   05/20/19 0437 05/21/19 0421 2019/06/16 0500  Weight: 104.3 kg 108.5 kg 108.6 kg    Examination: General:  In bed on vent HENT: NCAT ETT in  place PULM: Rhonchi B, vent supported breathing CV: RRR, no mgr GI: BS+, soft, nontender MSK: normal bulk and tone Neuro: sedated on vent  1/15 CXR> pneumomediastinum? Infiltrates bilaterally  Resolved Hospital Problem list     Assessment & Plan:  ARDS due to COVID 19 > plateau 1/15 41 cmH20, severe hypoxemia Hemoptysis Aspiration pneumonia Continue mechanical ventilation per ARDS protocol Target TVol 6-8cc/kgIBW Target Plateau Pressure < 30cm H20 Target driving pressure less than 15 cm of water Target PaO2 55-65: titrate PEEP/FiO2 per protocol As long as PaO2 to FiO2 ratio is less than 1:150 position in prone position for 16 hours a day Check CVP daily if CVL in place Target CVP less than 4, diurese as necessary Ventilator associated pneumonia prevention protocol 1/15> ABG today, continue nimbex, prone today  Atrial fibrillation with RVR Tele amiodarone  Need for sedation for mechanical ventilation RASS target -5 Continuous neuromuscular blockade protocol  AKI with anasarca cotninue CRRT Monitor BMET and UOP Replace electrolytes as needed Remove volume today  Thrombocytopenia > unclear cause, marrow suppression? No TTP, doesn't look like TTP Monitor for bleeding  Constipation > resolving monitor  Hypertension Hyperlipidemia Monitor BP  Hyeprglycemia SSI Monitor glucose   Best practice:  Diet: tube feeding Pain/Anxiety/Delirium protocol (if indicated): as above VAP protocol (if indicated): yes DVT prophylaxis: scd GI prophylaxis: famotidine Glucose control: as above Mobility: bed rest Code Status: made dnr Family Communication:  multiple lengthy conversations, see detailed note in chart Disposition: remain in ICU  Labs   CBC: Recent Labs  Lab 05/18/19 0325 05/18/19 1517 05/19/19 0509 05/19/19 0509 05/19/19 1440 05/20/19 0059 05/20/19 0445 05/20/19 2019 05/21/19 0154 05/21/19 0440 05/21/19 1225 05/21/19 1436 05/21/19 2146 2019/06/15  0450  WBC 16.8*   < > 19.4*  --  14.0*  --  16.5*  --   --  14.9*  --   --   --  12.5*  NEUTROABS 10.6*  --  13.3*  --   --   --  11.5*  --   --  10.4*  --   --   --  9.2*  HGB 12.6*   < > 12.6*   < > 11.3*   < > 12.1*   < > 10.2* 10.0*  --  11.2* 10.2* 9.8*  HCT 41.0   < > 41.0   < > 36.6*   < > 39.2   < > 30.0* 32.9*  --  33.0* 30.0* 31.9*  MCV 99.0   < > 101.0*  --  99.5  --  99.7  --   --  100.6*  --   --   --  100.0  PLT 66*   < > 60*   < > 54*  --  51*  --   --  36* 33*  --   --  53*   < > = values in this interval not displayed.    Basic Metabolic Panel: Recent Labs  Lab 05/16/19 1824 05/16/19 2248 05/17/19 0600 05/17/19 0600 05/17/19 1207 05/17/19 1700 05/18/19 0325 05/18/19 1651 05/19/19 1440 05/19/19 1440 05/19/19 2110 05/20/19 0059 05/20/19 0445 05/20/19 2019 05/21/19 0154 05/21/19 0440 05/21/19 1436 05/21/19 2146 06/15/2019 0450  NA  --    < > 137   < >   < >  --  141   < > 143   < > 143   < > 138   < > 140 142 142 143 144  K  --    < > 5.3*   < >   < >  --  5.1   < > 5.9*   < > 5.5*   < > 5.1   < > 5.2* 5.3* 4.8 4.4 4.6  CL  --   --  104   < >  --   --  108   < > 110  --  109  --  105  --   --  104  --   --  102  CO2  --   --  21*   < >  --   --  22   < > 24  --  24  --  25  --   --  28  --   --  29  GLUCOSE  --   --  189*   < >  --   --  164*   < > 189*  --  154*  --  160*  --   --  370*  --   --  182*  BUN  --   --  83*   < >  --   --  97*   < > 125*  --  140*  --  139*  --   --  162*  --   --  140*  CREATININE  --   --  1.96*   < >  --   --  1.83*   < > 2.45*  --  2.56*  --  2.53*  --   --  3.68*  --   --  3.43*  CALCIUM  --   --  7.7*   < >  --   --  7.4*   < > 7.6*  --  7.5*  --  7.4*  --   --  7.3*  --   --  7.3*  MG 2.7*  --  2.7*  --   --  2.7* 2.9*  --   --   --   --   --   --   --   --   --   --   --  3.0*  PHOS 7.9*  --  7.5*  --   --  6.9* 6.6*  --   --   --   --   --   --   --   --   --   --   --   --    < > = values in this interval not displayed.    GFR: Estimated Creatinine Clearance: 26.1 mL/min (A) (by C-G formula based on SCr of 3.43 mg/dL (H)). Recent Labs  Lab 05/19/19 1440 05/20/19 0445 05/20/19 0500 05/21/19 0440 06-11-2019 0450  PROCALCITON  --   --  0.76  --   --   WBC 14.0* 16.5*  --  14.9* 12.5*    Liver Function Tests: Recent Labs  Lab 05/16/19 0648 05/17/19 0600 05/18/19 0325 05/19/19 0509 11-Jun-2019 0450  AST 52* 35 33 40 43*  ALT 79* 62* 42 37 42  ALKPHOS 218* 200* 155* 161* 160*  BILITOT 1.3* 1.2 0.8 0.7 0.5  PROT 5.5* 6.0* 4.9* 4.6* 4.7*  ALBUMIN 3.1* 3.4* 2.7* 2.4* 2.3*   No results for input(s): LIPASE, AMYLASE in the last 168 hours. No results for input(s): AMMONIA in the last 168 hours.  ABG    Component Value Date/Time   PHART 7.309 (L) 05/21/2019 2146   PCO2ART 65.0 (H) 05/21/2019 2146   PO2ART 45.0 (L) 05/21/2019 2146   HCO3 33.2 (H) 05/21/2019 2146   TCO2 35 (H) 05/21/2019 2146   ACIDBASEDEF 1.0 05/20/2019 0059   O2SAT 78.0 05/21/2019 2146     Coagulation Profile: Recent Labs  Lab 05/21/19 1225  INR 1.2    Cardiac Enzymes: No results for input(s): CKTOTAL, CKMB, CKMBINDEX, TROPONINI in the last 168 hours.  HbA1C: Hgb A1c MFr Bld  Date/Time Value Ref Range Status  05/09/2019 02:17 AM 8.5 (H) 4.8 - 5.6 % Final    Comment:    (NOTE) Pre diabetes:          5.7%-6.4% Diabetes:              >6.4% Glycemic control for   <7.0% adults with diabetes   10/06/2015 03:47 PM 6.5 (H) 4.8 - 5.6 % Final    Comment:             Pre-diabetes: 5.7 - 6.4          Diabetes: >6.4          Glycemic control for adults with diabetes: <7.0     CBG: Recent Labs  Lab 05/21/19 1149 05/21/19 1552 05/21/19 2043 05/21/19 2313 Jun 11, 2019 0309  GLUCAP 347* 317* 283* 246* 191*       Critical care time: 90 minutes outisde of procedures      Roselie Awkward, MD [place] [facility] Pager: [phone] Cell: [phone] If no response,  call [phone]

## 2019-06-08 NOTE — Progress Notes (Signed)
Inpatient Diabetes Program Recommendations  AACE/ADA: New Consensus Statement on Inpatient Glycemic Control (2015)  Target Ranges:  Prepandial:   less than 140 mg/dL      Peak postprandial:   less than 180 mg/dL (1-2 hours)      Critically ill patients:  140 - 180 mg/dL   Lab Results  Component Value Date   GLUCAP 191 (H) June 02, 2019   HGBA1C 8.5 (H) 05/09/2019    Review of Glycemic Control Results for Max Lozano, Max Lozano (MRN [id number]) as of 06-02-19 12:56  Ref. Range 05/21/2019 20:43 05/21/2019 23:13 2019-06-02 03:09  Glucose-Capillary Latest Ref Range: 70 - 99 mg/dL 280 (H) 034 (H) 917 (H)   Diabetes history: Type 2 DM Outpatient Diabetes medications: none Current orders for Inpatient glycemic control: Novolog 5 units Q4H, Levemir 18 units BID  Inpatient Diabetes Program Recommendations:    Consider increasing Levemir 22 units BID.   Thanks, Lujean Rave, MSN, RNC-OB Diabetes Coordinator [phone] (8a-5p)

## 2019-06-08 NOTE — Progress Notes (Signed)
[facility] P[facility]M  I have been at Mr. Hayduk bedside for the last hour resuscitating him.  Late this morning he developed atrial fibrillation with RVR, hypotension and worsening hypoxemia  We started levophed for hypotension.  I came to his bedside for emergent cardioverion, however upon my arrival he converted into sinus rhythm HR 70's-80's with no signfiicant improvement in his oxygenation.  On lung exam he had loud rhonchi/squeaking breath sounds on the left.  A STAT CXR showed no new pneumothorax or other abnormality other than severe bilateral airspace disease. He continued to deteriorate.  We provided several doses of IV epinephrine, bicarbonate, calcium and D50.  An emergent arterial line was placed.  I performed an emergent bronchoscopy which showed bloody secretions in the lungs bilaterally, but no obstructing clot or mucus plug.  I then performed an emergent bedside utrasound of his chest which showed sliding lung bilaterally.  Bedside echocardiogram showed no evidence of tamponade, his LV was well visualized and was quite weak with slow, intermittent beats that did not correlate with his electrical activity.  During these efforts we started an epinephrine infusion which was titrated to the maximum dose.  Despite these efforts his oxygenation and blood pressure continued to deteriorate.    I explained to his wife that there was nothing more that we can do, she voiced understanding.  I explained that CPR/chest compressions would be ineffective and we are going to stop vasopressor agents at this point because they were not providing medical benefit.  Rayfield Citizen spoke to him by phone.  She requested that her daughter be allowed to speak with him.  We withdrew vasopressor support.  Heber [place], MD [place] [facility] Pager: [phone] Cell: [phone] If no response, call [phone]

## 2019-06-08 NOTE — Progress Notes (Signed)
Patient spouse updated via phone on patient status and plan of care. All questions answered at present time.

## 2019-06-08 NOTE — Death Summary Note (Signed)
DEATH SUMMARY   Patient Details  Name: Max Lozano MRN: [id number] DOB: 04/26/1951  Admission/Discharge Information   Admit Date:  05/10/2019  Date of Death: Date of Death: May 24, 2019  Time of Death: Time of Death: 06/18/40  Length of Stay: Jun 22, 2022  Referring Physician: Birdie Sons, MD   Reason(s) for Hospitalization  COVID 06/27/22  Diagnoses  Preliminary cause of death:    COVID 06-27-22 Secondary Diagnoses (including complications and co-morbidities):  Principal Problem:   Pneumonia due to COVID-19 virus Active Problems:   Hyperlipidemia   HYPERTENSION, BENIGN ESSENTIAL   Permanent atrial fibrillation (HCC)   Sinoatrial node dysfunction ([place])   Acute respiratory failure with hypoxia ([place])   ARDS (adult respiratory distress syndrome) (HCC)   Bleeding   Chronic atrial fibrillation ([place])   Abdominal distention   Brief Hospital Course (including significant findings, care, treatment, and services provided and events leading to death)  Max Lozano is a 69 y/o male admitted on 1/1 with severe acute respiratory failure with hypoxemia, required intubation on 1/9.  Developed worsening thrombocytopenia and renal failure since admission to ICU. 09-May-2022 Admit  1/07 Worsening infiltrates, O2 needs 1/09 Tx to ICU, intubated 1/12 Worsening metabolic acidosis, AKI, concern for worsening infiltrate on L due to bleeding  1/13 CXR worse bilaterally, bleeding from ETT.  DDAVP. FOB for hemoptysis. AKI 1/14 started CRRT May 23, 2022 progressive shock, aggressive resuscitation with fluid, vasopressors, lines placed, refractory shock.  Discussed poor prognosis with his wife.  Died in ICU despite mechanical ventilatory support and vasopressor support.    Pertinent Labs and Studies  Significant Diagnostic Studies DG Chest 1 View  Result Date: 05/17/2019 CLINICAL DATA:  Central line placement EXAM: CHEST  1 VIEW COMPARISON:  May 17, 2019 FINDINGS: A new right central line has been placed, terminating in the SVC.  The ETT is in good position. The NG tube is in good position. No pneumothorax. Bilateral pulmonary infiltrates remain but are significantly improved in the interval. No change in the cardiomediastinal silhouette. IMPRESSION: 1. Support apparatus as above. No pneumothorax after right central line placement. 2. Persistent diffuse bilateral pulmonary infiltrates, improved in the interval. Electronically Signed   By: Dorise Bullion III M.D   On: 05/17/2019 17:17   CT ANGIO CHEST PE W OR WO CONTRAST  Result Date: 05/11/2019 CLINICAL DATA:  Dyspnea on exertion. Hypoxia. COVID-19 pneumonia. Currently being treated with remdesivir and steroids. EXAM: CT ANGIOGRAPHY CHEST WITH CONTRAST TECHNIQUE: Multidetector CT imaging of the chest was performed using the standard protocol during bolus administration of intravenous contrast. Multiplanar CT image reconstructions and MIPs were obtained to evaluate the vascular anatomy. CONTRAST:  166m OMNIPAQUE IOHEXOL 350 MG/ML SOLN COMPARISON:  One-view chest x-ray 05/07/2019 FINDINGS: Cardiovascular: The heart mildly enlarged. Pacing wires are in place. Coronary artery calcifications are present. Aortic arch great vessel origins are within normal limits. Pulmonary artery opacification is excellent. No focal filling defects are present to suggest pulmonary emboli. Mediastinum/Nodes: Subcentimeter mediastinal lymph nodes are likely reactive. Small hiatal hernia is present. Esophagus is otherwise unremarkable. Thoracic inlet is within normal limits. Water density the collection adjacent to the right atrium is consistent with a benign pericardial cyst. Lungs/Pleura: Extensive ground-glass attenuation is present throughout both lungs, most prominent at the lung bases. No significant pleural disease or pneumothorax is present. There are no significant effusions. Upper Abdomen: There is diffuse fatty infiltration of the liver. No solid organ lesions are present. No significant  retroperitoneal adenopathy is present. Musculoskeletal: Degenerative changes are  present in the thoracic spine. Fused anterior osteophytes are noted throughout the lower thoracic spine. Vertebral body heights are maintained. No focal lytic or blastic lesions are present. Review of the MIP images confirms the above findings. IMPRESSION: 1. No evidence for pulmonary embolus. 2. Mild cardiomegaly without failure. 3. Extensive ground-glass attenuation throughout both lungs, most prominent at the lung bases. Findings are most concerning for multifocal pneumonia in this patient with known COVID-19 disease. 4. Small hiatal hernia. 5. Hepatic steatosis. Electronically Signed   By: San Morelle M.D.   On: 05/11/2019 13:03   US Renal  Result Date: May 25, 2019 CLINICAL DATA:  Acute kidney injury. EXAM: RENAL / URINARY TRACT ULTRASOUND COMPLETE COMPARISON:  None. FINDINGS: Right Kidney: Renal measurements: 11.9 x 6.0 x 6.8 cm = volume: 252.3 mL . Echogenicity within normal limits. No mass or hydronephrosis visualized. Left Kidney: Renal measurements: 12.1 x 6.1 x 7.2 cm = volume: 276.8 mL. Echogenicity within normal limits. No mass or hydronephrosis visualized. Bladder: Bladder decompressed. Other: None. IMPRESSION: 1. Normal renal ultrasound. Electronically Signed   By: Lajean Manes M.D.   On: 05-25-2019 11:32   DG CHEST PORT 1 VIEW  Result Date: 2019-05-25 CLINICAL DATA:  ARDS.  COVID infection. EXAM: PORTABLE CHEST 1 VIEW COMPARISON:  Portable chest 05/25/2019. FINDINGS: Endotracheal tube, feeding tube, right IJ line, left IJ line in stable position. Large caliber tubing noted over the right chest, this may be extrinsic to the patient. Stable cardiomegaly. Heart size stable. Diffuse severe bilateral pulmonary infiltrates again noted without significant interim change. IMPRESSION: 1. Lines and tubes stable position. Large caliber tubing noted over the right chest, this may be extrinsic to the patient. Clinical  correlation suggested. 2. Diffuse severe bilateral pulmonary infiltrates again noted without significant interim change. Electronically Signed   By: Marcello Moores  Register   On: 05-25-2019 12:42   DG Chest Port 1 View  Result Date: 05/25/19 CLINICAL DATA:  Acute respiratory failure.  Hypoxia. EXAM: PORTABLE CHEST 1 VIEW COMPARISON:  05/21/2019. FINDINGS: Endotracheal tube, feeding tube, bilateral IJ lines in stable position. Cardiac pacer stable position. Stable cardiomegaly. Diffuse severe bilateral pulmonary infiltrates again noted without significant interim change. No pleural effusion or pneumothorax. IMPRESSION: 1.  Lines and tubes in stable position. 2.  Stable cardiomegaly. 3. Diffuse severe bilateral pulmonary infiltrates again noted without interim change. Electronically Signed   By: Marcello Moores  Register   On: 2019-05-25 07:43   DG CHEST PORT 1 VIEW  Result Date: 05/21/2019 CLINICAL DATA:  Placement of central venous catheter EXAM: PORTABLE CHEST 1 VIEW COMPARISON:  05/21/2019 FINDINGS: Interval placement of left internal jugular Vas-Cath with the tip in the SVC. Right central line, endotracheal tube and feeding tube as well as left pacer remain in place, unchanged. Diffuse bilateral airspace disease again noted. Cardiomegaly. No visible effusions or pneumothorax. IMPRESSION: Left Vas-Cath placement with the tip in the SVC.  No pneumothorax. Stable support devices otherwise. Severe diffuse bilateral airspace disease, unchanged. Electronically Signed   By: Rolm Baptise M.D.   On: 05/21/2019 18:54   DG CHEST PORT 1 VIEW  Result Date: 05/21/2019 CLINICAL DATA:  Acute respiratory failure with hypoxia. EXAM: PORTABLE CHEST 1 VIEW COMPARISON:  May 20, 2019. FINDINGS: Stable cardiomegaly. Endotracheal and nasogastric tubes are unchanged in position. Right internal jugular catheter is unchanged. Left-sided pacemaker is unchanged. No pneumothorax is noted. Stable multifocal lung opacities are noted  consistent with multifocal pneumonia. Bony thorax is unremarkable. IMPRESSION: Stable support apparatus. Stable multifocal lung opacities are noted  consistent with multifocal pneumonia. Electronically Signed   By: Marijo Conception M.D.   On: 05/21/2019 07:46   DG CHEST PORT 1 VIEW  Result Date: 05/20/2019 CLINICAL DATA:  Acute respiratory failure with hypoxia. COVID-19. EXAM: PORTABLE CHEST 1 VIEW 12:32 p.m. COMPARISON:  05/20/2019 at 6:29 a.m. and 05/19/2019 and 05/18/2019 FINDINGS: Endotracheal tube is 6.8 cm above the carina in good position. NG tube tip is below the diaphragm. Pacemaker in place. Central venous catheter tip is in the superior vena cava just below the level of the carina in good position. The diffuse bilateral pulmonary infiltrates have improved since the earlier study on the same date. The air collection seen medially in the right midzone on the prior study of this same date is no longer visible. No pneumothorax. No pleural effusions. Heart size is normal. IMPRESSION: 1. No pneumothorax. 2. Improving bilateral pulmonary infiltrates. 3. Endotracheal tube is 6.8 cm above the carina. 4. Central venous catheter in good position. Electronically Signed   By: Lorriane Shire M.D.   On: 05/20/2019 14:14   DG Chest Port 1 View  Addendum Date: 05/20/2019   ADDENDUM REPORT: 05/20/2019 09:12 ADDENDUM: Recommend short-term radiographic follow-up. Findings conveyed toRN [place]on 05/20/2019  at09:03. Electronically Signed   By: Suzy Bouchard M.D.   On: 05/20/2019 09:12   Result Date: 05/20/2019 CLINICAL DATA:  Order for acute respiratory failure with hypoxia EXAM: PORTABLE CHEST 1 VIEW COMPARISON:  05/19/2019 FINDINGS: Endotracheal and NG tube unchanged. Central venous line unchanged. There is diffuse airspace disease which is slightly worsened comparison exam. Poor definition of the hemidiaphragms. There is new central gas around the LEFT and RIGHT mainstem bronchus. These pockets of gas appear to  contain. IMPRESSION: 1. Stable support apparatus. 2. Worsening airspace disease with near confluent diffuse airspace disease throughout the lungs. 3. New central gas projecting over the peribronchial mediastinum of unclear etiology. This could represent a contained pneumothorax or pneumomediastinum. If patient prone, gas could be dorsal. Electronically Signed: By: Suzy Bouchard M.D. On: 05/20/2019 09:00   DG Chest Port 1 View  Result Date: 05/19/2019 CLINICAL DATA:  Acute respiratory failure.  Hypoxia. EXAM: PORTABLE CHEST 1 VIEW COMPARISON:  05/18/2019. FINDINGS: Endotracheal tube and right IJ line in stable position. NG tube tip again noted above the left hemidiaphragm. Advancement of approximately 15 cm should be considered. Cardiac pacer in stable position. Heart size stable. Diffuse progressive bilateral pulmonary infiltrates. Small left pleural effusion cannot be excluded. No pneumothorax. IMPRESSION: 1. NG tube tip noted above left hemidiaphragm. Advancement of approximately 15 cm should be considered. Endotracheal tube and right IJ line stable position. 2.  Cardiac pacer stable position.  Heart size stable. 3. Diffuse progressive bilateral pulmonary infiltrates. Small left pleural effusion cannot be excluded. Electronically Signed   By: Marcello Moores  Register   On: 05/19/2019 07:28   DG CHEST PORT 1 VIEW  Result Date: 05/18/2019 CLINICAL DATA:  Bleeding into chest tube. EXAM: PORTABLE CHEST 1 VIEW COMPARISON:  05/17/2019 FINDINGS: Endotracheal tube tip is 3 cm above the carina. Orogastric tube tip is just past the gastroesophageal junction. Consider advancing this. Right internal jugular central line tip is in the SVC 3 cm above the right atrium. Dual lead pacemaker is unchanged. Widespread pulmonary opacity persists consistent with bronchopneumonia. No change radiographically. If anything, there may be slightly better aeration in the left lower lobe. IMPRESSION: Lines and tubes satisfactory, except  that the orogastric or nasogastric tube tip is just past the gastroesophageal junction. Widespread pulmonary  opacity persists. No worsening. Question slight improved aeration at the left base. Electronically Signed   By: Nelson Chimes M.D.   On: 05/18/2019 15:33   DG CHEST PORT 1 VIEW  Result Date: 05/17/2019 CLINICAL DATA:  ARDS. EXAM: PORTABLE CHEST 1 VIEW COMPARISON:  05/16/2019 FINDINGS: 0413 hours. Endotracheal tube tip is 5.8 cm above the base of the carina. NG tube tip is in the proximal stomach. Fairly marked interval progression of bilateral airspace disease, nearly confluent in the right mid and lower lung and parahilar left lung. Heart size upper normal. Left permanent pacemaker again noted. The visualized bony structures of the thorax are intact. IMPRESSION: 1. Fairly marked interval progression of bilateral airspace disease, now nearly confluent in the right mid and lower lung and parahilar left lung. 2. Stable support apparatus. Electronically Signed   By: Misty Stanley M.D.   On: 05/17/2019 05:22   DG CHEST PORT 1 VIEW  Result Date: 05/16/2019 CLINICAL DATA:  69 year old male with enteric tube placement. EXAM: PORTABLE ABDOMEN - 1 VIEW; PORTABLE CHEST - 1 VIEW COMPARISON:  Chest radiograph dated 05/14/2019. FINDINGS: Endotracheal tube with tip approximately 4.5 cm above the carina. Enteric tube extends into the left upper abdomen with tip in the proximal stomach and side-port just distal to the GE junction. Bilateral airspace opacities primarily involving the mid to lower lung fields again noted. No large pleural effusion or pneumothorax. Stable cardiac silhouette. Left pectoral pacemaker device. No bowel dilatation or evidence of obstruction. The osseous structures and soft tissues are grossly unremarkable. IMPRESSION: 1. Endotracheal tube above the carina. 2. Enteric tube with tip in the proximal stomach and side-port just distal to the GE junction. 3. Bilateral airspace opacities as seen  previously. Electronically Signed   By: Anner Crete M.D.   On: 05/16/2019 02:49   DG CHEST PORT 1 VIEW  Result Date: 05/14/2019 CLINICAL DATA:  COVID-19 EXAM: PORTABLE CHEST 1 VIEW COMPARISON:  05/07/2019 FINDINGS: Similar elevation of the right hemidiaphragm. Persistent bilateral opacities with relative sparing of the upper lungs. Aeration has worsened since the prior study. No significant pleural effusion. No pneumothorax. Similar cardiomediastinal contours. Left chest wall pacemaker is again noted. IMPRESSION: Multifocal pneumonia with worsening aeration since 05/07/2019. Electronically Signed   By: Macy Mis M.D.   On: 05/14/2019 08:53   DG Chest Portable 1 View  Result Date: 05/07/2019 CLINICAL DATA:  Shortness of breath EXAM: PORTABLE CHEST 1 VIEW COMPARISON:  10/15/2014 FINDINGS: Left-sided pacing device with leads over the right atrium and right ventricle. Vague ground-glass opacity in the mid lung zones. Enlarged cardiomediastinal silhouette. No pleural effusion or pneumothorax. IMPRESSION: Suspect vague ground-glass opacities in the mid lung zones as may be seen with pneumonia, possible atypical or viral pneumonia. Electronically Signed   By: Donavan Foil M.D.   On: 05/07/2019 23:22   DG Abd Portable 1V  Result Date: 05/21/2019 CLINICAL DATA:  Abdominal distension EXAM: PORTABLE ABDOMEN - 1 VIEW COMPARISON:  05/16/2019 FINDINGS: Partially visualized intracardiac pacing leads. Bilateral lower lung interstitial and ground-glass opacity. Feeding tube tip overlies the junction of second and third portion of duodenum. Mild air-filled colon without distended bowel. IMPRESSION: 1. Mild air-filled colon without small bowel distension. No definitive obstructive pattern. 2. Feeding tube tip over the proximal duodenum Electronically Signed   By: Donavan Foil M.D.   On: 05/21/2019 19:04   DG Abd Portable 1V  Result Date: 05/16/2019 CLINICAL DATA:  69 year old male with enteric tube  placement. EXAM: PORTABLE ABDOMEN -  1 VIEW; PORTABLE CHEST - 1 VIEW COMPARISON:  Chest radiograph dated 05/14/2019. FINDINGS: Endotracheal tube with tip approximately 4.5 cm above the carina. Enteric tube extends into the left upper abdomen with tip in the proximal stomach and side-port just distal to the GE junction. Bilateral airspace opacities primarily involving the mid to lower lung fields again noted. No large pleural effusion or pneumothorax. Stable cardiac silhouette. Left pectoral pacemaker device. No bowel dilatation or evidence of obstruction. The osseous structures and soft tissues are grossly unremarkable. IMPRESSION: 1. Endotracheal tube above the carina. 2. Enteric tube with tip in the proximal stomach and side-port just distal to the GE junction. 3. Bilateral airspace opacities as seen previously. Electronically Signed   By: Anner Crete M.D.   On: 05/16/2019 02:49   VAS [place] LOWER EXTREMITY VENOUS (DVT)  Result Date: 05/11/2019  Lower Venous Study Indications: D dimer.  Performing Technologist: Abram Sander RVS  Examination Guidelines: A complete evaluation includes B-mode imaging, spectral Doppler, color Doppler, and power Doppler as needed of all accessible portions of each vessel. Bilateral testing is considered an integral part of a complete examination. Limited examinations for reoccurring indications may be performed as noted.  +---------+---------------+---------+-----------+----------+-----------------+ RIGHT    CompressibilityPhasicitySpontaneityPropertiesThrombus Aging    +---------+---------------+---------+-----------+----------+-----------------+ CFV      Full           Yes      Yes                                    +---------+---------------+---------+-----------+----------+-----------------+ SFJ      Full                                                           +---------+---------------+---------+-----------+----------+-----------------+ FV Prox  Full                                                            +---------+---------------+---------+-----------+----------+-----------------+ FV Mid   Full                                                           +---------+---------------+---------+-----------+----------+-----------------+ FV DistalNone                                         Age Indeterminate +---------+---------------+---------+-----------+----------+-----------------+ PFV      Full                                                           +---------+---------------+---------+-----------+----------+-----------------+ POP      Full           Yes  Yes                                    +---------+---------------+---------+-----------+----------+-----------------+ PTV      None                                         Age Indeterminate +---------+---------------+---------+-----------+----------+-----------------+ PERO                                                  Not visualized    +---------+---------------+---------+-----------+----------+-----------------+   +---------+---------------+---------+-----------+----------+--------------+ LEFT     CompressibilityPhasicitySpontaneityPropertiesThrombus Aging +---------+---------------+---------+-----------+----------+--------------+ CFV      Full           Yes      Yes                                 +---------+---------------+---------+-----------+----------+--------------+ SFJ      Full                                                        +---------+---------------+---------+-----------+----------+--------------+ FV Prox  Full                                                        +---------+---------------+---------+-----------+----------+--------------+ FV Mid   Full                                                        +---------+---------------+---------+-----------+----------+--------------+ FV DistalFull                                                         +---------+---------------+---------+-----------+----------+--------------+ PFV      Full                                                        +---------+---------------+---------+-----------+----------+--------------+ POP      Full           Yes      Yes                                 +---------+---------------+---------+-----------+----------+--------------+ PTV      Full                                                        +---------+---------------+---------+-----------+----------+--------------+  PERO                                                  Not visualized +---------+---------------+---------+-----------+----------+--------------+     Summary: Right: Findings consistent with age indeterminate deep vein thrombosis involving the right femoral vein, and right posterior tibial veins. No cystic structure found in the popliteal fossa. Left: There is no evidence of deep vein thrombosis in the lower extremity. No cystic structure found in the popliteal fossa.  *See table(s) above for measurements and observations. Electronically signed by Ruta Hinds MD on 05/11/2019 at 4:43:42 PM.    Final     Microbiology Recent Results (from the past 240 hour(s))  Culture, respiratory (non-expectorated)     Status: None   Collection Time: 05/17/19  2:11 PM   Specimen: Tracheal Aspirate; Respiratory  Result Value Ref Range Status   Specimen Description   Final    TRACHEAL ASPIRATE Performed at [facility] [address], [place], [place] [address]    Special Requests   Final    Immunocompromised Performed at [facility], [place] [address] [place], [place] [address]    Gram Stain   Final    RARE WBC PRESENT,BOTH PMN AND MONONUCLEAR RARE GRAM POSITIVE COCCI IN PAIRS Performed at [facility], [place] [address], [place], [place] [address]    Culture MODERATE CANDIDA ALBICANS  Final    Report Status 05/21/2019 FINAL  Final  Culture, blood (routine x 2)     Status: None   Collection Time: 05/17/19  3:30 PM   Specimen: BLOOD RIGHT ARM  Result Value Ref Range Status   Specimen Description BLOOD RIGHT ARM  Final   Special Requests   Final    Immunocompromised Performed at [facility], [place] [address] [place], [place] [address]    Culture NO GROWTH 7 DAYS  Final   Report Status 05/24/2019 FINAL  Final  Culture, blood (routine x 2)     Status: None   Collection Time: 05/17/19  3:35 PM   Specimen: BLOOD  Result Value Ref Range Status   Specimen Description   Final    BLOOD RIGHT ANTECUBITAL Performed at [facility], [place] [address] [place], [place] [address]    Special Requests   Final    Immunocompromised Performed at [facility], [place] [address], [place], [place] [address]    Culture NO GROWTH 7 DAYS  Final   Report Status 05/24/2019 FINAL  Final  Culture, bal-quantitative     Status: Abnormal   Collection Time: 05/20/19  1:00 PM   Specimen: Bronchoalveolar Lavage; Respiratory  Result Value Ref Range Status   Specimen Description   Final    BRONCHIAL ALVEOLAR LAVAGE Performed at [facility] [address] [place], [place] [address]    Special Requests   Final    NONE Performed at [facility], [place] [address], [place], [place] [address]    Gram Stain   Final    FEW WBC PRESENT, PREDOMINANTLY PMN NO ORGANISMS SEEN Performed at [facility], [place] [address] [place], [place] [address]    Culture 6,000 COLONIES/mL CANDIDA ALBICANS (A)  Final   Report Status 05/23/2019 FINAL  Final    Lab Basic Metabolic Panel: Recent Labs  Lab 05/19/19  1440 05/19/19 1440 05/19/19 2110 05/20/19 0059 05/20/19 0445 05/20/19 2019 05/21/19 0440 05/21/19 1436 05/21/19 2146 06-17-19 0450 2019-06-17 1223  NA 143   < > 143   < > 138   < > 142 142 143 144 140  K 5.9*   < > 5.5*    < > 5.1   < > 5.3* 4.8 4.4 4.6 5.3*  CL 110  --  109  --  105  --  104  --   --  102  --   CO2 24  --  24  --  25  --  28  --   --  29  --   GLUCOSE 189*  --  154*  --  160*  --  370*  --   --  182*  --   BUN 125*  --  140*  --  139*  --  162*  --   --  140*  --   CREATININE 2.45*  --  2.56*  --  2.53*  --  3.68*  --   --  3.43*  --   CALCIUM 7.6*  --  7.5*  --  7.4*  --  7.3*  --   --  7.3*  --   MG  --   --   --   --   --   --   --   --   --  3.0*  --    < > = values in this interval not displayed.   Liver Function Tests: Recent Labs  Lab 05/19/19 0509 2019/06/17 0450  AST 40 43*  ALT 37 42  ALKPHOS 161* 160*  BILITOT 0.7 0.5  PROT 4.6* 4.7*  ALBUMIN 2.4* 2.3*   No results for input(s): LIPASE, AMYLASE in the last 168 hours. No results for input(s): AMMONIA in the last 168 hours. CBC: Recent Labs  Lab 05/19/19 0509 05/19/19 0509 05/19/19 1440 05/20/19 0059 05/20/19 0445 05/20/19 2019 05/21/19 0440 05/21/19 1225 05/21/19 1436 05/21/19 2146 06/17/19 0450 17-Jun-2019 1223  WBC 19.4*  --  14.0*  --  16.5*  --  14.9*  --   --   --  12.5*  --   NEUTROABS 13.3*  --   --   --  11.5*  --  10.4*  --   --   --  9.2*  --   HGB 12.6*   < > 11.3*   < > 12.1*   < > 10.0*  --  11.2* 10.2* 9.8* 9.2*  HCT 41.0   < > 36.6*   < > 39.2   < > 32.9*  --  33.0* 30.0* 31.9* 27.0*  MCV 101.0*  --  99.5  --  99.7  --  100.6*  --   --   --  100.0  --   PLT 60*   < > 54*  --  51*  --  36* 33*  --   --  53*  --    < > = values in this interval not displayed.   Cardiac Enzymes: No results for input(s): CKTOTAL, CKMB, CKMBINDEX, TROPONINI in the last 168 hours. Sepsis Labs: Recent Labs  Lab 05/19/19 1440 05/20/19 0445 05/20/19 0500 05/21/19 0440 June 17, 2019 0450  PROCALCITON  --   --  0.76  --   --   WBC 14.0* 16.5*  --  14.9* 12.5*    Procedures/Operations     Simonne Maffucci 05/25/2019, 9:29 PM

## 2019-06-08 NOTE — Procedures (Signed)
Arterial Line Insertion Procedure Note Max Lozano [id number] 1950-10-14  Procedure: Insertion of Arterial Line Indications: Frequent blood sampling BP monitoring  Procedure Details Consent: Unable to obtain consent because of emergent medical necessity. Time Out: Verified patient identification, verified procedure, site/side was marked, verified correct patient position, special equipment/implants available, medications/allergies/relevent history reviewed, required imaging and test results available.  Performed  Maximum sterile technique was used including antiseptics, cap, gloves, gown, hand hygiene, mask and sheet. Skin prep: Chlorhexidine; local anesthetic administered A single lumen arterial catheter was placed in the L radial artery using the Seldinger technique.  Ultrasound was used to verify the patency of the vein and for real time needle guidance.  Evaluation Blood flow good Complications: No apparent complications Patient did tolerate procedure well.  Max Fickle 06/02/2019, 1:37 PM

## 2019-06-08 NOTE — Progress Notes (Signed)
[facility]  He has now been pulseless for over 5 minutes.  I have ordered vasopressors to stop.  He has some electrical activity on telemetry associated wit his pacemaker.  Dilaudid, versed infusions are going for comfort.  I ordered palliative extubation.  Heber [place], MD [facility] Pager: [phone] Cell: [phone] If no response, call [phone]

## 2019-06-08 NOTE — Progress Notes (Signed)
Orders received for palliative extubation.  Extubated to room air.

## 2019-06-08 NOTE — Progress Notes (Signed)
Pt began experiencing AF with RVR. BP stable initially and SaO2 decreasing and sustaining in mid 80s.Marland Kitchen Respiratory at bedside to evaluate but pt receiving maximal vent support. Dr. Kendrick Fries notified at 1200 and plans made to cardiovert due to pt beginning to experience hypotension. Once Dr. Kendrick Fries arrived moments later, pt had converted to NSR and BP was stable. Worsening hypoxia ensued. Breath sounds were clear and diminished in R lung and a high pitched sound was noted on inspiration of L lung. STAT CXR ordered by Dr. Kendrick Fries. Pt began to experience worsening hypotension and placed on Levophed drip. Pt received Bicarb, Epi, CaCl, D50 IVP. Aline placed. Placed on Epi drip at 20 mcg/min, Levophed drip at 70 mcg.min. Pt bronched, U/S of chest completed by Dr. Kendrick Fries. Family notified of worsening condition despite medical intervention. Pt continued to receive Dilaudid infusion at 4 mg/hr and Versed infusion at 10 mg/hr to keep comfortable. Nursing/RT staff at bedside to provide support to pt as death was imminent. Time of death was 1342. Call made to pt's wife to offer support and obtain information regarding funeral home. Post mortem care completed. Belongings placed with pt to be transported to the morgue.

## 2019-06-08 DEATH — deceased

## 2020-11-11 IMAGING — DX DG CHEST 1V PORT
1 series · 1 of 1 positions shown · non-contrast
Comparison: 05/07/2019

CLINICAL DATA: [id number]

EXAM:
PORTABLE CHEST 1 VIEW

[chest]
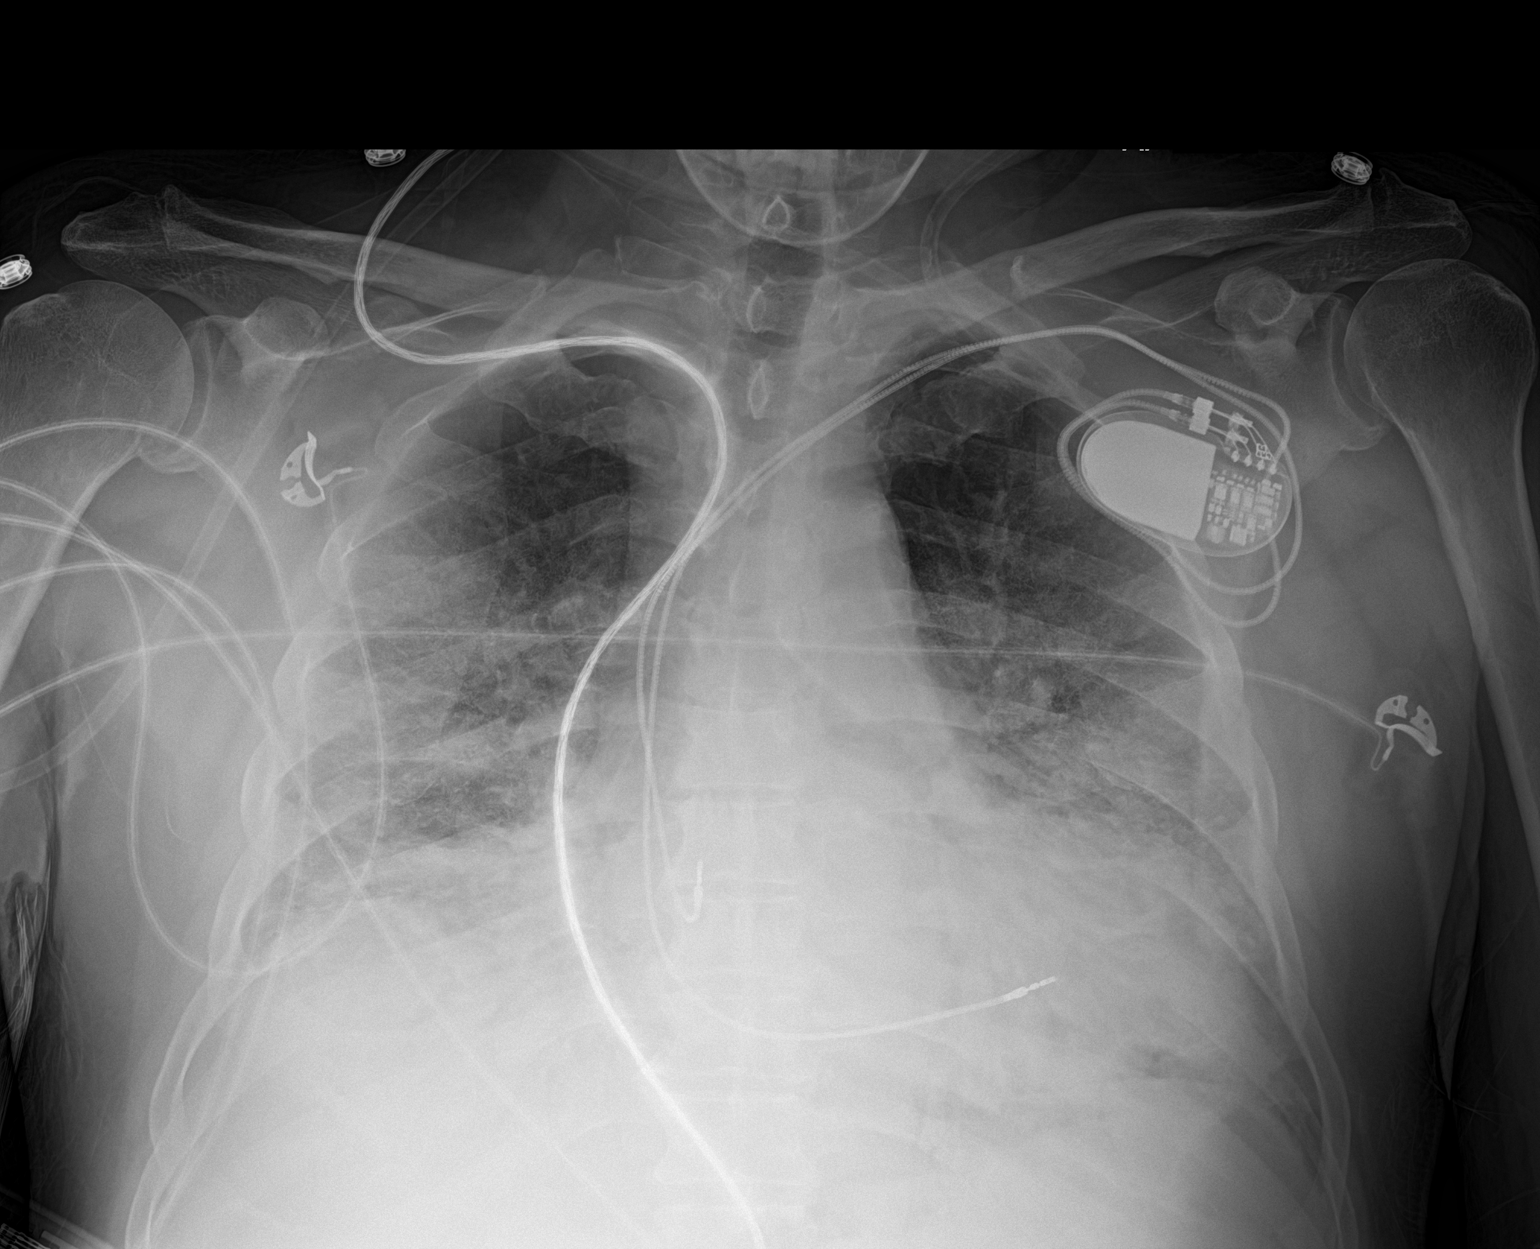

[1 of 1 positions shown; findings below may reference images not displayed]

FINDINGS: Similar elevation of the right hemidiaphragm. Persistent bilateral
opacities with relative sparing of the upper lungs. Aeration has
worsened since the prior study. No significant pleural effusion. No
pneumothorax. Similar cardiomediastinal contours. Left chest wall
pacemaker is again noted.
IMPRESSION: Multifocal pneumonia with worsening aeration since 05/07/2019.

## 2020-11-16 IMAGING — DX DG CHEST 1V PORT
1 series · 1 of 1 positions shown · non-contrast
Comparison: 05/18/2019.

CLINICAL DATA: Acute respiratory failure.  Hypoxia.

EXAM:
PORTABLE CHEST 1 VIEW

[chest ap]
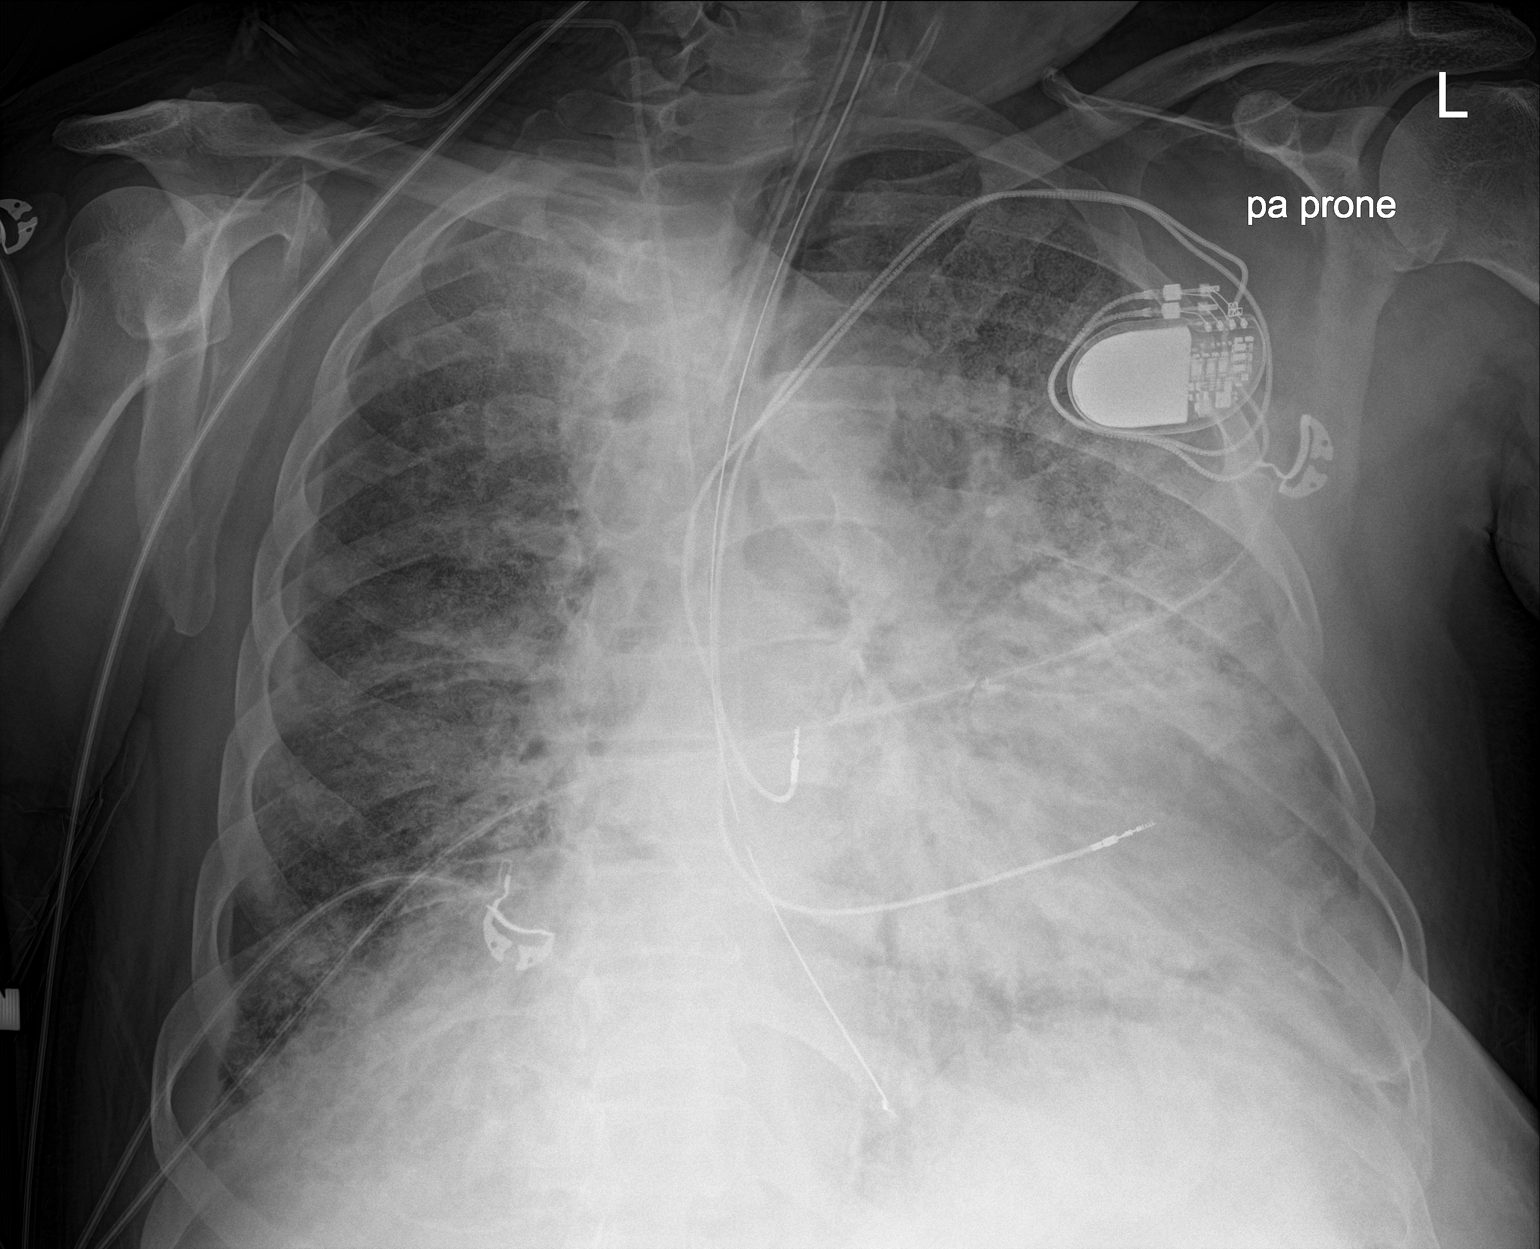

[1 of 1 positions shown; findings below may reference images not displayed]

FINDINGS: Endotracheal tube and right IJ line in stable position. NG tube tip
again noted above the left hemidiaphragm. Advancement of
approximately 15 cm should be considered. Cardiac pacer in stable
position. Heart size stable. Diffuse progressive bilateral pulmonary
infiltrates. Small left pleural effusion cannot be excluded. No
pneumothorax.
IMPRESSION: 1. NG tube tip noted above left hemidiaphragm. Advancement of
approximately 15 cm should be considered. Endotracheal tube and
right IJ line stable position.

2.  Cardiac pacer stable position.  Heart size stable.

3. Diffuse progressive bilateral pulmonary infiltrates. Small left
pleural effusion cannot be excluded.

## 2020-11-18 IMAGING — DX DG CHEST 1V PORT
1 series · 1 of 1 positions shown · non-contrast
Comparison: 05/21/2019

CLINICAL DATA: Placement of central venous catheter

EXAM:
PORTABLE CHEST 1 VIEW

[chest ap]
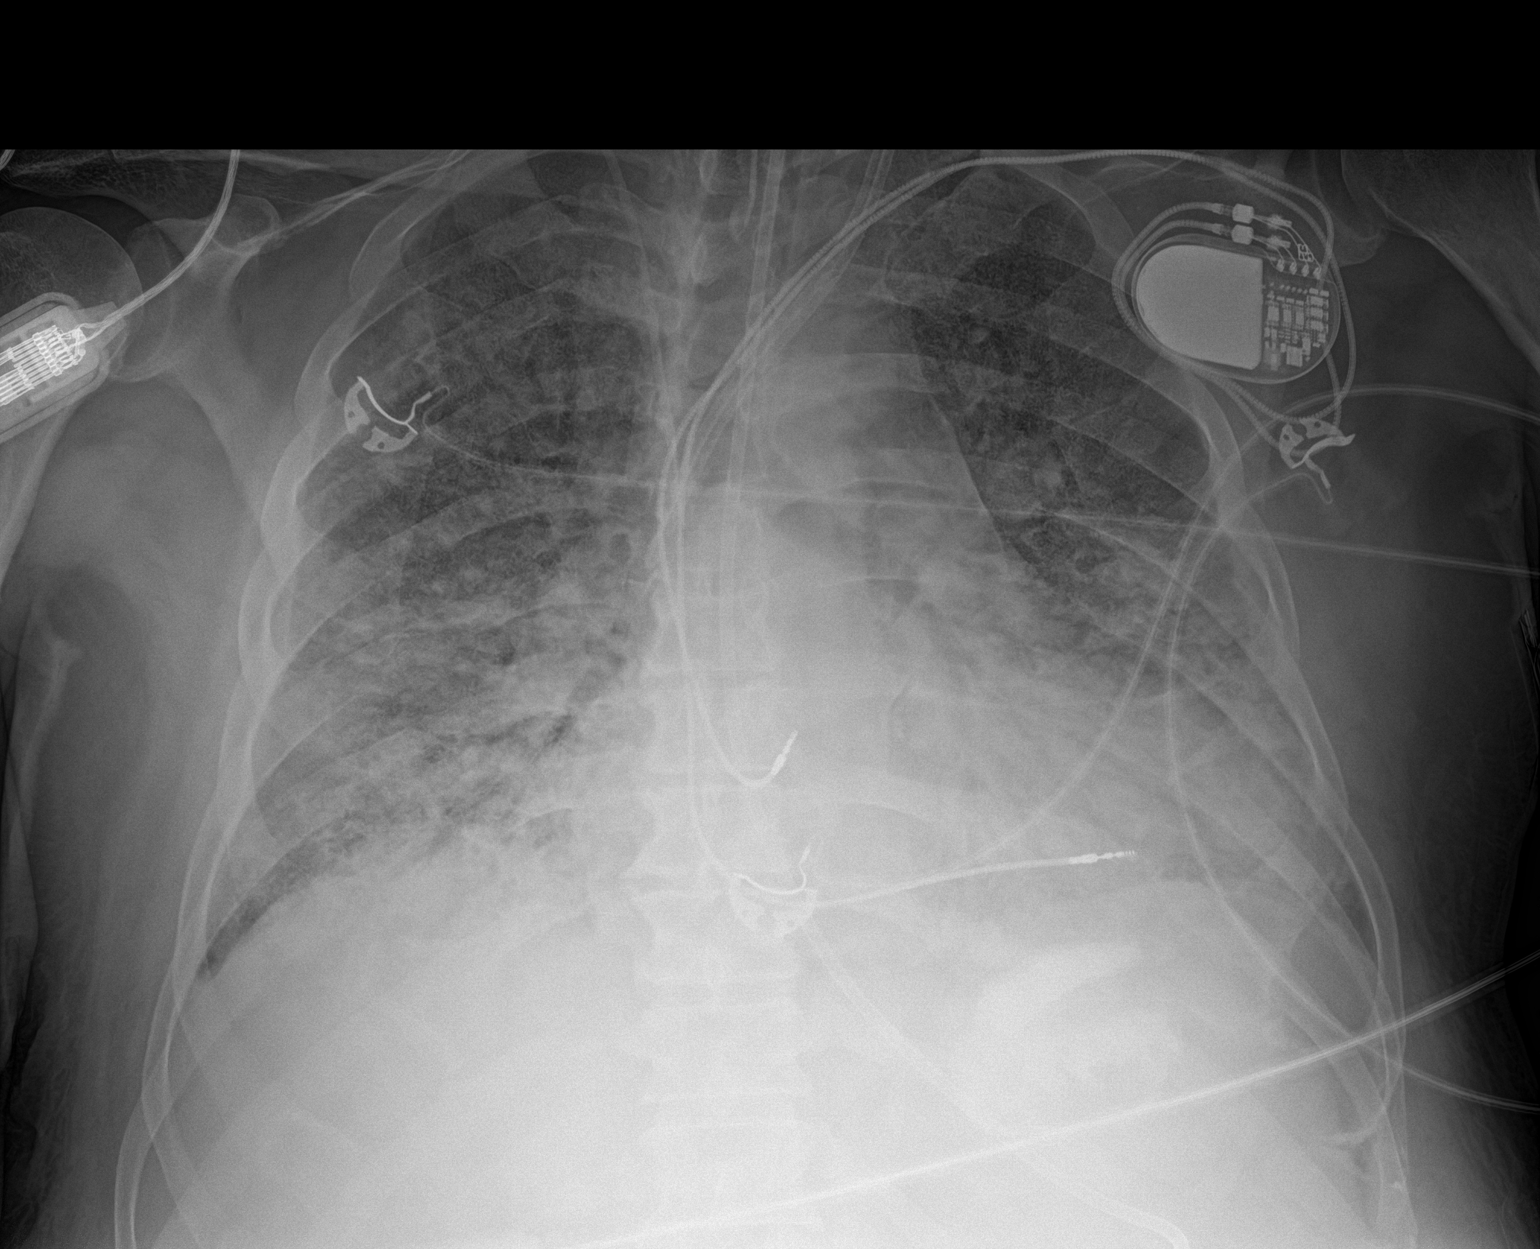

[1 of 1 positions shown; findings below may reference images not displayed]

FINDINGS: Interval placement of left internal jugular Vas-Cath with the tip in
the SVC. Right central line, endotracheal tube and feeding tube as
well as left pacer remain in place, unchanged. Diffuse bilateral
airspace disease again noted. Cardiomegaly. No visible effusions or
pneumothorax.
IMPRESSION: Left Vas-Cath placement with the tip in the SVC.  No pneumothorax.

Stable support devices otherwise.

Severe diffuse bilateral airspace disease, unchanged.

## 2020-11-19 IMAGING — US US RENAL
1 series · 14 of 25 positions shown · non-contrast
Comparison: None.

CLINICAL DATA: Acute kidney injury.

EXAM:
RENAL / URINARY TRACT ULTRASOUND COMPLETE

[Series 1: us renal · 0.28mm/px · 14 of 26 slices shown]
[im 1/26]
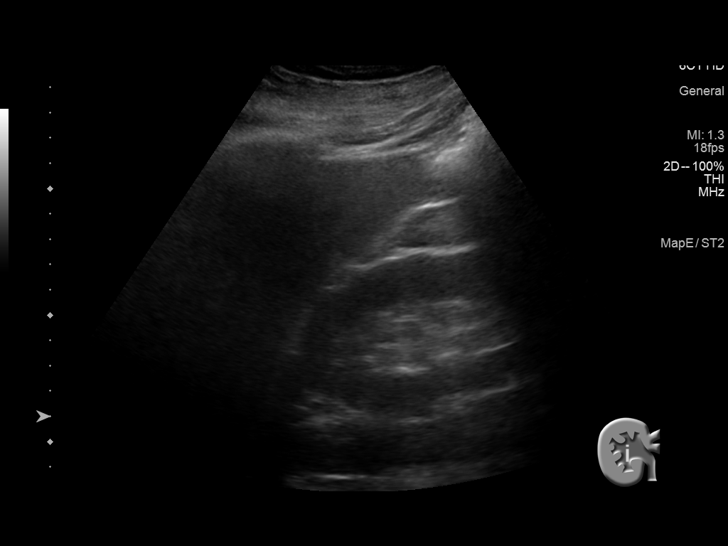
[im 3/26]
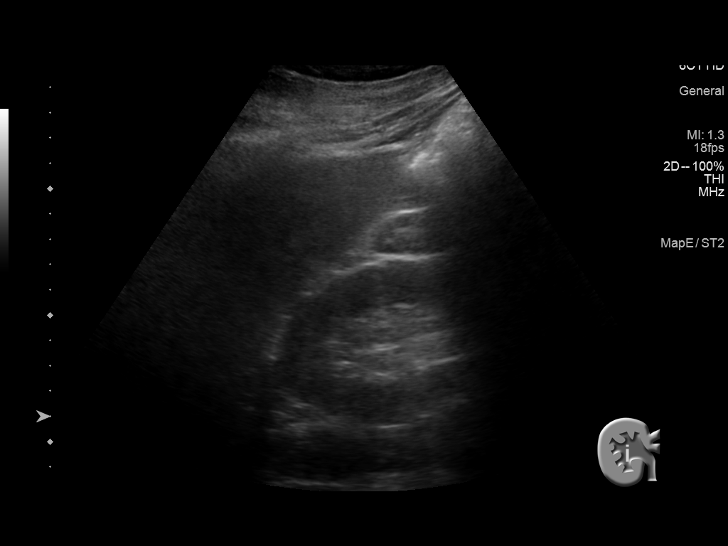
[im 5/26]
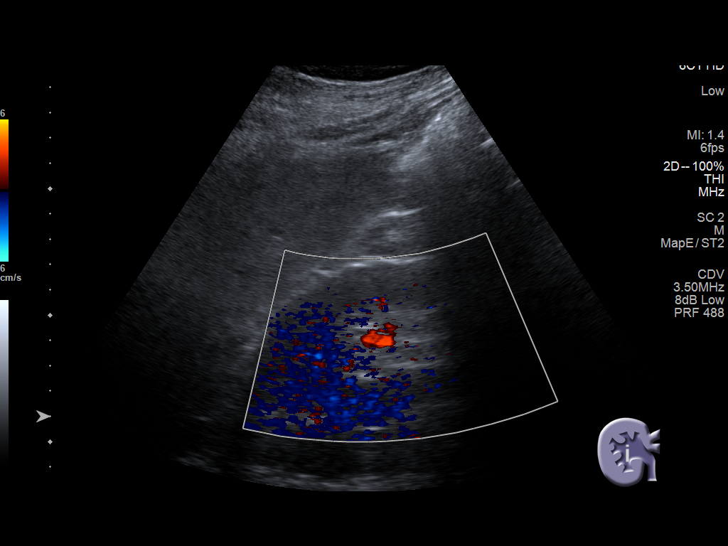
[im 7/26]
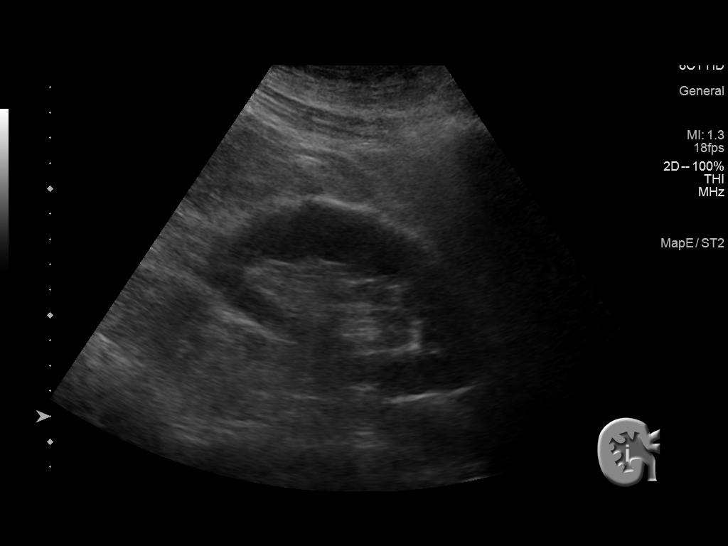
[im 9/26]
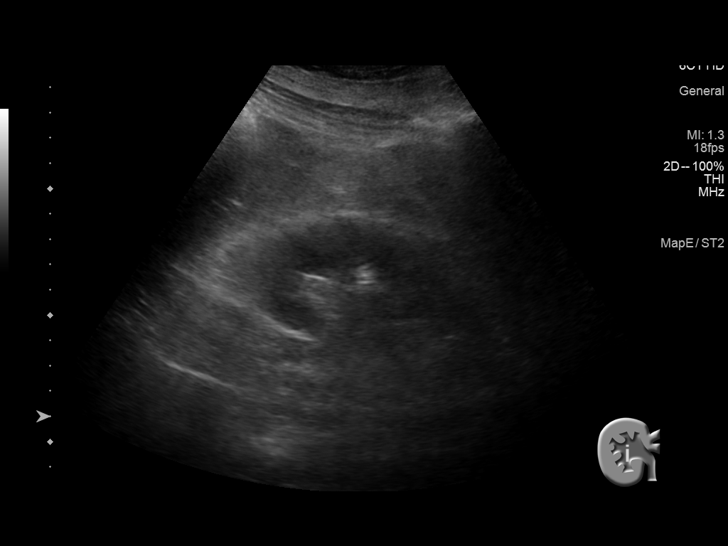
[im 10/26]
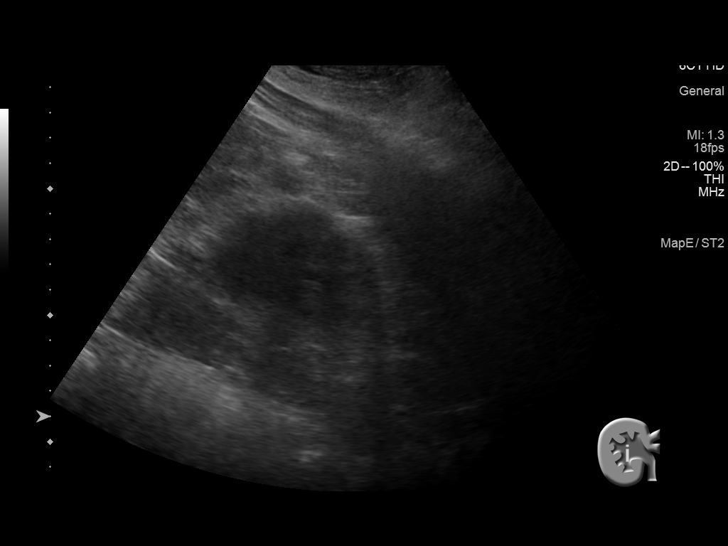
[im 12/26]
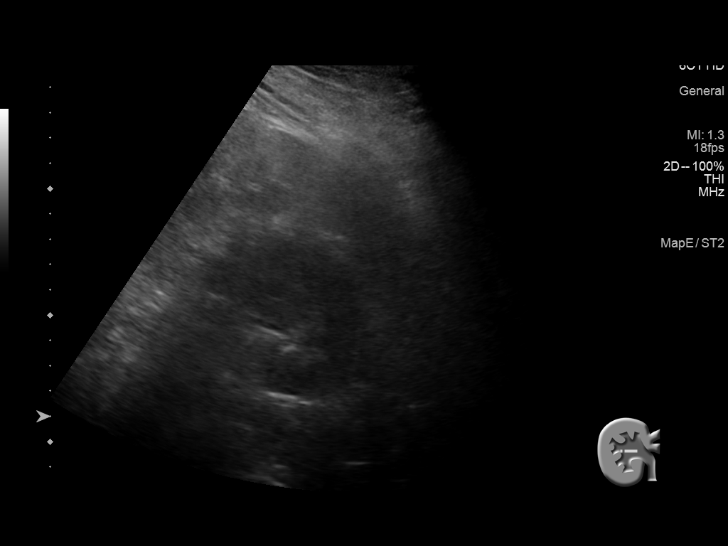
[im 14/26]
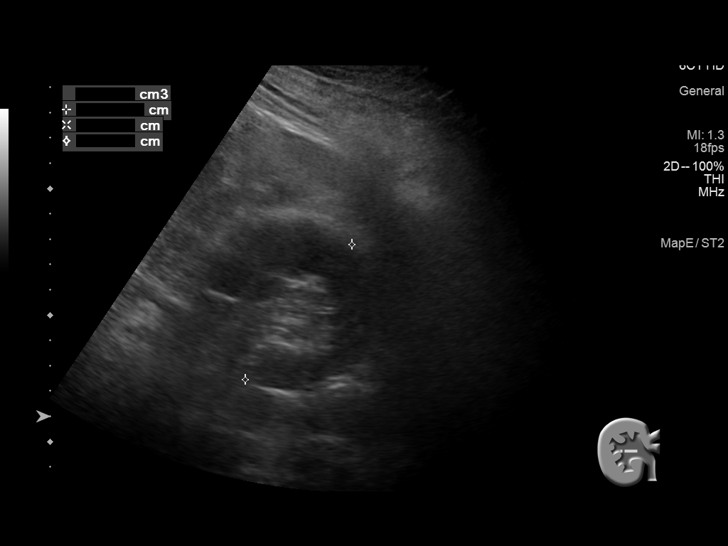
[im 16/26]
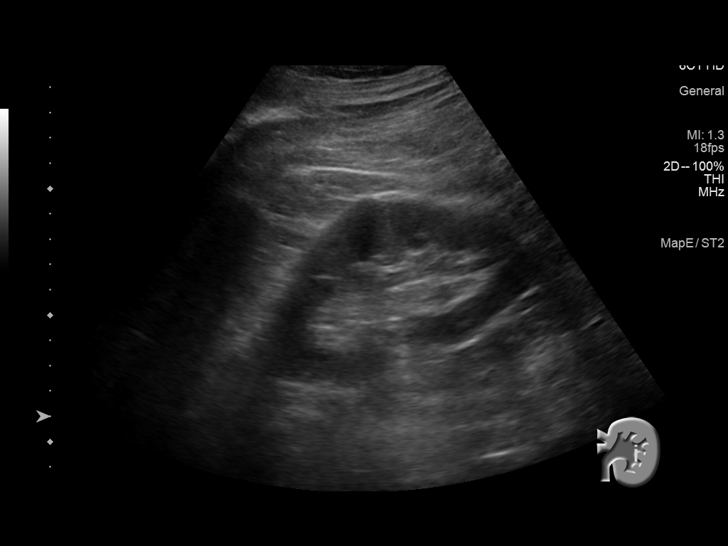
[im 17/26]
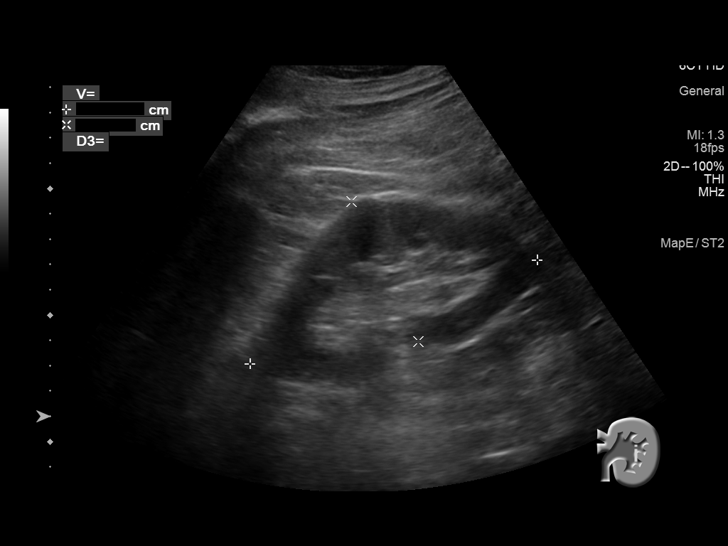
[im 19/26]
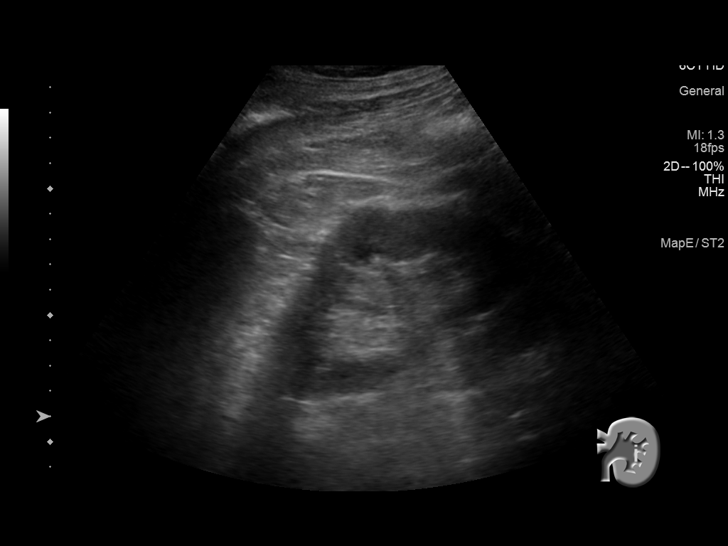
[im 21/26]
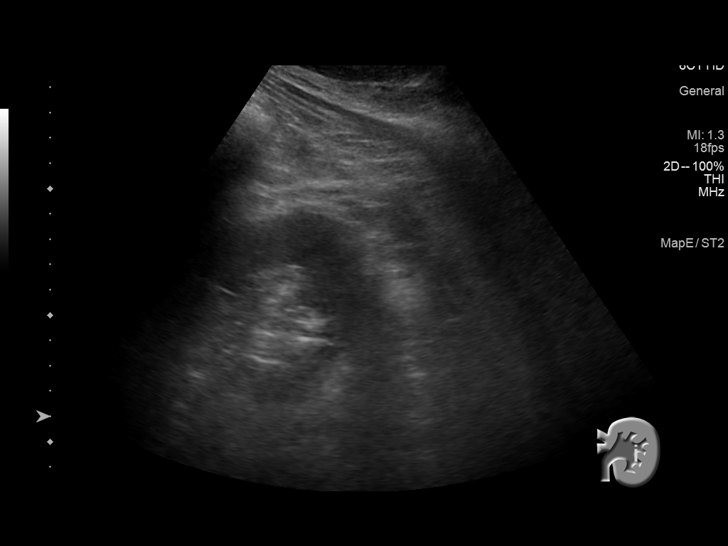
[im 23/26]
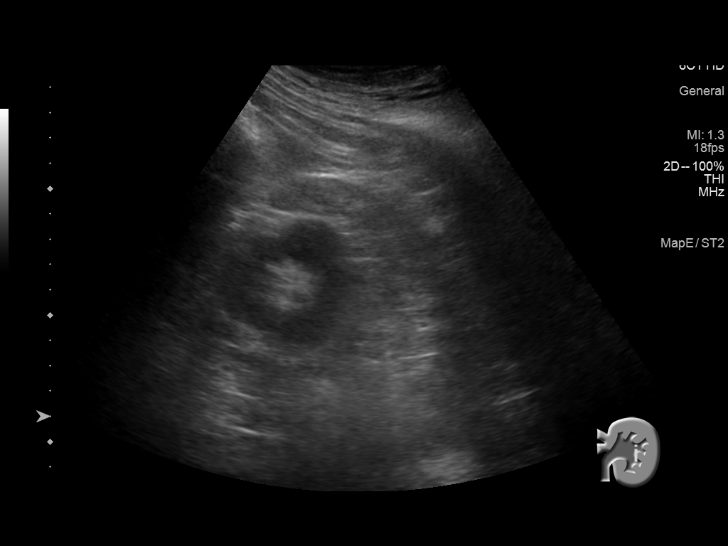
[im 26/26]
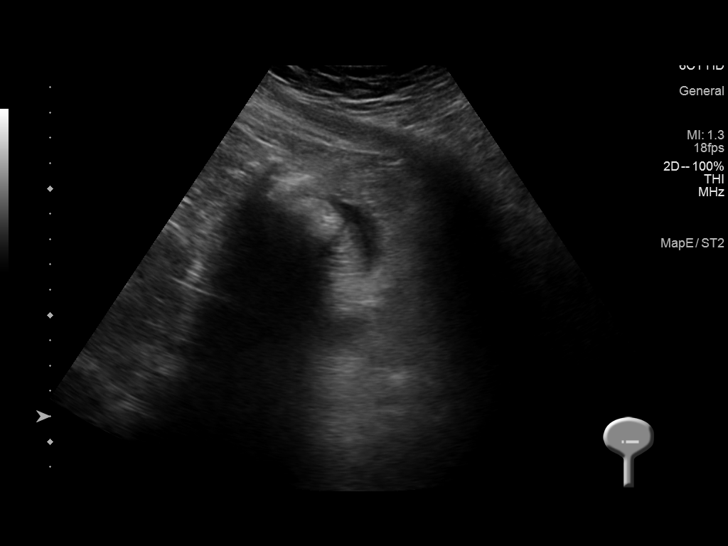

[14 of 25 positions shown; findings below may reference images not displayed]

FINDINGS: Right Kidney:

Renal measurements: 11.9 x 6.0 x 6.8 cm = volume: 252.3 mL .
Echogenicity within normal limits. No mass or hydronephrosis
visualized.

Left Kidney:

Renal measurements: 12.1 x 6.1 x 7.2 cm = volume: 276.8 mL.
Echogenicity within normal limits. No mass or hydronephrosis
visualized.

Bladder:

Bladder decompressed.

Other:

None.
IMPRESSION: 1. Normal renal ultrasound.
